# Patient Record
Sex: Female | Born: 1950 | Race: White | Hispanic: No | Smoking: Never smoker
Health system: Southern US, Community
[De-identification: ages and names within clinical notes are randomized; demographics above are authoritative.]

## PROBLEM LIST (undated history)

## (undated) DIAGNOSIS — D241 Benign neoplasm of right breast: Secondary | ICD-10-CM

## (undated) DIAGNOSIS — M25569 Pain in unspecified knee: Secondary | ICD-10-CM

## (undated) DIAGNOSIS — I480 Paroxysmal atrial fibrillation: Secondary | ICD-10-CM

## (undated) DIAGNOSIS — F341 Dysthymic disorder: Secondary | ICD-10-CM

## (undated) DIAGNOSIS — G43909 Migraine, unspecified, not intractable, without status migrainosus: Secondary | ICD-10-CM

## (undated) DIAGNOSIS — D649 Anemia, unspecified: Secondary | ICD-10-CM

## (undated) DIAGNOSIS — N390 Urinary tract infection, site not specified: Secondary | ICD-10-CM

## (undated) DIAGNOSIS — R002 Palpitations: Secondary | ICD-10-CM

## (undated) DIAGNOSIS — Z9889 Other specified postprocedural states: Secondary | ICD-10-CM

## (undated) DIAGNOSIS — R112 Nausea with vomiting, unspecified: Secondary | ICD-10-CM

## (undated) DIAGNOSIS — E785 Hyperlipidemia, unspecified: Secondary | ICD-10-CM

## (undated) DIAGNOSIS — I10 Essential (primary) hypertension: Secondary | ICD-10-CM

## (undated) DIAGNOSIS — K219 Gastro-esophageal reflux disease without esophagitis: Secondary | ICD-10-CM

## (undated) HISTORY — DX: Migraine, unspecified, not intractable, without status migrainosus: G43.909

## (undated) HISTORY — DX: Anemia, unspecified: D64.9

## (undated) HISTORY — DX: Pain in unspecified knee: M25.569

## (undated) HISTORY — DX: Gastro-esophageal reflux disease without esophagitis: K21.9

## (undated) HISTORY — DX: Essential (primary) hypertension: I10

## (undated) HISTORY — DX: Paroxysmal atrial fibrillation: I48.0

## (undated) HISTORY — DX: Urinary tract infection, site not specified: N39.0

## (undated) HISTORY — DX: Dysthymic disorder: F34.1

## (undated) HISTORY — DX: Hyperlipidemia, unspecified: E78.5

---
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
## 1977-06-12 HISTORY — PX: OTHER SURGICAL HISTORY: SHX169

## 1995-06-13 HISTORY — PX: ABDOMINAL HYSTERECTOMY: SHX81

## 2002-10-17 ENCOUNTER — Emergency Department (HOSPITAL_COMMUNITY): Admission: EM | Admit: 2002-10-17 | Discharge: 2002-10-17 | Payer: Self-pay | Admitting: Emergency Medicine

## 2003-04-14 ENCOUNTER — Ambulatory Visit (HOSPITAL_COMMUNITY): Admission: RE | Admit: 2003-04-14 | Discharge: 2003-04-14 | Payer: Self-pay | Admitting: Podiatry

## 2003-04-14 ENCOUNTER — Ambulatory Visit (HOSPITAL_BASED_OUTPATIENT_CLINIC_OR_DEPARTMENT_OTHER): Admission: RE | Admit: 2003-04-14 | Discharge: 2003-04-14 | Payer: Self-pay | Admitting: Podiatry

## 2003-06-26 ENCOUNTER — Ambulatory Visit (HOSPITAL_BASED_OUTPATIENT_CLINIC_OR_DEPARTMENT_OTHER): Admission: RE | Admit: 2003-06-26 | Discharge: 2003-06-26 | Payer: Self-pay | Admitting: Podiatry

## 2003-08-12 ENCOUNTER — Ambulatory Visit (HOSPITAL_COMMUNITY): Admission: RE | Admit: 2003-08-12 | Discharge: 2003-08-12 | Payer: Self-pay | Admitting: Podiatry

## 2003-08-12 ENCOUNTER — Ambulatory Visit (HOSPITAL_BASED_OUTPATIENT_CLINIC_OR_DEPARTMENT_OTHER): Admission: RE | Admit: 2003-08-12 | Discharge: 2003-08-12 | Payer: Self-pay | Admitting: Podiatry

## 2003-10-10 ENCOUNTER — Encounter: Payer: Self-pay | Admitting: Emergency Medicine

## 2003-10-10 ENCOUNTER — Emergency Department (HOSPITAL_COMMUNITY): Admission: EM | Admit: 2003-10-10 | Discharge: 2003-10-10 | Payer: Self-pay | Admitting: Emergency Medicine

## 2003-12-22 ENCOUNTER — Ambulatory Visit (HOSPITAL_BASED_OUTPATIENT_CLINIC_OR_DEPARTMENT_OTHER): Admission: RE | Admit: 2003-12-22 | Discharge: 2003-12-22 | Payer: Self-pay | Admitting: Podiatry

## 2009-02-18 ENCOUNTER — Observation Stay (HOSPITAL_COMMUNITY): Admission: EM | Admit: 2009-02-18 | Discharge: 2009-02-18 | Payer: Self-pay | Admitting: Emergency Medicine

## 2009-02-21 ENCOUNTER — Emergency Department (HOSPITAL_COMMUNITY): Admission: EM | Admit: 2009-02-21 | Discharge: 2009-02-22 | Payer: Self-pay | Admitting: Emergency Medicine

## 2009-04-14 ENCOUNTER — Ambulatory Visit: Payer: Self-pay | Admitting: Internal Medicine

## 2009-04-14 DIAGNOSIS — F341 Dysthymic disorder: Secondary | ICD-10-CM | POA: Insufficient documentation

## 2009-04-14 DIAGNOSIS — I4891 Unspecified atrial fibrillation: Secondary | ICD-10-CM | POA: Insufficient documentation

## 2009-04-14 HISTORY — DX: Dysthymic disorder: F34.1

## 2009-04-19 ENCOUNTER — Ambulatory Visit: Payer: Self-pay

## 2009-04-19 ENCOUNTER — Ambulatory Visit (HOSPITAL_COMMUNITY): Admission: RE | Admit: 2009-04-19 | Discharge: 2009-04-19 | Payer: Self-pay | Admitting: Internal Medicine

## 2009-04-19 ENCOUNTER — Encounter: Payer: Self-pay | Admitting: Internal Medicine

## 2009-04-19 ENCOUNTER — Ambulatory Visit: Payer: Self-pay | Admitting: Cardiology

## 2009-04-28 ENCOUNTER — Ambulatory Visit: Payer: Self-pay | Admitting: Internal Medicine

## 2009-04-28 LAB — CONVERTED CEMR LAB
BUN: 14 mg/dL (ref 6–23)
CO2: 29 meq/L (ref 19–32)
Calcium: 9 mg/dL (ref 8.4–10.5)
Chloride: 108 meq/L (ref 96–112)
GFR calc non Af Amer: 68.8 mL/min (ref >60)
Glucose, Bld: 100 mg/dL — ABNORMAL HIGH (ref 70–99)
Potassium: 4.1 meq/L (ref 3.5–5.1)
Sodium: 144 meq/L (ref 135–145)

## 2009-05-04 ENCOUNTER — Telehealth: Payer: Self-pay | Admitting: Internal Medicine

## 2009-06-30 ENCOUNTER — Telehealth: Payer: Self-pay | Admitting: Internal Medicine

## 2009-07-02 ENCOUNTER — Ambulatory Visit: Payer: Self-pay | Admitting: Internal Medicine

## 2009-07-02 DIAGNOSIS — G43909 Migraine, unspecified, not intractable, without status migrainosus: Secondary | ICD-10-CM | POA: Insufficient documentation

## 2009-07-02 DIAGNOSIS — I10 Essential (primary) hypertension: Secondary | ICD-10-CM

## 2009-07-02 HISTORY — DX: Essential (primary) hypertension: I10

## 2009-07-02 HISTORY — DX: Migraine, unspecified, not intractable, without status migrainosus: G43.909

## 2009-07-02 LAB — CONVERTED CEMR LAB
Protein, U semiquant: NEGATIVE
Specific Gravity, Urine: <1.005
Urobilinogen, UA: 0.2

## 2009-07-05 ENCOUNTER — Telehealth: Payer: Self-pay | Admitting: Internal Medicine

## 2009-07-19 ENCOUNTER — Telehealth: Payer: Self-pay | Admitting: Internal Medicine

## 2009-09-07 ENCOUNTER — Ambulatory Visit: Payer: Self-pay | Admitting: Internal Medicine

## 2009-09-07 DIAGNOSIS — K219 Gastro-esophageal reflux disease without esophagitis: Secondary | ICD-10-CM

## 2009-09-07 DIAGNOSIS — E785 Hyperlipidemia, unspecified: Secondary | ICD-10-CM

## 2009-09-07 HISTORY — DX: Gastro-esophageal reflux disease without esophagitis: K21.9

## 2009-09-07 HISTORY — DX: Hyperlipidemia, unspecified: E78.5

## 2009-09-07 LAB — CONVERTED CEMR LAB
Basophils Absolute: 0.1 10*3/uL (ref 0.0–0.1)
Basophils Relative: 0.9 % (ref 0.0–3.0)
Bilirubin Urine: NEGATIVE
Cholesterol: 307 mg/dL — ABNORMAL HIGH (ref 0–200)
Direct LDL: 224.5 mg/dL
Eosinophils Absolute: 0.2 10*3/uL (ref 0.0–0.7)
Eosinophils Relative: 2.6 % (ref 0.0–5.0)
HCT: 39.6 % (ref 36.0–46.0)
HDL: 57.3 mg/dL (ref >39.00)
MCHC: 33 g/dL (ref 30.0–36.0)
Monocytes Relative: 7.7 % (ref 3.0–12.0)
Neutro Abs: 3.4 10*3/uL (ref 1.4–7.7)
Neutrophils Relative %: 45.3 % (ref 43.0–77.0)
TSH: 2.01 microintl units/mL (ref 0.35–5.50)
Total CHOL/HDL Ratio: 5
Triglycerides: 196 mg/dL — ABNORMAL HIGH (ref 0.0–149.0)
Urobilinogen, UA: 0.2
VLDL: 39.2 mg/dL (ref 0.0–40.0)
pH: 5

## 2009-12-07 ENCOUNTER — Ambulatory Visit: Payer: Self-pay | Admitting: Internal Medicine

## 2009-12-07 DIAGNOSIS — M25569 Pain in unspecified knee: Secondary | ICD-10-CM

## 2009-12-07 HISTORY — DX: Pain in unspecified knee: M25.569

## 2009-12-07 LAB — CONVERTED CEMR LAB
Albumin: 4.3 g/dL (ref 3.5–5.2)
Alkaline Phosphatase: 107 units/L (ref 39–117)
Bilirubin Urine: NEGATIVE
Blood in Urine, dipstick: NEGATIVE
Direct LDL: 118.9 mg/dL
HDL: 47 mg/dL (ref >39.00)
Nitrite: NEGATIVE
Total CHOL/HDL Ratio: 4
VLDL: 43 mg/dL — ABNORMAL HIGH (ref 0.0–40.0)

## 2009-12-22 ENCOUNTER — Encounter: Payer: Self-pay | Admitting: Internal Medicine

## 2010-03-06 ENCOUNTER — Emergency Department (HOSPITAL_COMMUNITY)
Admission: EM | Admit: 2010-03-06 | Discharge: 2010-03-06 | Payer: Self-pay | Source: Home / Self Care | Admitting: Emergency Medicine

## 2010-07-03 ENCOUNTER — Encounter: Payer: Self-pay | Admitting: Orthopaedic Surgery

## 2010-07-12 NOTE — Letter (Signed)
Summary: [facility]  [facility]   Imported By: Lester Foster Brook 12/27/2009 10:49:44  _____________________________________________________________________  External Attachment:    Type:   Image     Comment:   External Document

## 2010-07-12 NOTE — Progress Notes (Signed)
Summary: Benicar HCT PA  Phone Note From Pharmacy   Caller: [facility] [phone] Call For: ID:  [phone]  Summary of Call: PA request--Benicar HCT. Covered alternatives are Losartan, Micardis, and Diovan. Would you like to change med or initiate PA? Initial call taken by: Lucious Groves,  July 19, 2009 8:28 AM  Follow-up for Phone Call        ok to change to Diovan HCT- see med list - but i did not e-rx new rx -- may send to pt pharmacy as needed - thanks Follow-up by: Newt Lukes MD,  July 19, 2009 9:32 AM    New/Updated Medications: DIOVAN HCT 160-12.5 MG TABS (VALSARTAN-HYDROCHLOROTHIAZIDE) 1 by mouth once daily Prescriptions: DIOVAN HCT 160-12.5 MG TABS (VALSARTAN-HYDROCHLOROTHIAZIDE) 1 by mouth once daily  #30 x 3   Entered and Authorized by:   Newt Lukes MD   Signed by:   Newt Lukes MD on 07/19/2009   Method used:   [facility]        [facility] [address] [phone]* (retail)       [address]       [place], [place]  [address]       Ph: [phone] or [phone]       Fax: [phone]   RxID:   [id number]   Appended Document: Benicar HCT PA Left message on voicemail to call back to office.  Appended Document: Benicar HCT PA Patient notified.

## 2010-07-12 NOTE — Assessment & Plan Note (Signed)
Summary: NEW [employer] PT--PER NIVIDA/DR KLEIN--HYPERTENSION-PKG/OFF-BP:154...   Vital Signs:  Patient profile:   60 year old female Height:      67 inches (170.18 cm) Weight:      194.12 pounds (88.24 kg) O2 Sat:      97 % on Room air Temp:     99.7 degrees F (37.61 degrees C) oral Pulse rate:   62 / minute BP sitting:   152 / 98  (left arm) Cuff size:   large  Vitals Entered By: Orlan Leavens (July 02, 2009 11:05 AM)  O2 Flow:  Room air CC: New patient/ Hypertension/ also pt complainingof UTI sxs/ also want flu shot Is Patient Diabetic? No Pain Assessment Patient in pain? no        Primary Care Provider:  Newt Lukes MD  CC:  New patient/ Hypertension/ also pt complainingof UTI sxs/ also want flu shot.  History of Present Illness: new pt to me and our division - here to est care  1) HTN - new dx -  on medications-- but for these migraine tx, not specifically for high BP or PAF no HA or CP, no edema - but ++FH same  2) depression symptoms -  prev on effexor but stopped 12mos ago (indep, self-direcetd) counselling re-initiated recently for same symptoms - ms. b anthony describes as feeling sad, tearfulness, no energy -  worse with stressors related to separtation and divorce trials  3) concerned about poss UTI symptoms described as urgency, frequency and dysuria - +hx same - aprox 2-3/year -  hydrating self -  no fever, abd pain or flank pain no hx stones  Clinical Review Panels:  Complete Metabolic Panel   Glucose:  100 (04/28/2009)   Sodium:  144 (04/28/2009)   Potassium:  4.1 (04/28/2009)   Chloride:  108 (04/28/2009)   CO2:  29 (04/28/2009)   BUN:  14 (04/28/2009)   Creatinine:  0.9 (04/28/2009)   Calcium:  9.0 (04/28/2009)   Current Medications (verified): 1)  Imitrex 100 Mg Tabs (Sumatriptan Succinate) .... As Needed 2)  Inderal La 120 Mg Xr24h-Cap (Propranolol Hcl) .... Take One Capsule With An 80 Mg Cap 3)  Inderal La 80 Mg Xr24h-Cap  (Propranolol Hcl) .... Take One Capsule Daily With A 120 Mg Capsule 4)  Lotensin 20 Mg Tabs (Benazepril Hcl) .... Take One Tablet Daily 5)  Benazepril Hcl 20 Mg Tabs (Benazepril Hcl) .... Once Daily  Allergies (verified): 1)  ! Demerol 2)  ! Codeine 3)  ! Morphine  Past History:  Past Medical History: Migraine headaches Paroxsymal atrial fibrillation hypertension UTIs Allergies Anxiety/depression  Past Surgical History: Reviewed history from 04/14/2009 and no changes required. appendectomy Hysterectomy Septorhinoplasty  Family History: Reviewed history from 04/14/2009 and no changes required. Family History of Cancer:  hypertension - both parents, paternal side  Social History: Divorced since 2004 - but ongoing legal struggles related to same retired Char[employer] fundraiser, former CCU experience Tobacco Use - No.  Alcohol Use - yes Regular Exercise - no  Review of Systems       see HPI above. I have reviewed all other systems and they were negative.   Physical Exam  General:  alert, well-developed, well-nourished, and cooperative to examination.    Eyes:  vision grossly intact; pupils equal, round and reactive to light.  conjunctiva and lids normal.    Ears:  normal pinnae bilaterally, without erythema, swelling, or tenderness to palpation. TMs clear, without effusion, or cerumen impaction. Hearing grossly normal bilaterally  Mouth:  teeth and gums in good repair; mucous membranes moist, without lesions or ulcers. oropharynx clear without exudate, no erythema.  Lungs:  normal respiratory effort, no intercostal retractions or use of accessory muscles; normal breath sounds bilaterally - no crackles and no wheezes.    Heart:  normal rate, regular rhythm, no murmur, and no rub. BLE without edema. Abdomen:  soft, non-tender, normal bowel sounds, no distention; no masses and no appreciable hepatomegaly or splenomegaly.   Neurologic:  alert & oriented X3 and cranial nerves II-XII symetrically  intact.  strength normal in all extremities, sensation intact to light touch, and gait normal. speech fluent without dysarthria or aphasia; follows commands with good comprehension.  Skin:  no rashes, vesicles, ulcers, or erythema. No nodules or irregularity to palpation.  Psych:  Oriented X3, memory intact for recent and remote, normally interactive, good eye contact, mildly anxious appearing, mildly depressed appearing, but not agitated.      Impression & Recommendations:  Problem # 1:  UTI (ICD-599.0)  +UA, hx same - will tx with cipro now - prev tx with septa with good results - if >3x/yr, send for Ucx and consider uro/gyn eval Orders: UA Dipstick w/o Micro (manual) (28413) Prescription Created Electronically [phone])  Her updated medication list for this problem includes:    Ciprofloxacin Hcl 500 Mg Tabs (Ciprofloxacin hcl) .Marland Kitchen... 1 by mouth two times a day x 7days  Encouraged to push clear liquids, get enough rest, and take acetaminophen as needed.   Problem # 2:  HYPERTENSION (ICD-401.9)  will add norvasc to current tx - noted hx PAF with RVR so cont Bbloc as ongoing for migraines despite psyc symptoms - f/u 6-8 weeks to cont titration and adjustments as needed  The following medications were removed from the medication list:    Benazepril Hcl 20 Mg Tabs (Benazepril hcl) ..... Once daily Her updated medication list for this problem includes:    Inderal La 120 Mg Xr24h-cap (Propranolol hcl) .Marland Kitchen... Take one capsule with an 80 mg cap    Inderal La 80 Mg Xr24h-cap (Propranolol hcl) .Marland Kitchen... Take one capsule daily with a 120 mg capsule    Lotensin 20 Mg Tabs (Benazepril hcl) .Marland Kitchen... Take one tablet daily    Amlodipine Besylate 5 Mg Tabs (Amlodipine besylate) .Marland Kitchen... 1 by mouth once daily  BP today: 152/98 Prior BP: 180/104 (04/14/2009)  Labs Reviewed: K+: 4.1 (04/28/2009) Creat: : 0.9 (04/28/2009)     Problem # 3:  ANXIETY DEPRESSION (ICD-300.4) resume effexor at this time - prev  good control of symptoms on same - risk/benefit and poss SE reviewed - exac by inc in social stressors with legal battle ongoing with exspouse -  cont counseling as re-initiated -  no SI/HI - to call if worse before next visit  Time spent with patient 45 minutes, more than 50% of this time was spent counseling patient on depression and different medical ways to treat her problems (HTN, depression, UTI) as well as need for followup to cont med adjustments ongoing  Complete Medication List: 1)  Imitrex 100 Mg Tabs (Sumatriptan succinate) .... As needed 2)  Inderal La 120 Mg Xr24h-cap (Propranolol hcl) .... Take one capsule with an 80 mg cap 3)  Inderal La 80 Mg Xr24h-cap (Propranolol hcl) .... Take one capsule daily with a 120 mg capsule 4)  Lotensin 20 Mg Tabs (Benazepril hcl) .... Take one tablet daily 5)  Effexor Xr 37.5 Mg Xr24h-cap (Venlafaxine hcl) .Marland Kitchen.. 1 by mouth once daily x  14 days, 2 by mouth once daily  (or as direcetd) 6)  Amlodipine Besylate 5 Mg Tabs (Amlodipine besylate) .Marland Kitchen.. 1 by mouth once daily 7)  Ciprofloxacin Hcl 500 Mg Tabs (Ciprofloxacin hcl) .Marland Kitchen.. 1 by mouth two times a day x 7days  Other Orders: Admin 1st Vaccine ([address]) Flu Vaccine 34yrs + (60454) Flu Vaccine Consent Questions     Do you have a history of severe allergic reactions to this vaccine? no    Any prior history of allergic reactions to egg and/or gelatin? no    Do you have a sensitivity to the preservative Thimersol? no    Do you have a past history of Guillan-Barre Syndrome? no    Do you currently have an acute febrile illness? no    Have you ever had a severe reaction to latex? no    Vaccine information given and explained to patient? yes    Are you currently pregnant? no    Lot Number:[id number]   Exp Date:12/09/2009   Site Given  Left Deltoid IM Flu Vaccine 59yrs + (09811)  Patient Instructions: 1)  it was good to see you today.  2)  new medications today - amlodipine for blood pressure, effexor  for depression and cipro for bladder infection - your prescriptions have been electronically submitted to your pharmacy. Please take as directed. Contact our office if you believe you're having problems with the medication(s).  3)  Please schedule a follow-up appointment in 6-8 weeks, sooner if problems.  come fasting so we can do cholesterol labs Prescriptions: CIPROFLOXACIN HCL 500 MG TABS (CIPROFLOXACIN HCL) 1 by mouth two times a day x 7days  #14 x 0   Entered and Authorized by:   Newt Lukes MD   Signed by:   Newt Lukes MD on 07/02/2009   Method used:   [facility]        [facility] [address] [phone]* (retail)       [address]       [place], [place]  [address]       Ph: [phone] or [phone]       Fax: [phone]   RxID:   [id number] AMLODIPINE BESYLATE 5 MG TABS (AMLODIPINE BESYLATE) 1 by mouth once daily  #30 x 4   Entered and Authorized by:   Newt Lukes MD   Signed by:   Newt Lukes MD on 07/02/2009   Method used:   [facility] to        [facility] [address] [phone]* (retail)       [address]       [place], [place]  [address]       Ph: [phone] or [phone]       Fax: [phone]   RxID:   [id number] EFFEXOR XR 37.5 MG XR24H-CAP (VENLAFAXINE HCL) 1 by mouth once daily x 14 days, 2 by mouth once daily  (or as direcetd)  #60 x 1   Entered and Authorized by:   Newt Lukes MD   Signed by:   Newt Lukes MD on 07/02/2009   Method used:   [facility] to        [facility] [address] [phone]* (retail)       [address]       [place], [place]  [address]  Ph: [phone] or [phone]       Fax: [phone]   RxID:   [id number]  .lbflu   Colonoscopy  Procedure date:  06/12/2006  Findings:      Pt states done @ [facility] gastroenterology Results: Normal   Laboratory Results   Urine Tests    Routine  Urinalysis   Color: yellow Appearance: Clear Glucose: negative   (Normal Range: Negative) Bilirubin: negative   (Normal Range: Negative) Ketone: negative   (Normal Range: Negative) Spec. Gravity: <1.005   (Normal Range: 1.003-1.035) Blood: moderate   (Normal Range: Negative) pH: 5.0   (Normal Range: 5.0-8.0) Protein: negative   (Normal Range: Negative) Urobilinogen: 0.2   (Normal Range: 0-1) Nitrite: negative   (Normal Range: Negative) Leukocyte Esterace: moderate   (Normal Range: Negative)

## 2010-07-12 NOTE — Progress Notes (Signed)
Summary: HA?  Phone Note Call from Patient Call back at Home Phone [phone]   Caller: Patient Summary of Call: pt called stating that she has been having a constant HA since starting Norvasc. Pt says that she tried a similar medication a few years ago that caused the same side effect. pt is requesting alt med Initial call taken by: Margaret Pyle, CMA,  July 05, 2009 4:14 PM  Follow-up for Phone Call        will change to benicar hct - e-rx done  stop amlodipine at this time-  thanks Follow-up by: Newt Lukes MD,  July 05, 2009 4:26 PM  Additional Follow-up for Phone Call Additional follow up Details #1::        pt informed via VM. told to call back with any further questions or concerns. Additional Follow-up by: Margaret Pyle, CMA,  July 06, 2009 8:19 AM    New/Updated Medications: BENICAR HCT 40-12.5 MG TABS (OLMESARTAN MEDOXOMIL-HCTZ) 1 by mouth once daily Prescriptions: BENICAR HCT 40-12.5 MG TABS (OLMESARTAN MEDOXOMIL-HCTZ) 1 by mouth once daily  #30 x 3   Entered and Authorized by:   Newt Lukes MD   Signed by:   Newt Lukes MD on 07/05/2009   Method used:   [facility]ronically to        [facility] [address] [phone]* (retail)       [address]       [place], [place]  [address]       Ph: [phone] or [phone]       Fax: [phone]   RxID:   [id number]

## 2010-07-12 NOTE — Assessment & Plan Note (Signed)
Summary: 3-6 MTH FU--STC   Vital Signs:  Patient profile:   60 year old female Height:      67 inches (170.18 cm) Weight:      204.12 pounds (92.78 kg) O2 Sat:      97 % on Room air Temp:     97.9 degrees F (36.61 degrees C) oral Pulse rate:   65 / minute BP sitting:   110 / 76  (left arm) Cuff size:   large  Vitals Entered By: Orlan Leavens (December 07, 2009 9:06 AM)  O2 Flow:  Room air CC: 3 month follow-up Is Patient Diabetic? No Comments Pt states she think she have UTI. want urine check. also discuss decrease energy levels. Been having some tremors ? anxiety/stress. also want to discuss effexor and prilosec   Primary Care Provider:  Newt Lukes MD  CC:  3 month follow-up.  History of Present Illness: here for 6 week f/u  1) HTN - new dx - new meds begun 07/2009 for same on medications-- but for these migraine tx, not specifically for high BP or PAF no HA or CP, no edema - but ++FH same  2) depression symptoms -  back on effexor - no adv se of same counselling re-initiated for same symptoms - ms. b anthony - but pt stopped going  describes as feeling sad, tearfulness, no energy -  worse with stressors related to separtation and divorce trials feels tremor related to untx anxiety - ?inc med  3) concerned about poss UTI symptoms described as urgency, frequency and dysuria - +hx same - aprox 2-3/year -  hydrating self -  no fever, abd pain or flank pain no hx stones  4)  dyslipidemia - med tx initialted 08/2009 after review of FLP for same- reports compliance with ongoing medical treatment and no changes in medication dose or frequency. denies adverse side effects related to current therapy. no muscle or GI problems  5) r knee pain - onset 2.5 mos ago - precipitated by working on treadmill - initally swollen and "popped" - now no swelling but continued pain with twisting motion -   Clinical Review Panels:  Lipid Management   Cholesterol:  307 (09/07/2009)  HDL (good cholesterol):  57.30 (09/07/2009)  CBC   WBC:  7.6 (09/07/2009)   RBC:  4.58 (09/07/2009)   Hgb:  13.1 (09/07/2009)   Hct:  39.6 (09/07/2009)   Platelets:  244.0 (09/07/2009)   MCV  86.3 (09/07/2009)   MCHC  33.0 (09/07/2009)   RDW  13.9 (09/07/2009)   PMN:  45.3 (09/07/2009)   Lymphs:  43.5 (09/07/2009)   Monos:  7.7 (09/07/2009)   Eosinophils:  2.6 (09/07/2009)   Basophil:  0.9 (09/07/2009)  Complete Metabolic Panel   Glucose:  100 (04/28/2009)   Sodium:  144 (04/28/2009)   Potassium:  4.1 (04/28/2009)   Chloride:  108 (04/28/2009)   CO2:  29 (04/28/2009)   BUN:  14 (04/28/2009)   Creatinine:  0.9 (04/28/2009)   Calcium:  9.0 (04/28/2009)   Current Medications (verified): 1)  Imitrex 100 Mg Tabs (Sumatriptan Succinate) .... As Needed 2)  Inderal La 120 Mg Xr24h-Cap (Propranolol Hcl) .... Take One Capsule With An 80 Mg Cap 3)  Inderal La 80 Mg Xr24h-Cap (Propranolol Hcl) .... Take One Capsule Daily With A 120 Mg Capsule 4)  Lotensin 20 Mg Tabs (Benazepril Hcl) .... Take One Tablet Daily 5)  Diovan Hct 160-12.5 Mg Tabs (Valsartan-Hydrochlorothiazide) .Marland Kitchen.. 1 By Mouth Once Daily 6)  Effexor Xr 150 Mg Xr24h-Cap (Venlafaxine Hcl) .Marland Kitchen.. 1 By Mouth Once Daily 7)  Prilosec Otc 20 Mg Tbec (Omeprazole Magnesium) .Marland Kitchen.. 1 By Mouth Once Daily X 14days, Then As Needed 8)  Lipitor 20 Mg Tabs (Atorvastatin Calcium) .... Take 1 At Bedtime  Allergies (verified): 1)  ! Demerol 2)  ! Codeine 3)  ! Morphine  Past History:  Past medical, surgical, family and social histories (including risk factors) reviewed, and no changes noted (except as noted below).  Past Medical History: Migraine headaches Paroxsymal atrial fibrillation hypertension UTIs Allergies Anxiety/depression  MD roster: cards - Graciela Husbands  Past Surgical History: Reviewed history from 04/14/2009 and no changes required. appendectomy Hysterectomy Septorhinoplasty  Family History: Reviewed history from  09/07/2009 and no changes required. Family History of Cancer:  hypertension - both parents, paternal side mom expired with supranuclear palsy complications  Social History: Reviewed history from 07/02/2009 and no changes required. 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  Alcohol Use - yes Regular Exercise - no  Review of Systems       see HPI above. I have reviewed all other systems and they were negative.   Physical Exam  General:  alert, well-developed, well-nourished, and cooperative to examination.    Lungs:  normal respiratory effort, no intercostal retractions or use of accessory muscles; normal breath sounds bilaterally - no crackles and no wheezes.    Heart:  normal rate, regular rhythm, no murmur, and no rub. BLE without edema. Msk:  right knee: full range of motion, no joint effusion or swelling. no erythema or abnormal warmth. Stable to ligamentous testing. Nontender to palpation. Neurovascularly intact.  Psych:  Oriented X3, memory intact for recent and remote, normally interactive, good eye contact, min anxious appearing, less depressed appearing, and not agitated.      Impression & Recommendations:  Problem # 1:  ANXIETY DEPRESSION (ICD-300.4)  tol effexor well - cont titration up - new e-rx done  exac by inc in social stressors with legal battle ongoing with exspouse -  counseling on hold at this time - no SI/HI - to call if worse before next visit  Time spent with patient 45 minutes, more than 50% of this time was spent counseling patient on depression and different medical ways to treat her problems (dyslipidemia, HTN, depression, UTI) as well as need for followup to cont med adjustments ongoing  Problem # 2:  HYPERLIPIDEMIA (ICD-272.4)  on meds since 08/2009 - recehck FLP and LFTs now to monitor effect of tx - Her updated medication list for this problem includes:    Lipitor 20 Mg  Tabs (Atorvastatin calcium) .Marland Kitchen... Take 1 at bedtime  Orders: TLB-Lipid Panel (80061-LIPID)    HDL:57.30 (09/07/2009)  Chol:307 (09/07/2009)  Trig:196.0 (09/07/2009)  Problem # 3:  KNEE PAIN, RIGHT (ICD-719.46)  exam benign - refer to ortho for further eval and tx same Orders: Orthopedic Surgeon Referral (Ortho Surgeon)  Problem # 4:  HYPERTENSION (ICD-401.9)  Her updated medication list for this problem includes:    Inderal La 120 Mg Xr24h-cap (Propranolol hcl) .Marland Kitchen... Take one capsule with an 80 mg cap    Inderal La 80 Mg Xr24h-cap (Propranolol hcl) .Marland Kitchen... Take one capsule daily with a 120 mg capsule    Lotensin 20 Mg Tabs (Benazepril hcl) .Marland Kitchen... Take one tablet daily    Diovan Hct 160-12.5 Mg Tabs (Valsartan-hydrochlorothiazide) .Marland Kitchen... 1 by mouth once daily  BP today: 110/76  Prior BP: 100/64 (09/07/2009)  Labs Reviewed: K+: 4.1 (04/28/2009) Creat: : 0.9 (04/28/2009)   Chol: 307 (09/07/2009)   HDL: 57.30 (09/07/2009)   TG: 196.0 (09/07/2009)  Complete Medication List: 1)  Imitrex 100 Mg Tabs (Sumatriptan succinate) .... As needed 2)  Inderal La 120 Mg Xr24h-cap (Propranolol hcl) .... Take one capsule with an 80 mg cap 3)  Inderal La 80 Mg Xr24h-cap (Propranolol hcl) .... Take one capsule daily with a 120 mg capsule 4)  Lotensin 20 Mg Tabs (Benazepril hcl) .... Take one tablet daily 5)  Diovan Hct 160-12.5 Mg Tabs (Valsartan-hydrochlorothiazide) .Marland Kitchen.. 1 by mouth once daily 6)  Venlafaxine Hcl 225 Mg Xr24h-tab (Venlafaxine hcl) .Marland Kitchen.. 1 by mouth once daily 7)  Prilosec Otc 20 Mg Tbec (Omeprazole magnesium) .Marland Kitchen.. 1 by mouth once daily x 14days, then as needed 8)  Lipitor 20 Mg Tabs (Atorvastatin calcium) .... Take 1 at bedtime  Other Orders: UA Dipstick w/o Micro (manual) ([address]) TLB-Hepatic/Liver Function Pnl (80076-HEPATIC)  Patient Instructions: 1)  it was good to see you today. 2)  test(s) ordered today - your results will be posted on the phone tree for review in 48-72 hours  from the time of test completion; call [phone] and enter your 9 digit MRN (listed above on this page, just below your name); if any changes need to be made or there are abnormal results, you will be contacted directly.  3)  increase in effexor dose as discussed - your prescription has been electronically submitted to your pharmacy. Please take as directed. Contact our office if you believe you're having problems with the medication(s).  4)  we'll make referral to orthopedics for your knee pain. Our office will contact you regarding this appointment once made.  5)  Please schedule a follow-up appointment in 3-47months, sooner if problems.  6)  stay hydrated! noncarbonated, non caffienated drinks - esp in this het and humidity! Prescriptions: VENLAFAXINE HCL 225 MG XR24H-TAB (VENLAFAXINE HCL) 1 by mouth once daily  #30 x 5   Entered and Authorized by:   Newt Lukes MD   Signed by:   Newt Lukes MD on 12/07/2009   Method used:   [facility] to        [facility] [address] [phone]* (retail)       [address]       [place], [place]  [address]       Ph: [phone] or [phone]       Fax: [phone]   RxID:   [id number]   Laboratory Results   Urine Tests    Routine Urinalysis   Color: yellow Appearance: Hazy Glucose: negative   (Normal Range: Negative) Bilirubin: negative   (Normal Range: Negative) Ketone: negative   (Normal Range: Negative) Spec. Gravity: 1.020   (Normal Range: 1.003-1.035) Blood: negative   (Normal Range: Negative) pH: 5.0   (Normal Range: 5.0-8.0) Protein: negative   (Normal Range: Negative) Urobilinogen: 0.2   (Normal Range: 0-1) Nitrite: negative   (Normal Range: Negative) Leukocyte Esterace: trace   (Normal Range: Negative)

## 2010-07-12 NOTE — Assessment & Plan Note (Signed)
Summary: 6 - 8 WK FU  STC   Vital Signs:  Patient profile:   60 year old Katelyn [name] Height:      67 inches (170.18 cm) Weight:      200.0 pounds (90.91 kg) O2 Sat:      95 % on Room air Temp:     98.9 degrees F (37.17 degrees C) oral Pulse rate:   60 / minute BP sitting:   100 / 64  (left arm) Cuff size:   large  Vitals Entered By: Katelyn Katelyn [name] (September 07, 2009 9:05 AM)  O2 Flow:  Room air CC: 6 week follow-up/ also req to have urine check starting backl having sxs of UTI Is Patient Diabetic? No Pain Assessment Patient in pain? no        Primary Care Provider:  Newt Lukes MD  CC:  6 week follow-up/ also req to have urine check starting backl having sxs of UTI.  History of Present Illness: here for 6 week f/u  1) HTN - new dx - new meds begun 6 weeks ago on medications-- but for these migraine tx, not specifically for high BP or PAF no HA or CP, no edema - but ++FH same  2) depression symptoms -  back on effexor counselling re-initiated recently for same symptoms - Katelyn Katelyn [name] describes as feeling sad, tearfulness, no energy -  worse with stressors related to separtation and divorce trials  3) concerned about poss UTI symptoms described as urgency, frequency and dysuria - +hx same - aprox 2-3/year -  hydrating self -  no fever, abd pain or flank pain no hx stones  4) also c/o congestion after any meal - no sneezing or allg symptoms -  +occ reflux but no cough or trouble swallowing - no CP or burn, occ "lump" in troat sticking even after meals but no regurg of foods  Clinical Review Panels:  Prevention   Last Colonoscopy:  Pt states done @ [facility] Results: Normal (06/12/2006)  Immunizations   Last Flu Vaccine:  Fluvax 3+ (07/02/2009)  Complete Metabolic Panel   Glucose:  100 (04/28/2009)   Sodium:  144 (04/28/2009)   Potassium:  4.1 (04/28/2009)   Chloride:  108 (04/28/2009)   CO2:  29 (04/28/2009)   BUN:  14 (04/28/2009)  Creatinine:  0.9 (04/28/2009)   Calcium:  9.0 (04/28/2009)   Current Medications (verified): 1)  Imitrex 100 Mg Tabs (Sumatriptan Succinate) .... As Needed 2)  Inderal La 120 Mg Xr24h-Cap (Propranolol Hcl) .... Take One Capsule With An 80 Mg Cap 3)  Inderal La 80 Mg Xr24h-Cap (Propranolol Hcl) .... Take One Capsule Daily With A 120 Mg Capsule 4)  Lotensin 20 Mg Tabs (Benazepril Hcl) .... Take One Tablet Daily 5)  Diovan Hct 160-12.5 Mg Tabs (Valsartan-Hydrochlorothiazide) .Marland Kitchen.. 1 By Mouth Once Daily 6)  Effexor Xr 75 Mg Xr24h-Cap (Venlafaxine Hcl) .... Take 1 By Mouth Qd  Allergies (verified): 1)  ! Demerol 2)  ! Codeine 3)  ! Morphine  Past History:  Past Medical History: Migraine headaches Paroxsymal atrial fibrillation hypertension UTIs Allergies Anxiety/depression  MD rooster: cards - klein  Family History: Family History of Cancer:  hypertension - both parents, paternal side mom expired with supranuclear palsy complications  Social History: Reviewed history from 07/02/2009 and no changes required. 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  Alcohol Use - yes Regular Exercise - no  Review of Systems  The patient denies chest pain, syncope, dyspnea on exertion, and peripheral edema.         tremor in UE in AM upon waking only, occ episodes 2-3/week - ?stress related - never during day, does not interfere with ADLs -   Physical Exam  General:  alert, well-developed, well-nourished, and cooperative to examination.    Lungs:  normal respiratory effort, no intercostal retractions or use of accessory muscles; normal breath sounds bilaterally - no crackles and no wheezes.    Heart:  normal rate, regular rhythm, no murmur, and no rub. BLE without edema. Abdomen:  soft, non-tender, normal bowel sounds, no distention; no masses and no appreciable hepatomegaly or splenomegaly.   Psych:  Oriented  X3, memory intact for recent and remote, normally interactive, good eye contact, min anxious appearing, less depressed appearing, and not agitated.      Impression & Recommendations:  Problem # 1:  HYPERTENSION (ICD-401.9) Assessment Improved  Her updated medication list for this problem includes:    Inderal La 120 Mg Xr24h-cap (Propranolol hcl) .Marland Kitchen... Take one capsule with an 80 mg cap    Inderal La 80 Mg Xr24h-cap (Propranolol hcl) .Marland Kitchen... Take one capsule daily with a 120 mg capsule    Lotensin 20 Mg Tabs (Benazepril hcl) .Marland Kitchen... Take one tablet daily    Diovan Hct 160-12.5 Mg Tabs (Valsartan-hydrochlorothiazide) .Marland Kitchen... 1 by mouth once daily  Orders: TLB-Lipid Panel (80061-LIPID)  BP today: 100/64 Prior BP: 152/98 (07/02/2009)  Labs Reviewed: K+: 4.1 (04/28/2009) Creat: : 0.9 (04/28/2009)     Problem # 2:  ANXIETY DEPRESSION (ICD-300.4) Assessment: Improved tol effexor well - cont titration up - new e-rx check labs r/o med dz contrib to symptoms  Orders: TLB-TSH (Thyroid Stimulating Hormone) (84443-TSH) TLB-CBC Platelet - w/Differential (85025-CBCD)  exac by inc in social stressors with legal battle ongoing with exspouse -  cont counseling as re-initiated -  no SI/HI - to call if worse before next visit  Time spent with patient 45 minutes, more than 50% of this time was spent counseling patient on depression and different medical ways to treat her problems (HTN, depression, UTI) as well as need for followup to cont med adjustments ongoing  Problem # 3:  UTI (ICD-599.0)  Her updated medication list for this problem includes:    Ciprofloxacin Hcl 500 Mg Tabs (Ciprofloxacin hcl) .Marland Kitchen... 1 by mouth two times a day x 7 days  Orders: UA Dipstick w/o Micro (manual) (40981) T-Culture, Urine ([id number])  +UA, hx same - send Ucx now to confirm infx will tx with cipro now - prev tx with septa with good results - if >3x/yr, consider uro/gyn eval - pt agrees (hx uro eval in 20s with  urethral dialation)  Encouraged to push clear liquids, get enough rest, and take acetaminophen as needed.   Problem # 4:  ATRIAL FIBRILLATION (ICD-427.31)  Her updated medication list for this problem includes:    Inderal La 120 Mg Xr24h-cap (Propranolol hcl) .Marland Kitchen... Take one capsule with an 80 mg cap    Inderal La 80 Mg Xr24h-cap (Propranolol hcl) .Marland Kitchen... Take one capsule daily with a 120 mg capsule  Problem # 5:  GERD (ICD-530.81)  Her updated medication list for this problem includes:    Prilosec Otc 20 Mg Tbec (Omeprazole magnesium) .Marland Kitchen... 1 by mouth once daily x 14days, then as needed  Complete Medication List: 1)  Imitrex 100 Mg Tabs (Sumatriptan succinate) .... As needed 2)  Inderal La 120 Mg  Xr24h-cap (Propranolol hcl) .... Take one capsule with an 80 mg cap 3)  Inderal La 80 Mg Xr24h-cap (Propranolol hcl) .... Take one capsule daily with a 120 mg capsule 4)  Lotensin 20 Mg Tabs (Benazepril hcl) .... Take one tablet daily 5)  Diovan Hct 160-12.5 Mg Tabs (Valsartan-hydrochlorothiazide) .Marland Kitchen.. 1 by mouth once daily 6)  Effexor Xr 150 Mg Xr24h-cap (Venlafaxine hcl) .Marland Kitchen.. 1 by mouth once daily 7)  Ciprofloxacin Hcl 500 Mg Tabs (Ciprofloxacin hcl) .Marland Kitchen.. 1 by mouth two times a day x 7 days 8)  Prilosec Otc 20 Mg Tbec (Omeprazole magnesium) .Marland Kitchen.. 1 by mouth once daily x 14days, then as needed  Patient Instructions: 1)  it was good to see you today. 2)  test(s) ordered today - your results will be posted on the phone tree for review in 48-72 hours from the time of test completion; call [phone] and enter your 9 digit MRN (listed above on this page, just below your name); if any changes need to be made or there are abnormal results, you will be contacted directly.  3)  will send your urine for cx to confirm or deny recurrent infection - treat emperically with 7 day cipro - if recurrent UTI, will refer to uro for re-evaluation - 4)  increase in effexor dose as discussed - 5)  trial Prilosec OTC  once daily x 14d, then as needed  6)  Please schedule a follow-up appointment in 3-52months, sooner if problems.  Prescriptions: CIPROFLOXACIN HCL 500 MG TABS (CIPROFLOXACIN HCL) 1 by mouth two times a day x 7 days  #14 x 0   Entered and Authorized by:   Katelyn Lukes MD   Signed by:   Katelyn Lukes MD on 09/07/2009   Method used:   Electronically to        [facility] [address] [phone]* (retail)       [address]       [place], [place]  [address]       Ph: [phone] or [phone]       Fax: [phone]   RxID:   [id number] EFFEXOR XR 150 MG XR24H-CAP (VENLAFAXINE HCL) 1 by mouth once daily  #30 x 5   Entered and Authorized by:   Katelyn Lukes MD   Signed by:   Katelyn Lukes MD on 09/07/2009   Method used:   [facility]ron[facility] to        [facility] [address] [phone]* (retail)       [address]       [place], [place]  [address]       Ph: [phone] or [phone]       Fax: [phone]   RxID:   [id number]   Laboratory Results   Urine Tests    Routine Urinalysis   Color: yellow Appearance: Hazy Glucose: negative   (Normal Range: Negative) Bilirubin: negative   (Normal Range: Negative) Ketone: negative   (Normal Range: Negative) Spec. Gravity: 1.025   (Normal Range: 1.003-1.035) Blood: moderate   (Normal Range: Negative) pH: 5.0   (Normal Range: 5.0-8.0) Protein: negative   (Normal Range: Negative) Urobilinogen: 0.2   (Normal Range: 0-1) Nitrite: negative   (Normal Range: Negative) Leukocyte Esterace: moderate   (Normal Range: Negative)

## 2010-07-12 NOTE — Progress Notes (Signed)
Summary:  elevated BP   Phone Note Call from Patient Call back at Home Phone [phone]   Caller: Patient Reason for Call: Talk to Nurse Summary of Call: elevated BP... the last few days, around  176/104 Initial call taken by: Migdalia Dk,  June 30, 2009 2:00 PM  Follow-up for Phone Call        Pt's BP has been over 90 diastolic since Nov. She has a lot of stress in her life and is going to see a counselor soon for antidepressent. Wants to know if she needs to change her meds or be seen or wait and see if the antidepressant works.  Duncan Dull, RN, BSN  June 30, 2009 3:23 PM   Additional Follow-up for Phone Call Additional follow up Details #1::        Per Dr. Graciela Husbands, pt should call PCP for BP management from now on. I will call her Caleen Essex when I return. Duncan Dull, RN, BSN  June 30, 2009 6:53 PM  Pt. called back. I let her know about MD's recomendations.  Pt. states she does not have a PCP. She States she is concern that she does not have a F/U appointment with Dr. Marikay Alar. She also added Dr. Graciela Husbands prescrived a HNT medication.  According to pt B/P has been 176/106 to 156/96 . An appointment was made for pt. to see Dr. Sanda Linger PCP on 07/05/09 at 2:45 PM. Pt. aware. She is okay with date and time. Additional Follow-up by: Ollen Gross, RN, BSN,  July 01, 2009 4:32 PM    Additional Follow-up for Phone Call Additional follow up Details #2::    pt calling back, request call back, Migdalia Dk  July 01, 2009 3:34 PM   Additional Follow-up for Phone Call Additional follow up Details #3:: Details for Additional Follow-up Action Taken:  Spoke with pt. See above note.  Additional Follow-up by: Ollen Gross, RN, BSN,  July 01, 2009 4:34 PM  New/Updated Medications: BENAZEPRIL HCL 20 MG TABS (BENAZEPRIL HCL) once daily

## 2010-08-25 LAB — URINALYSIS, ROUTINE W REFLEX MICROSCOPIC
Bilirubin Urine: NEGATIVE
Hgb urine dipstick: NEGATIVE

## 2010-08-25 LAB — CBC
HCT: 35.6 % — ABNORMAL LOW (ref 36.0–46.0)
MCH: 28.6 pg (ref 26.0–34.0)
MCHC: 33.5 g/dL (ref 30.0–36.0)
MCV: 85.2 fL (ref 78.0–100.0)
Platelets: 233 10*3/uL (ref 150–400)
RBC: 4.18 MIL/uL (ref 3.87–5.11)
WBC: 11.8 10*3/uL — ABNORMAL HIGH (ref 4.0–10.5)

## 2010-08-25 LAB — BASIC METABOLIC PANEL
BUN: 15 mg/dL (ref 6–23)
Chloride: 108 mEq/L (ref 96–112)
Glucose, Bld: 126 mg/dL — ABNORMAL HIGH (ref 70–99)
Potassium: 3.5 mEq/L (ref 3.5–5.1)
Sodium: 141 mEq/L (ref 135–145)

## 2010-08-25 LAB — URINE MICROSCOPIC-ADD ON

## 2010-08-25 LAB — URINE CULTURE: Colony Count: >=100000

## 2010-08-25 LAB — DIFFERENTIAL: Basophils Relative: 0 % (ref 0–1)

## 2010-09-16 LAB — POCT I-STAT, CHEM 8
BUN: 17 mg/dL (ref 6–23)
Calcium, Ion: 1.21 mmol/L (ref 1.12–1.32)
Chloride: 107 mEq/L (ref 96–112)
Creatinine, Ser: 0.7 mg/dL (ref 0.4–1.2)
Glucose, Bld: 98 mg/dL (ref 70–99)
HCT: 37 % (ref 36.0–46.0)
Hemoglobin: 12.6 g/dL (ref 12.0–15.0)
Potassium: 3.5 mEq/L (ref 3.5–5.1)
Sodium: 141 mEq/L (ref 135–145)
TCO2: 24 mmol/L (ref 0–100)

## 2010-09-16 LAB — DIFFERENTIAL
Basophils Relative: 1 % (ref 0–1)
Eosinophils Relative: 1 % (ref 0–5)
Lymphocytes Relative: 22 % (ref 12–46)
Lymphs Abs: 1.5 10*3/uL (ref 0.7–4.0)
Monocytes Absolute: 0.4 10*3/uL (ref 0.1–1.0)
Monocytes Relative: 7 % (ref 3–12)
Neutro Abs: 4.8 10*3/uL (ref 1.7–7.7)
Neutro Abs: 5.3 10*3/uL (ref 1.7–7.7)
Neutrophils Relative %: 65 % (ref 43–77)

## 2010-09-16 LAB — URINALYSIS, ROUTINE W REFLEX MICROSCOPIC
Glucose, UA: NEGATIVE mg/dL
Ketones, ur: NEGATIVE mg/dL
Specific Gravity, Urine: 1.01 (ref 1.005–1.030)
Urobilinogen, UA: 0.2 mg/dL (ref 0.0–1.0)
pH: 6 (ref 5.0–8.0)

## 2010-09-16 LAB — RAPID URINE DRUG SCREEN, HOSP PERFORMED
Amphetamines: POSITIVE — AB
Benzodiazepines: NOT DETECTED
Tetrahydrocannabinol: NOT DETECTED

## 2010-09-16 LAB — POCT CARDIAC MARKERS
CKMB, poc: 1.8 ng/mL (ref 1.0–8.0)
CKMB, poc: <1 ng/mL — ABNORMAL LOW (ref 1.0–8.0)
Myoglobin, poc: 62.4 ng/mL (ref 12–200)
Myoglobin, poc: 80.3 ng/mL (ref 12–200)
Troponin i, poc: <0.05 ng/mL (ref 0.00–0.09)

## 2010-09-16 LAB — CBC
MCHC: 34 g/dL (ref 30.0–36.0)
MCHC: 34.3 g/dL (ref 30.0–36.0)
MCV: 86.1 fL (ref 78.0–100.0)
Platelets: 177 10*3/uL (ref 150–400)
Platelets: 202 10*3/uL (ref 150–400)
RBC: 4.35 MIL/uL (ref 3.87–5.11)
RDW: 13.6 % (ref 11.5–15.5)
WBC: 8.1 10*3/uL (ref 4.0–10.5)

## 2010-09-16 LAB — COMPREHENSIVE METABOLIC PANEL
AST: 24 U/L (ref 0–37)
Albumin: 3.6 g/dL (ref 3.5–5.2)
Calcium: 8.5 mg/dL (ref 8.4–10.5)
Creatinine, Ser: 0.73 mg/dL (ref 0.4–1.2)
GFR calc Af Amer: >60 mL/min (ref >60)

## 2010-09-16 LAB — LIPASE, BLOOD: Lipase: 15 U/L (ref 11–59)

## 2010-09-16 LAB — PREGNANCY, URINE: Preg Test, Ur: NEGATIVE

## 2010-09-16 LAB — URINE MICROSCOPIC-ADD ON

## 2010-09-16 LAB — URINE CULTURE: Colony Count: >=100000

## 2011-01-12 ENCOUNTER — Encounter: Payer: Self-pay | Admitting: Internal Medicine

## 2011-06-15 ENCOUNTER — Ambulatory Visit (INDEPENDENT_AMBULATORY_CARE_PROVIDER_SITE_OTHER): Payer: BC Managed Care – PPO

## 2011-06-15 DIAGNOSIS — N10 Acute tubulo-interstitial nephritis: Secondary | ICD-10-CM

## 2012-01-25 ENCOUNTER — Ambulatory Visit (INDEPENDENT_AMBULATORY_CARE_PROVIDER_SITE_OTHER): Payer: BC Managed Care – PPO | Admitting: Physician Assistant

## 2012-01-25 VITALS — BP 130/76 | HR 52 | Temp 98.0°F | Resp 16 | Ht 67.0 in | Wt 187.0 lb

## 2012-01-25 DIAGNOSIS — K0889 Other specified disorders of teeth and supporting structures: Secondary | ICD-10-CM

## 2012-01-25 DIAGNOSIS — K089 Disorder of teeth and supporting structures, unspecified: Secondary | ICD-10-CM

## 2012-01-25 DIAGNOSIS — K044 Acute apical periodontitis of pulpal origin: Secondary | ICD-10-CM

## 2012-01-25 DIAGNOSIS — K047 Periapical abscess without sinus: Secondary | ICD-10-CM

## 2012-01-25 MED ORDER — MELOXICAM 7.5 MG PO TABS: 7.5000 mg | ORAL_TABLET | Freq: Two times a day (BID) | ORAL | Status: DC | PRN

## 2012-01-25 MED ORDER — AMOXICILLIN 875 MG PO TABS: 875.0000 mg | ORAL_TABLET | Freq: Two times a day (BID) | ORAL | Status: AC

## 2012-01-25 NOTE — Progress Notes (Signed)
  Subjective:    Patient ID: Donald Prose, female    DOB: 09/12/1950, 61 y.o.   MRN: [id number]  HPI  Pt presents to clinic with tooth pain since last night.  She has a bridge on her left lower jaw and has been told that the anterior tooth may cause problems with the bridge.  She is having pain and swelling.  She can not see her dentist until next week.     Review of Systems  Constitutional: Negative for fever and chills.  HENT: Positive for dental problem.        Objective:   Physical Exam  Vitals reviewed. Constitutional: She is oriented to person, place, and time. She appears well-developed and well-nourished.  HENT:  Head: Normocephalic and atraumatic.  Right Ear: External ear normal.  Left Ear: External ear normal.  Mouth/Throat:    Eyes: Conjunctivae are normal.  Pulmonary/Chest: Effort normal.  Neurological: She is alert and oriented to person, place, and time.  Skin: Skin is warm and dry.  Psychiatric: She has a normal mood and affect. Her behavior is normal. Judgment and thought content normal.          Assessment & Plan:   1. Tooth pain  meloxicam (MOBIC) 7.5 MG tablet  2. Tooth infection  amoxicillin (AMOXIL) 875 MG tablet, meloxicam (MOBIC) 7.5 MG tablet   Pt declined pain medication but told her if she changes her mind to let me know. She should use salt water gargles and plan to f/u with her dentist next week.

## 2012-11-15 ENCOUNTER — Encounter (HOSPITAL_COMMUNITY): Payer: Self-pay | Admitting: Emergency Medicine

## 2012-11-15 ENCOUNTER — Emergency Department (INDEPENDENT_AMBULATORY_CARE_PROVIDER_SITE_OTHER)
Admission: EM | Admit: 2012-11-15 | Discharge: 2012-11-15 | Disposition: A | Discharge status: Home / Self Care | Payer: Self-pay | Source: Home / Self Care | Attending: Emergency Medicine | Admitting: Emergency Medicine

## 2012-11-15 DIAGNOSIS — F3289 Other specified depressive episodes: Secondary | ICD-10-CM

## 2012-11-15 DIAGNOSIS — I10 Essential (primary) hypertension: Secondary | ICD-10-CM

## 2012-11-15 DIAGNOSIS — G43909 Migraine, unspecified, not intractable, without status migrainosus: Secondary | ICD-10-CM

## 2012-11-15 DIAGNOSIS — F32A Depression, unspecified: Secondary | ICD-10-CM

## 2012-11-15 DIAGNOSIS — F329 Major depressive disorder, single episode, unspecified: Secondary | ICD-10-CM

## 2012-11-15 MED ORDER — PROPRANOLOL HCL 80 MG PO TABS: 80.0000 mg | ORAL_TABLET | Freq: Three times a day (TID) | ORAL | Status: DC

## 2012-11-15 MED ORDER — FLUOXETINE HCL 20 MG PO TABS: 20.0000 mg | ORAL_TABLET | Freq: Every day | ORAL | Status: DC

## 2012-11-15 MED ORDER — SUMATRIPTAN SUCCINATE 100 MG PO TABS: 100.0000 mg | ORAL_TABLET | Freq: Once | ORAL | Status: DC

## 2012-11-15 MED ORDER — LISINOPRIL-HYDROCHLOROTHIAZIDE 20-12.5 MG PO TABS: 1.0000 | ORAL_TABLET | Freq: Every day | ORAL | Status: DC

## 2012-11-15 NOTE — ED Provider Notes (Signed)
History     CSN: [id number]  Arrival date & time 11/15/12  1458   First MD Initiated Contact with Patient 11/15/12 1651      Chief Complaint  Patient presents with  . Headache    (Consider location/radiation/quality/duration/timing/severity/associated sxs/prior treatment) HPI Comments: Pt reports increase in life stress.  Her ex-husband declared bankruptcy and stopped paying financial obligations to wife 2.5 months ago.  Pt weaned self off of bp, depression and migraine meds since she wasn't going to be able to pay for them anymore.  In last 2 weeks, has had a series of headaches, some migraines, some not like typical migraines.  Thinks headaches are related to increase in bp without meds.  Requests refills on medications/switch to more affordable medicines.   Patient is a 62 y.o. female presenting with headaches. The history is provided by the patient.  Headache Pain location:  Frontal Radiates to:  Does not radiate Onset quality:  Gradual Duration:  1 day Timing:  Constant Progression:  Unchanged Chronicity:  Recurrent Similar to prior headaches: yes   Context: emotional stress   Relieved by:  Prescription medications Worsened by:  Nothing tried Associated symptoms: neck pain   Associated symptoms: no dizziness, no numbness and no sinus pressure     Past Medical History  Diagnosis Date  . ANXIETY DEPRESSION 04/14/2009  . Atrial fibrillation 04/14/2009  . GERD 09/07/2009  . HYPERLIPIDEMIA 09/07/2009  . HYPERTENSION 07/02/2009  . KNEE PAIN, RIGHT 12/07/2009  . MIGRAINE UNSP W/O INTRACT W/O STATUS MIGRAINOSUS 07/02/2009    Past Surgical History  Procedure Laterality Date  . Appendectomy    . Abdominal hysterectomy    . Septorhinoplasty      Family History  Problem Relation Age of Onset  . Hypertension Mother   . Hypertension Father   . Hypertension Paternal Grandmother   . Hypertension Paternal Grandfather   . Cancer Other     History  Substance Use Topics  .  Smoking status: Never Smoker   . Smokeless tobacco: Not on file  . Alcohol Use: No    OB History   Grav Para Term Preterm Abortions TAB SAB Ect Mult Living                  Review of Systems  HENT: Positive for neck pain. Negative for sinus pressure.   Respiratory: Negative for shortness of breath.   Cardiovascular: Negative for chest pain.  Neurological: Positive for headaches. Negative for dizziness and numbness.  Psychiatric/Behavioral: Positive for dysphoric mood.    Allergies  Codeine; Meperidine hcl; and Morphine  Home Medications   Current Outpatient Rx  Name  Route  Sig  Dispense  Refill  . SUMAtriptan (IMITREX) 100 MG tablet   Oral   Take 100 mg by mouth every 2 (two) hours as needed.           Marland Kitchen FLUoxetine (PROZAC) 20 MG tablet   Oral   Take 1 tablet (20 mg total) by mouth daily.   30 tablet   2   . lisinopril-hydrochlorothiazide (PRINZIDE,ZESTORETIC) 20-12.5 MG per tablet   Oral   Take 1 tablet by mouth daily.   30 tablet   2   . propranolol (INDERAL) 80 MG tablet   Oral   Take 1 tablet (80 mg total) by mouth 3 (three) times daily.   90 tablet   2     Start 80mg  qd, then increase dose every 3-7 days u ...   . SUMAtriptan (IMITREX)  100 MG tablet   Oral   Take 1 tablet (100 mg total) by mouth once. May repeat in 2 hours x1 prn. Maximum 200mg  in 24 hours.   10 tablet   0     BP 166/87  Pulse 64  Temp(Src) 98.7 F (37.1 C) (Oral)  Resp 18  SpO2 94%  Physical Exam  Constitutional: She appears well-developed and well-nourished. She does not appear ill.  Crying, emotionally upset  Neck: Normal range of motion. Neck supple. No spinous process tenderness and no muscular tenderness present.  Cardiovascular: Normal rate and regular rhythm.   Pulmonary/Chest: Effort normal and breath sounds normal.  Psychiatric: Her speech is normal and behavior is normal. Judgment and thought content normal. Her mood appears anxious. Cognition and memory are  normal. She exhibits a depressed mood.    ED Course  Procedures (including critical care time)  Labs Reviewed - No data to display No results found.   1. Hypertension   2. Migraine   3. Depression       MDM  [facility].  D/C pt's valsartan/hctz 160/12.5mg , rx lisinopril/hctz 20/12.5mg  # 30 with 2 rf.  Rx prozac 20mg  daily #30, 2 rf; pt was taking 40mg  prior to stopping 2 months ago, and will likely need increased dose in the future. D/C inderal LA 200mg  daily and rx propranolol 80mg  TID #90, 2 rf for migraine prophylaxis.  Rx sumatriptan 100mg  po x1, may repeat in 2 hours x1, max 200mg  q24 hours #10, no rf.        Cathlyn Parsons, NP 11/15/12 2135

## 2012-11-15 NOTE — ED Notes (Signed)
Headaches and blood pressure concerns for a week.  Patient describes a stressful life changes involving divorce, loss of financial support, loss of insurance.  Patient reports weaning self off medications preparing for the time when she would not have any resources.  Has been off medicine for 8-9 weeks. Patient reports bp yesterday of 149/107.   Patient took a lingering inderal dose 120mg  yesterday, and vlasartan/hctz  160mg /12.5mg  last night.  Took imitrex early this am.

## 2012-11-15 NOTE — ED Provider Notes (Signed)
Medical screening examination/treatment/procedure(s) were performed by non-physician practitioner and as supervising physician I was immediately available for consultation/collaboration.  Leslee Home, M.D.  Reuben Likes, MD 11/15/12 [phone]

## 2013-02-27 ENCOUNTER — Ambulatory Visit: Payer: Self-pay | Attending: Family Medicine | Admitting: Internal Medicine

## 2013-02-27 ENCOUNTER — Encounter: Payer: Self-pay | Admitting: Internal Medicine

## 2013-02-27 VITALS — BP 103/72 | HR 61 | Temp 99.2°F | Ht 67.0 in | Wt 199.4 lb

## 2013-02-27 DIAGNOSIS — Z79899 Other long term (current) drug therapy: Secondary | ICD-10-CM | POA: Insufficient documentation

## 2013-02-27 DIAGNOSIS — I10 Essential (primary) hypertension: Secondary | ICD-10-CM | POA: Insufficient documentation

## 2013-02-27 DIAGNOSIS — F329 Major depressive disorder, single episode, unspecified: Secondary | ICD-10-CM

## 2013-02-27 DIAGNOSIS — F3289 Other specified depressive episodes: Secondary | ICD-10-CM | POA: Insufficient documentation

## 2013-02-27 DIAGNOSIS — K219 Gastro-esophageal reflux disease without esophagitis: Secondary | ICD-10-CM | POA: Insufficient documentation

## 2013-02-27 DIAGNOSIS — E785 Hyperlipidemia, unspecified: Secondary | ICD-10-CM | POA: Insufficient documentation

## 2013-02-27 DIAGNOSIS — Z87440 Personal history of urinary (tract) infections: Secondary | ICD-10-CM | POA: Insufficient documentation

## 2013-02-27 DIAGNOSIS — G43909 Migraine, unspecified, not intractable, without status migrainosus: Secondary | ICD-10-CM | POA: Insufficient documentation

## 2013-02-27 MED ORDER — LISINOPRIL-HYDROCHLOROTHIAZIDE 20-12.5 MG PO TABS: 1.0000 | ORAL_TABLET | Freq: Every day | ORAL | Status: DC

## 2013-02-27 MED ORDER — PROPRANOLOL HCL 80 MG PO TABS: 80.0000 mg | ORAL_TABLET | Freq: Three times a day (TID) | ORAL | Status: DC

## 2013-02-27 MED ORDER — FLUOXETINE HCL 20 MG PO TABS: 40.0000 mg | ORAL_TABLET | Freq: Every day | ORAL | Status: DC

## 2013-02-27 MED ORDER — SUMATRIPTAN SUCCINATE 100 MG PO TABS: 100.0000 mg | ORAL_TABLET | Freq: Once | ORAL | Status: DC

## 2013-02-27 NOTE — Progress Notes (Signed)
Patient ID: Donald Prose, female   DOB: [birth year], 62 y.o.   MRN: [id number] PCP:  Standley Dakins, MD    Chief Complaint:  Establish care  HPI: 62 year old female with history of hypertension, depression, migraine headaches and chronic UTIs here to establish care. Patient has not seen a doctor over a year since she lost her insurance. She denies any headache, blurred vision, dizziness, nausea, vomiting, chest pain, palpitations, fever, chills, shortness of breath, abdominal pain, bowel or urinary symptoms. She does report having a UTI and being on low-dose Bactrim given by her urologist. She has history of hypertension and takes lisinopril-HCTZ regularly. She also has history of migraine and is on Imitrex and propranolol which has controlled her symptoms well. She has history of depression and is on Prozac which has helped with her symptoms but she does feel more depressed and stressed out DVT. Denies any change in her weight or appetite. No change in her skin or hair. She denies any suicidal ideations.  Allergies: Allergies  Allergen Reactions  . Codeine   . Meperidine Hcl   . Morphine     Prior to Admission medications   Medication Sig Start Date End Date Taking? Authorizing Provider  FLUoxetine (PROZAC) 20 MG tablet Take 2 tablets (40 mg total) by mouth daily. 02/27/13  Yes Jannice Beitzel, MD  lisinopril-hydrochlorothiazide (PRINZIDE,ZESTORETIC) 20-12.5 MG per tablet Take 1 tablet by mouth daily. 02/27/13  Yes Geordan Xu, MD  propranolol (INDERAL) 80 MG tablet Take 1 tablet (80 mg total) by mouth 3 (three) times daily. 02/27/13  Yes Kataryna Mcquilkin, MD  SUMAtriptan (IMITREX) 100 MG tablet Take 100 mg by mouth every 2 (two) hours as needed.     Yes Historical Provider, MD  SUMAtriptan (IMITREX) 100 MG tablet Take 1 tablet (100 mg total) by mouth once. May repeat in 2 hours x1 prn. Maximum 200mg  in 24 hours. 02/27/13  Yes Eddie North, MD    Past Medical History  Diagnosis Date   . ANXIETY DEPRESSION 04/14/2009  . Atrial fibrillation 04/14/2009  . GERD 09/07/2009  . HYPERLIPIDEMIA 09/07/2009  . HYPERTENSION 07/02/2009  . KNEE PAIN, RIGHT 12/07/2009  . MIGRAINE UNSP W/O INTRACT W/O STATUS MIGRAINOSUS 07/02/2009    Past Surgical History  Procedure Laterality Date  . Appendectomy    . Abdominal hysterectomy    . Septorhinoplasty      Social History:  reports that she has never smoked. She does not have any smokeless tobacco history on file. She reports that she does not drink alcohol or use illicit drugs.  Family History  Problem Relation Age of Onset  . Hypertension Mother   . Cancer Mother   . Hypertension Father   . Cancer Father   . Hypertension Paternal Grandmother   . Hypertension Paternal Grandfather   . Cancer Other     Review of Systems:  Constitutional: Denies fever, chills, diaphoresis, appetite change and fatigue.  HEENT: Denies photophobia, eye pain, redness, hearing loss, ear pain, congestion, sore throat, rhinorrhea, sneezing, mouth sores, trouble swallowing, neck pain, neck stiffness and tinnitus.   Respiratory: Denies SOB, DOE, cough, chest tightness,  and wheezing.   Cardiovascular: Denies chest pain, palpitations and leg swelling.  Gastrointestinal: Denies nausea, vomiting, abdominal pain, diarrhea, constipation, blood in stool and abdominal distention.  Genitourinary: Denies dysuria, urgency, frequency, hematuria, flank pain and difficulty urinating.  Endocrine: Denies: hot or cold intolerance, sweats, changes in hair or nails, polyuria, polydipsia. Musculoskeletal: Denies myalgias, back pain, joint swelling, arthralgias and gait  problem.  Skin: Denies pallor, rash and wound.  Neurological: Denies dizziness, seizures, syncope, weakness, light-headedness, numbness and headaches.  Hematological: Denies adenopathy. Easy bruising, personal or family bleeding history  Psychiatric/Behavioral: Denies suicidal ideation, mood changes, confusion,  nervousness, sleep disturbance and agitation   Physical Exam:  Filed Vitals:   02/27/13 1604  BP: 103/72  Pulse: 61  Temp: 99.2 F (37.3 C)  TempSrc: Oral  Height: 5\' 7"  (1.702 m)  Weight: 199 lb 6.4 oz (90.447 kg)  SpO2: 95%    Constitutional: Vital signs reviewed.  Patient is a well-developed and well-nourished in no acute distress and cooperative with exam.  HEENT: No pallor, no icterus, moist oral mucosa, neck supple, no thyromegaly Chest: Clear to auscultation bilaterally, no added sounds CVS: Normal S1 and S2, no murmurs rub or gallop Abdomen: Soft, nontender, nondistended, bowel sounds present Extremities: Warm, no edema CNS: AAO x3   Labs on Admission:  No results found for this or any previous visit (from the past 48 hour(s)).  Radiological Exams on Admission: No results found.  Assessment/Plan Hypertension Blood pressure is stable. I will refill her medications.  Depression Patient does feel more stressed out lately. I will increase her dose of Prozac to 40 mg daily and have her followup in 2 weeks Check TSH  History of migraine Symptoms well controlled on Inderal which I would continue. Continue sumatriptan as needed  History of chronic UTI Reports to be on oral Bactrim and follows with a urologist  Order for labs for CBC, BMET, TSH, hemoglobin A1c, lipid panel  Referred to GYN for routine Pap and mammogram   follow Up in 2 weeks for lab review and evaluate improvement in symptoms on increased dose of antidepressant    Tajon Moring 02/27/2013, 4:40 PM

## 2013-03-03 ENCOUNTER — Other Ambulatory Visit: Payer: Self-pay

## 2013-03-14 ENCOUNTER — Telehealth: Payer: Self-pay | Admitting: Internal Medicine

## 2013-03-14 ENCOUNTER — Ambulatory Visit: Payer: Self-pay

## 2013-03-14 NOTE — Telephone Encounter (Signed)
Pt came in and asked if we could fix her script  FLUoxetine (PROZAC) 20 MG tablet from tablet to capsule which is less expensive, also pt would like a refill of her medication Trimethoprim 100 mg tab, please contact pt @ [phone]

## 2013-03-17 NOTE — Telephone Encounter (Signed)
Medication refill prozac & trimethoprim

## 2013-04-03 ENCOUNTER — Ambulatory Visit: Payer: Self-pay | Attending: Internal Medicine | Admitting: Internal Medicine

## 2013-04-03 VITALS — BP 142/93 | HR 57 | Temp 98.3°F | Resp 16 | Ht 66.54 in | Wt 208.0 lb

## 2013-04-03 DIAGNOSIS — F341 Dysthymic disorder: Secondary | ICD-10-CM

## 2013-04-03 DIAGNOSIS — I4891 Unspecified atrial fibrillation: Secondary | ICD-10-CM

## 2013-04-03 DIAGNOSIS — G43909 Migraine, unspecified, not intractable, without status migrainosus: Secondary | ICD-10-CM

## 2013-04-03 DIAGNOSIS — I10 Essential (primary) hypertension: Secondary | ICD-10-CM | POA: Insufficient documentation

## 2013-04-03 LAB — BASIC METABOLIC PANEL
Calcium: 9.4 mg/dL (ref 8.4–10.5)
Chloride: 107 mEq/L (ref 96–112)
Glucose, Bld: 95 mg/dL (ref 70–99)
Potassium: 4.1 mEq/L (ref 3.5–5.3)
Sodium: 138 mEq/L (ref 135–145)

## 2013-04-03 LAB — LIPID PANEL
LDL Cholesterol: 151 mg/dL — ABNORMAL HIGH (ref 0–99)
Total CHOL/HDL Ratio: 5.1 Ratio
VLDL: 44 mg/dL — ABNORMAL HIGH (ref 0–40)

## 2013-04-03 LAB — HEMOGLOBIN A1C: Hgb A1c MFr Bld: 5.7 % — ABNORMAL HIGH (ref <5.7)

## 2013-04-03 MED ORDER — LISINOPRIL-HYDROCHLOROTHIAZIDE 20-12.5 MG PO TABS: 1.0000 | ORAL_TABLET | Freq: Every day | ORAL | Status: DC

## 2013-04-03 MED ORDER — SUMATRIPTAN SUCCINATE 100 MG PO TABS: 100.0000 mg | ORAL_TABLET | Freq: Two times a day (BID) | ORAL | Status: DC | PRN

## 2013-04-03 MED ORDER — TRIMETHOPRIM 100 MG PO TABS: 100.0000 mg | ORAL_TABLET | Freq: Two times a day (BID) | ORAL | Status: DC

## 2013-04-03 MED ORDER — FLUOXETINE HCL 20 MG PO TABS: 40.0000 mg | ORAL_TABLET | Freq: Every day | ORAL | Status: DC

## 2013-04-03 MED ORDER — PROPRANOLOL HCL 80 MG PO TABS: 80.0000 mg | ORAL_TABLET | Freq: Three times a day (TID) | ORAL | Status: DC

## 2013-04-03 MED ORDER — FLUOXETINE HCL 40 MG PO CAPS: 40.0000 mg | ORAL_CAPSULE | Freq: Every day | ORAL | Status: DC

## 2013-04-03 MED ORDER — SULFAMETHOXAZOLE-TMP DS 800-160 MG PO TABS: 1.0000 | ORAL_TABLET | Freq: Two times a day (BID) | ORAL | Status: DC

## 2013-04-03 NOTE — Progress Notes (Signed)
Pt is here for a f/u visit. Pt needs a medication review. Pt reports that she has a cyst on her pubic region. Pt still thinks that she has a UTI.

## 2013-04-03 NOTE — Patient Instructions (Signed)

## 2013-04-03 NOTE — Progress Notes (Signed)
Patient ID: Katelyn [name], female   DOB: 1951-05-08, 62 y.o.   MRN: [id number]  CC: follow up  HPI: 62 year old female with past medical history of migraine headaches, hypertension, hypothyroidism who presented to the clinic for follow up. Pt ran out of her medications but reports feeling fine this morning. She has no chest pain, palpitations, shortness of breath.  Allergies  Allergen Reactions  . Codeine   . Meperidine Hcl   . Morphine    Past Medical History  Diagnosis Date  . ANXIETY DEPRESSION 04/14/2009  . Atrial fibrillation 04/14/2009  . GERD 09/07/2009  . HYPERLIPIDEMIA 09/07/2009  . HYPERTENSION 07/02/2009  . KNEE PAIN, RIGHT 12/07/2009  . MIGRAINE UNSP W/O INTRACT W/O STATUS MIGRAINOSUS 07/02/2009   Current Outpatient Prescriptions on File Prior to Visit  Medication Sig Dispense Refill  . [DISCONTINUED] atorvastatin (LIPITOR) 20 MG tablet Take 20 mg by mouth daily.        . [DISCONTINUED] benazepril (LOTENSIN) 20 MG tablet Take 20 mg by mouth daily.        . [DISCONTINUED] omeprazole (PRILOSEC OTC) 20 MG tablet Take 20 mg by mouth daily.        . [DISCONTINUED] valsartan-hydrochlorothiazide (DIOVAN-HCT) 160-12.5 MG per tablet Take 1 tablet by mouth daily.        . [DISCONTINUED] Venlafaxine HCl 225 MG TB24 Take by mouth daily.         No current facility-administered medications on file prior to visit.   Family History  Problem Relation Age of Onset  . Hypertension Mother   . Cancer Mother   . Hypertension Father   . Cancer Father   . Hypertension Paternal Grandmother   . Hypertension Paternal Grandfather   . Cancer Other    History   Social History  . Marital Status: Divorced    Spouse Name: N/A    Number of Children: N/A  . Years of Education: N/A   Occupational History  . Not on file.   Social History Main Topics  . Smoking status: Never Smoker   . Smokeless tobacco: Not on file  . Alcohol Use: No  . Drug Use: No  . Sexual Activity: Not on file    Other Topics Concern  . Not on file   Social History Narrative  . No narrative on file    Review of Systems  Constitutional: Negative for fever, chills, diaphoresis, activity change, appetite change and fatigue.  HENT: Negative for ear pain, nosebleeds, congestion, facial swelling, rhinorrhea, neck pain, neck stiffness and ear discharge.   Eyes: Negative for pain, discharge, redness, itching and visual disturbance.  Respiratory: Negative for cough, choking, chest tightness, shortness of breath, wheezing and stridor.   Cardiovascular: Negative for chest pain, palpitations and leg swelling.  Gastrointestinal: Negative for abdominal distention.  Genitourinary: Negative for dysuria, urgency, frequency, hematuria, flank pain, decreased urine volume, difficulty urinating and dyspareunia.  Musculoskeletal: Negative for back pain, joint swelling, arthralgias and gait problem.  Neurological: Negative for dizziness, tremors, seizures, syncope, facial asymmetry, speech difficulty, weakness, light-headedness, numbness and headaches.  Hematological: Negative for adenopathy. Does not bruise/bleed easily.  Psychiatric/Behavioral: Negative for hallucinations, behavioral problems, confusion, dysphoric mood, decreased concentration and agitation.    Objective:  There were no vitals filed for this visit.  Physical Exam  Constitutional: Appears well-developed and well-nourished. No distress.  HENT: Normocephalic. External right and left ear normal. Oropharynx is clear and moist.  Eyes: Conjunctivae and EOM are normal. PERRLA, no scleral icterus.  Neck:  Normal ROM. Neck supple. No JVD. No tracheal deviation. No thyromegaly.  CVS: RRR, S1/S2 +, no murmurs, no gallops, no carotid bruit.  Pulmonary: Effort and breath sounds normal, no stridor, rhonchi, wheezes, rales.  Abdominal: Soft. BS +,  no distension, tenderness, rebound or guarding.  Musculoskeletal: Normal range of motion. No edema and no  tenderness.  Lymphadenopathy: No lymphadenopathy noted, cervical, inguinal. Neuro: Alert. Normal reflexes, muscle tone coordination. No cranial nerve deficit. Skin: Skin is warm and dry. No rash noted. Not diaphoretic. No erythema. No pallor.  Psychiatric: Normal mood and affect. Behavior, judgment, thought content normal.   Lab Results  Component Value Date   WBC 11.8* 03/06/2010   HGB 11.9* 03/06/2010   HCT 35.6* 03/06/2010   MCV 85.2 03/06/2010   PLT 233 03/06/2010   Lab Results  Component Value Date   CREATININE 0.66 03/06/2010   BUN 15 03/06/2010   NA 141 03/06/2010   K 3.5 03/06/2010   CL 108 03/06/2010   CO2 28 03/06/2010    No results found for this basename: HGBA1C   Lipid Panel     Component Value Date/Time   CHOL 197 12/07/2009 0953   TRIG 215.0* 12/07/2009 0953   HDL 47.00 12/07/2009 0953   CHOLHDL 4 12/07/2009 0953   VLDL 43.0* 12/07/2009 0953       Assessment and plan:   Patient Active Problem List   Diagnosis Date Noted  . MIGRAINE UNSP W/O INTRACT W/O STATUS MIGRAINOSUS 07/02/2009    Priority: Medium - sumatriptan prescription provided  . HYPERTENSION 07/02/2009    Priority: Medium - We have discussed target BP range - I have advised pt to check BP regularly and to call us back if the numbers are higher than 140/90 - discussed the importance of compliance with medical therapy and diet   . ANXIETY DEPRESSION 04/14/2009    Priority: Medium - continue prozac  . Atrial fibrillation 04/14/2009    Priority: Medium - on propranolol

## 2013-04-04 LAB — TSH: TSH: 1.239 u[IU]/mL (ref 0.350–4.500)

## 2013-04-28 ENCOUNTER — Encounter: Payer: Self-pay | Admitting: Obstetrics and Gynecology

## 2013-04-29 ENCOUNTER — Ambulatory Visit: Payer: Self-pay | Attending: Internal Medicine

## 2013-05-19 ENCOUNTER — Ambulatory Visit: Payer: Self-pay | Attending: Internal Medicine | Admitting: Internal Medicine

## 2013-05-19 ENCOUNTER — Telehealth: Payer: Self-pay | Admitting: Internal Medicine

## 2013-05-19 ENCOUNTER — Encounter: Payer: Self-pay | Admitting: Internal Medicine

## 2013-05-19 VITALS — BP 129/69 | HR 66 | Temp 97.7°F | Resp 16 | Wt 202.6 lb

## 2013-05-19 DIAGNOSIS — K029 Dental caries, unspecified: Secondary | ICD-10-CM

## 2013-05-19 DIAGNOSIS — K089 Disorder of teeth and supporting structures, unspecified: Secondary | ICD-10-CM

## 2013-05-19 DIAGNOSIS — K0889 Other specified disorders of teeth and supporting structures: Secondary | ICD-10-CM

## 2013-05-19 MED ORDER — AMOXICILLIN 500 MG PO CAPS: 500.0000 mg | ORAL_CAPSULE | Freq: Three times a day (TID) | ORAL | Status: DC

## 2013-05-19 NOTE — Telephone Encounter (Signed)
Has an abscess on tooth and would like to come in and be

## 2013-05-19 NOTE — Progress Notes (Signed)
MRN: [id number] Name: Katelyn [name]  Sex: female Age: 62 y.o. DOB: [birth year]  Allergies: Codeine; Meperidine hcl; and Morphine  Chief Complaint  Patient presents with  . dental referral    HPI: Patient is 62 y.o. female who comes today reported to have left lower jaw dental pain for the last few days, she has history of dental problems and was seen a dentist in the past ? Had  abscessed tooth, patient is requesting antibiotic, denies any feverchest pain or shortness of breath.  Past Medical History  Diagnosis Date  . ANXIETY DEPRESSION 04/14/2009  . Atrial fibrillation 04/14/2009  . GERD 09/07/2009  . HYPERLIPIDEMIA 09/07/2009  . HYPERTENSION 07/02/2009  . KNEE PAIN, RIGHT 12/07/2009  . MIGRAINE UNSP W/O INTRACT W/O STATUS MIGRAINOSUS 07/02/2009    Past Surgical History  Procedure Laterality Date  . Appendectomy    . Abdominal hysterectomy    . Septorhinoplasty        Medication List       This list is accurate as of: 05/19/13  4:43 PM.  Always use your most recent med list.               amoxicillin 500 MG capsule  Commonly known as:  AMOXIL  Take 1 capsule (500 mg total) by mouth 3 (three) times daily.     FLUoxetine 40 MG capsule  Commonly known as:  PROZAC  Take 1 capsule (40 mg total) by mouth daily.     lisinopril-hydrochlorothiazide 20-12.5 MG per tablet  Commonly known as:  PRINZIDE,ZESTORETIC  Take 1 tablet by mouth daily.     propranolol 80 MG tablet  Commonly known as:  INDERAL  Take 1 tablet (80 mg total) by mouth 3 (three) times daily.     SUMAtriptan 100 MG tablet  Commonly known as:  IMITREX  Take 1 tablet (100 mg total) by mouth 2 (two) times daily as needed.     trimethoprim 100 MG tablet  Commonly known as:  TRIMPEX  Take 1 tablet (100 mg total) by mouth 2 (two) times daily.        Meds ordered this encounter  Medications  . amoxicillin (AMOXIL) 500 MG capsule    Sig: Take 1 capsule (500 mg total) by mouth 3 (three) times daily.     Dispense:  30 capsule    Refill:  0    Immunization History  Administered Date(s) Administered  . Influenza Whole 07/02/2009    History  Substance Use Topics  . Smoking status: Never Smoker   . Smokeless tobacco: Not on file  . Alcohol Use: No    Review of Systems  As noted in HPI  Filed Vitals:   05/19/13 1630  BP: 129/69  Pulse: 66  Temp: 97.7 F (36.5 C)  Resp: 16    Physical Exam  Physical Exam  HENT:  Left lower jaw premolar tooth cavity minimal erythema no discharge  Cardiovascular: Normal rate and regular rhythm.   Pulmonary/Chest: Breath sounds normal. No respiratory distress. She has no wheezes. She has no rales.    CBC    Component Value Date/Time   WBC 11.8* 03/06/2010 0135   RBC 4.18 03/06/2010 0135   HGB 11.9* 03/06/2010 0135   HCT 35.6* 03/06/2010 0135   PLT 233 03/06/2010 0135   MCV 85.2 03/06/2010 0135   LYMPHSABS 2.2 03/06/2010 0135   MONOABS 0.5 03/06/2010 0135   EOSABS 0.2 03/06/2010 0135   BASOSABS 0.0 03/06/2010 0135    CMP  Component Value Date/Time   NA 138 04/03/2013 1215   K 4.1 04/03/2013 1215   CL 107 04/03/2013 1215   CO2 28 04/03/2013 1215   GLUCOSE 95 04/03/2013 1215   BUN 16 04/03/2013 1215   CREATININE 0.94 04/03/2013 1215   CREATININE 0.66 03/06/2010 0135   CALCIUM 9.4 04/03/2013 1215   PROT 7.4 12/07/2009 0953   ALBUMIN 4.3 12/07/2009 0953   AST 20 12/07/2009 0953   ALT 21 12/07/2009 0953   ALKPHOS 107 12/07/2009 0953   BILITOT 0.6 12/07/2009 0953   GFRNONAA >60 03/06/2010 0135   GFRAA  Value: >60        The eGFR has been calculated using the MDRD equation. This calculation has not been validated in all clinical situations. eGFR's persistently <60 mL/min signify possible Chronic Kidney Disease. 03/06/2010 0135    Lab Results  Component Value Date/Time   CHOL 243* 04/03/2013 12:15 PM    No components found with this basename: hga1c    Lab Results  Component Value Date/Time   AST 20 12/07/2009  9:53 AM     Assessment and Plan  Pain, dental - Plan: amoxicillin (AMOXIL) 500 MG capsule, Ambulatory referral to Dentistry  Dental cavities - Plan: Ambulatory referral to Dentistry   Doris Cheadle, MD

## 2013-05-19 NOTE — Progress Notes (Signed)
Patient here for abscess to bottom left side of mouth States needs antibiotic and referral to dentist

## 2013-05-21 MED ORDER — AMOXICILLIN 500 MG PO CAPS: 500.0000 mg | ORAL_CAPSULE | Freq: Three times a day (TID) | ORAL | Status: DC

## 2013-05-28 ENCOUNTER — Encounter: Payer: Self-pay | Admitting: Obstetrics and Gynecology

## 2013-08-06 ENCOUNTER — Ambulatory Visit (HOSPITAL_COMMUNITY)
Admission: RE | Admit: 2013-08-06 | Discharge: 2013-08-06 | Disposition: A | Discharge status: Home / Self Care | Payer: Self-pay | Source: Ambulatory Visit | Attending: Internal Medicine | Admitting: Internal Medicine

## 2013-08-06 ENCOUNTER — Encounter: Payer: Self-pay | Admitting: Internal Medicine

## 2013-08-06 ENCOUNTER — Ambulatory Visit: Payer: Self-pay | Attending: Internal Medicine | Admitting: Internal Medicine

## 2013-08-06 VITALS — BP 153/90 | HR 56 | Temp 98.8°F | Resp 16 | Ht 67.0 in | Wt 204.0 lb

## 2013-08-06 DIAGNOSIS — R059 Cough, unspecified: Secondary | ICD-10-CM | POA: Insufficient documentation

## 2013-08-06 DIAGNOSIS — R062 Wheezing: Secondary | ICD-10-CM | POA: Insufficient documentation

## 2013-08-06 DIAGNOSIS — Z09 Encounter for follow-up examination after completed treatment for conditions other than malignant neoplasm: Secondary | ICD-10-CM | POA: Insufficient documentation

## 2013-08-06 DIAGNOSIS — G43909 Migraine, unspecified, not intractable, without status migrainosus: Secondary | ICD-10-CM | POA: Insufficient documentation

## 2013-08-06 DIAGNOSIS — I4891 Unspecified atrial fibrillation: Secondary | ICD-10-CM

## 2013-08-06 DIAGNOSIS — E785 Hyperlipidemia, unspecified: Secondary | ICD-10-CM | POA: Insufficient documentation

## 2013-08-06 DIAGNOSIS — F411 Generalized anxiety disorder: Secondary | ICD-10-CM | POA: Insufficient documentation

## 2013-08-06 DIAGNOSIS — K219 Gastro-esophageal reflux disease without esophagitis: Secondary | ICD-10-CM | POA: Insufficient documentation

## 2013-08-06 DIAGNOSIS — F3289 Other specified depressive episodes: Secondary | ICD-10-CM | POA: Insufficient documentation

## 2013-08-06 DIAGNOSIS — F329 Major depressive disorder, single episode, unspecified: Secondary | ICD-10-CM | POA: Insufficient documentation

## 2013-08-06 DIAGNOSIS — K449 Diaphragmatic hernia without obstruction or gangrene: Secondary | ICD-10-CM | POA: Insufficient documentation

## 2013-08-06 DIAGNOSIS — R05 Cough: Secondary | ICD-10-CM | POA: Insufficient documentation

## 2013-08-06 DIAGNOSIS — J9801 Acute bronchospasm: Secondary | ICD-10-CM | POA: Insufficient documentation

## 2013-08-06 DIAGNOSIS — I10 Essential (primary) hypertension: Secondary | ICD-10-CM | POA: Insufficient documentation

## 2013-08-06 MED ORDER — ALBUTEROL SULFATE HFA 108 (90 BASE) MCG/ACT IN AERS: 2.0000 | INHALATION_SPRAY | Freq: Four times a day (QID) | RESPIRATORY_TRACT | Status: DC | PRN

## 2013-08-06 MED ORDER — PROPRANOLOL HCL 80 MG PO TABS: 80.0000 mg | ORAL_TABLET | Freq: Three times a day (TID) | ORAL | Status: DC

## 2013-08-06 MED ORDER — IPRATROPIUM-ALBUTEROL 0.5-2.5 (3) MG/3ML IN SOLN
3.0000 mL | Freq: Once | RESPIRATORY_TRACT | Status: AC
  Administered 2013-08-06: 3 mL via RESPIRATORY_TRACT

## 2013-08-06 MED ORDER — PREDNISONE 10 MG PO TABS: 10.0000 mg | ORAL_TABLET | Freq: Every day | ORAL | Status: DC

## 2013-08-06 MED ORDER — SUMATRIPTAN SUCCINATE 100 MG PO TABS: 100.0000 mg | ORAL_TABLET | Freq: Two times a day (BID) | ORAL | Status: DC | PRN

## 2013-08-06 MED ORDER — LISINOPRIL-HYDROCHLOROTHIAZIDE 20-12.5 MG PO TABS: 1.0000 | ORAL_TABLET | Freq: Every day | ORAL | Status: DC

## 2013-08-06 NOTE — Progress Notes (Signed)
Pt is here with c.o cough x 10 dys unrelieved by OTC Mucinex,Ibuprofen and Benadryl States she is starting to wheeze with worsening prod cough at night Denies sob or CP Expiratory wheezing throughout Sats 94-95% r/a

## 2013-08-06 NOTE — Patient Instructions (Signed)
Bronchospasm, Adult °A bronchospasm is when the tubes that carry air in and out of your lungs (airwarys) spasm or tighten. During a bronchospasm it is hard to breathe. This is because the airways get smaller. A bronchospasm can be triggered by: °· Allergies. These may be to animals, pollen, food, or mold. °· Infection. This is a common cause of bronchospasm. °· Exercise. °· Irritants. These include pollution, cigarette smoke, strong odors, aerosol sprays, and paint fumes. °· Weather changes. °· Stress. °· Being emotional. °HOME CARE  °· Always have a plan for getting help. Know when to call your doctor and local emergency services (911 in the [place] Know where you can get emergency care. °· Only take medicines as told by your doctor. °· If you were prescribed an inhaler or nebulizer machine, ask your doctor how to use it correctly. Always use a spacer with your inhaler if you were given one. °· Stay calm during an attack. Try to relax and breathe more slowly. °· Control your home environment: °· Change your heating and air conditioning filter at least once a month. °· Limit your use of fireplaces and wood stoves. °· Do not  smoke. Do not  allow smoking in your home. °· Avoid perfumes and fragrances. °· Get rid of pests (such as roaches and mice) and their droppings. °· Throw away plants if you see mold on them. °· Keep your house clean and dust free. °· Replace carpet with wood, tile, or vinyl flooring. Carpet can trap dander and dust. °· Use allergy-proof pillows, mattress covers, and box spring covers. °· Wash bed sheets and blankets every week in hot water. Dry them in a dryer. °· Use blankets that are made of polyester or cotton. °· Wash hands frequently. °GET HELP IF: °· You have muscle aches. °· You have chest pain. °· The thick spit you spit or cough up (sputum) changes from clear or white to yellow, green, gray, or bloody. °· The thick spit you spit or cough up gets thicker. °· There are problems that may be  related to the medicine you are given such as: °· A rash. °· Itching. °· Swelling. °· Trouble breathing. °GET HELP RIGHT AWAY IF: °· You feel you cannot breathe or catch your breath. °· You cannot stop coughing. °· Your treatment is not helping you breathe better. °MAKE SURE YOU:  °· Understand these instructions. °· Will watch your condition. °· Will get help right away if you are not doing well or get worse. °Document Released: 03/26/2009 Document Revised: 01/29/2013 Document Reviewed: 11/19/2012 °[employer]® Patient Information ©2014 [employer]. ° °

## 2013-08-06 NOTE — Progress Notes (Signed)
Patient ID: Katelyn [name], female   DOB: July 03, 1950, 63 y.o.   MRN: [id number]   Lorra Freeman, is a 63 y.o. female  GUR:[id number]  EGB:[id number]  DOB - 09-Dec-1950  Chief Complaint  Patient presents with  . Follow-up  . Cough  . Wheezing        Subjective:   Katelyn [name] is a 63 y.o. female here today for a follow up visit. Pt is here as a walk in with c.o cough x 10 dys unrelieved by OTC Mucinex,Ibuprofen and Benadryl. States she is starting to wheeze with worsening prod cough at night. Denies sob or CP. When she arrived, oxygen Sats was 94-95% r/a. She does not have history of asthma or COPD, never smoker. Her medical history include hypertension, migraine headache, dyslipidemia, atrial fibrillation (from record), and generalized anxiety and depression. She does not have any inhaler. Cough is not productive, no contact with somebody with chronic cough. She has no allergies. But she has 2 cats at home but they have lived together for a while Patient has No headache, No chest pain, No abdominal pain - No Nausea, No new weakness tingling or numbness, No Cough - SOB.  Problem  Wheezing  Essential Hypertension, Benign  Migraine    ALLERGIES: Allergies  Allergen Reactions  . Codeine   . Meperidine Hcl   . Morphine     PAST MEDICAL HISTORY: Past Medical History  Diagnosis Date  . ANXIETY DEPRESSION 04/14/2009  . Atrial fibrillation 04/14/2009  . GERD 09/07/2009  . HYPERLIPIDEMIA 09/07/2009  . HYPERTENSION 07/02/2009  . KNEE PAIN, RIGHT 12/07/2009  . MIGRAINE UNSP W/O INTRACT W/O STATUS MIGRAINOSUS 07/02/2009    MEDICATIONS AT HOME: Prior to Admission medications   Medication Sig Start Date End Date Taking? Authorizing Provider  lisinopril-hydrochlorothiazide (PRINZIDE,ZESTORETIC) 20-12.5 MG per tablet Take 1 tablet by mouth daily. 08/06/13  Yes Angelica Chessman, MD  propranolol (INDERAL) 80 MG tablet Take 1 tablet (80 mg total) by mouth 3 (three) times daily. 08/06/13  Yes  Angelica Chessman, MD  SUMAtriptan (IMITREX) 100 MG tablet Take 1 tablet (100 mg total) by mouth 2 (two) times daily as needed. 08/06/13  Yes Angelica Chessman, MD  trimethoprim (TRIMPEX) 100 MG tablet Take 1 tablet (100 mg total) by mouth 2 (two) times daily. 04/03/13  Yes Robbie Lis, MD  albuterol (PROVENTIL HFA;VENTOLIN HFA) 108 (90 BASE) MCG/ACT inhaler Inhale 2 puffs into the lungs every 6 (six) hours as needed for wheezing or shortness of breath. 08/06/13   Angelica Chessman, MD  amoxicillin (AMOXIL) 500 MG capsule Take 1 capsule (500 mg total) by mouth 3 (three) times daily. 05/21/13   Angelica Chessman, MD  predniSONE (DELTASONE) 10 MG tablet Take 1 tablet (10 mg total) by mouth daily with breakfast. 08/06/13   Angelica Chessman, MD     Objective:   Filed Vitals:   08/06/13 1448  BP: 153/90  Pulse: 56  Temp: 98.8 F (37.1 C)  TempSrc: Oral  Resp: 16  Height: 5\' 7"  (1.702 m)  Weight: 204 lb (92.534 kg)  SpO2: 94%    Exam General appearance : Awake, alert, not in any distress. Speech Clear. Not toxic looking HEENT: Atraumatic and Normocephalic, pupils equally reactive to light and accomodation Neck: supple, no JVD. No cervical lymphadenopathy.  Chest: Few scattered inspiratory and expiratory wheezes, significantly improved after nebulization with albuterol followed by Good air entry bilaterally, no added sounds  CVS: S1 S2 regular, no murmurs.  Abdomen: Bowel sounds present, Non tender  and not distended with no gaurding, rigidity or rebound. Extremities: B/L Lower Ext shows no edema, both legs are warm to touch Neurology: Awake alert, and oriented X 3, CN II-XII intact, Non focal Skin:No Rash Wounds:N/A  Data Review  Assessment & Plan   1. Wheezing Prescribe - ipratropium-albuterol (DUONEB) 0.5-2.5 (3) MG/3ML nebulizer solution 3 mL; Take 3 mLs by nebulization once. - DG Chest 2 View; Future - predniSONE (DELTASONE) 10 MG tablet; Take 1 tablet (10 mg total) by mouth  daily with breakfast.  Dispense: 5 tablet; Refill: 0 - albuterol (PROVENTIL HFA;VENTOLIN HFA) 108 (90 BASE) MCG/ACT inhaler; Inhale 2 puffs into the lungs every 6 (six) hours as needed for wheezing or shortness of breath.  Dispense: 1 Inhaler; Refill: 2  2. Essential hypertension, benign Refill - lisinopril-hydrochlorothiazide (PRINZIDE,ZESTORETIC) 20-12.5 MG per tablet; Take 1 tablet by mouth daily.  Dispense: 90 tablet; Refill: 3  3. Atrial fibrillation Refill - propranolol (INDERAL) 80 MG tablet; Take 1 tablet (80 mg total) by mouth 3 (three) times daily.  Dispense: 270 tablet; Refill: 3  4. Migraine Refill - SUMAtriptan (IMITREX) 100 MG tablet; Take 1 tablet (100 mg total) by mouth 2 (two) times daily as needed.  Dispense: 45 tablet; Refill: 3  Return in about 3 months (around 11/03/2013) for Routine Follow Up.  The patient was given clear instructions to go to ER or return to medical center if symptoms don't improve, worsen or new problems develop. The patient verbalized understanding. The patient was told to call to get lab results if they haven't heard anything in the next week.    Angelica Chessman, MD, [place], [place], [place] and [place], [place]   08/06/2013, 3:15 PM

## 2013-08-08 ENCOUNTER — Telehealth: Payer: Self-pay | Admitting: *Deleted

## 2013-08-08 NOTE — Telephone Encounter (Signed)
Message copied by Joan Mayans on Fri Aug 08, 2013 12:55 PM ------      Message from: Angelica Chessman E      Created: Thu Aug 07, 2013  5:06 PM       Please inform patient that her chest x-ray is normal. There is no evidence of pneumonia. Return to clinic if wheezing persists ------

## 2013-08-08 NOTE — Telephone Encounter (Signed)
I informed the patient that her chest x-ray is normal. There is no evidence of pneumonia. Return to clinic if wheezing persists

## 2013-08-11 ENCOUNTER — Other Ambulatory Visit: Payer: Self-pay | Admitting: *Deleted

## 2013-08-11 DIAGNOSIS — R062 Wheezing: Secondary | ICD-10-CM

## 2013-08-11 MED ORDER — ALBUTEROL SULFATE HFA 108 (90 BASE) MCG/ACT IN AERS: 2.0000 | INHALATION_SPRAY | Freq: Four times a day (QID) | RESPIRATORY_TRACT | Status: DC | PRN

## 2013-08-12 ENCOUNTER — Other Ambulatory Visit: Payer: Self-pay | Admitting: *Deleted

## 2013-08-12 DIAGNOSIS — G43909 Migraine, unspecified, not intractable, without status migrainosus: Secondary | ICD-10-CM

## 2013-08-12 DIAGNOSIS — R062 Wheezing: Secondary | ICD-10-CM

## 2013-08-12 DIAGNOSIS — I10 Essential (primary) hypertension: Secondary | ICD-10-CM

## 2013-08-12 DIAGNOSIS — I4891 Unspecified atrial fibrillation: Secondary | ICD-10-CM

## 2013-08-12 MED ORDER — PROPRANOLOL HCL 80 MG PO TABS: 80.0000 mg | ORAL_TABLET | Freq: Three times a day (TID) | ORAL | Status: DC

## 2013-08-12 MED ORDER — LISINOPRIL-HYDROCHLOROTHIAZIDE 20-12.5 MG PO TABS: 1.0000 | ORAL_TABLET | Freq: Every day | ORAL | Status: DC

## 2013-08-12 MED ORDER — PREDNISONE 10 MG PO TABS: 10.0000 mg | ORAL_TABLET | Freq: Every day | ORAL | Status: DC

## 2013-08-12 MED ORDER — SUMATRIPTAN SUCCINATE 100 MG PO TABS: 100.0000 mg | ORAL_TABLET | Freq: Two times a day (BID) | ORAL | Status: DC | PRN

## 2013-08-12 MED ORDER — ALBUTEROL SULFATE HFA 108 (90 BASE) MCG/ACT IN AERS: 2.0000 | INHALATION_SPRAY | Freq: Four times a day (QID) | RESPIRATORY_TRACT | Status: DC | PRN

## 2013-10-14 ENCOUNTER — Telehealth: Payer: Self-pay | Admitting: Internal Medicine

## 2013-10-14 ENCOUNTER — Telehealth: Payer: Self-pay | Admitting: Emergency Medicine

## 2013-10-14 NOTE — Telephone Encounter (Signed)
Left message for pt to return call when message received  

## 2013-10-14 NOTE — Telephone Encounter (Signed)
Pt called stating that she is having some wheezing and would like to speak to a nurse, Please contact pt

## 2013-10-17 ENCOUNTER — Encounter: Payer: Self-pay | Admitting: Internal Medicine

## 2013-10-17 ENCOUNTER — Ambulatory Visit: Payer: Self-pay | Attending: Internal Medicine | Admitting: Internal Medicine

## 2013-10-17 VITALS — BP 121/83 | HR 76 | Temp 98.3°F | Resp 14

## 2013-10-17 DIAGNOSIS — K219 Gastro-esophageal reflux disease without esophagitis: Secondary | ICD-10-CM | POA: Insufficient documentation

## 2013-10-17 DIAGNOSIS — I4891 Unspecified atrial fibrillation: Secondary | ICD-10-CM | POA: Insufficient documentation

## 2013-10-17 DIAGNOSIS — Z79899 Other long term (current) drug therapy: Secondary | ICD-10-CM | POA: Insufficient documentation

## 2013-10-17 DIAGNOSIS — G43909 Migraine, unspecified, not intractable, without status migrainosus: Secondary | ICD-10-CM | POA: Insufficient documentation

## 2013-10-17 DIAGNOSIS — J45901 Unspecified asthma with (acute) exacerbation: Secondary | ICD-10-CM | POA: Insufficient documentation

## 2013-10-17 DIAGNOSIS — E785 Hyperlipidemia, unspecified: Secondary | ICD-10-CM | POA: Insufficient documentation

## 2013-10-17 DIAGNOSIS — I10 Essential (primary) hypertension: Secondary | ICD-10-CM | POA: Insufficient documentation

## 2013-10-17 DIAGNOSIS — J301 Allergic rhinitis due to pollen: Secondary | ICD-10-CM | POA: Insufficient documentation

## 2013-10-17 DIAGNOSIS — J309 Allergic rhinitis, unspecified: Secondary | ICD-10-CM | POA: Insufficient documentation

## 2013-10-17 DIAGNOSIS — F341 Dysthymic disorder: Secondary | ICD-10-CM | POA: Insufficient documentation

## 2013-10-17 MED ORDER — CETIRIZINE HCL 10 MG PO TABS: 10.0000 mg | ORAL_TABLET | Freq: Every day | ORAL | Status: DC

## 2013-10-17 MED ORDER — FLUTICASONE PROPIONATE HFA 110 MCG/ACT IN AERO: 2.0000 | INHALATION_SPRAY | Freq: Two times a day (BID) | RESPIRATORY_TRACT | Status: DC

## 2013-10-17 MED ORDER — AMLODIPINE BESYLATE 10 MG PO TABS: 10.0000 mg | ORAL_TABLET | Freq: Every day | ORAL | Status: DC

## 2013-10-17 NOTE — Patient Instructions (Signed)
Asthma, Adult Asthma is a recurring condition in which the airways tighten and narrow. Asthma can make it difficult to breathe. It can cause coughing, wheezing, and shortness of breath. Asthma episodes (also called asthma attacks) range from minor to life-threatening. Asthma cannot be cured, but medicines and lifestyle changes can help control it. CAUSES Asthma is believed to be caused by inherited (genetic) and environmental factors, but its exact cause is unknown. Asthma may be triggered by allergens, lung infections, or irritants in the air. Asthma triggers are different for each person. Common triggers include:   Animal dander.  Dust mites.  Cockroaches.  Pollen from trees or grass.  Mold.  Smoke.  Air pollutants such as dust, household cleaners, hair sprays, aerosol sprays, paint fumes, strong chemicals, or strong odors.  Cold air, weather changes, and winds (which increase molds and pollens in the air).  Strong emotional expressions such as crying or laughing hard.  Stress.  Certain medicines (such as aspirin) or types of drugs (such as beta-blockers).  Sulfites in foods and drinks. Foods and drinks that may contain sulfites include dried fruit, potato chips, and sparkling grape juice.  Infections or inflammatory conditions such as the flu, a cold, or an inflammation of the nasal membranes (rhinitis).  Gastroesophageal reflux disease (GERD).  Exercise or strenuous activity. SYMPTOMS Symptoms may occur immediately after asthma is triggered or many hours later. Symptoms include:  Wheezing.  Excessive nighttime or early morning coughing.  Frequent or severe coughing with a common cold.  Chest tightness.  Shortness of breath. DIAGNOSIS  The diagnosis of asthma is made by a review of your medical history and a physical exam. Tests may also be performed. These may include:  Lung function studies. These tests show how much air you breath in and out.  Allergy  tests.  Imaging tests such as X-rays. TREATMENT  Asthma cannot be cured, but it can usually be controlled. Treatment involves identifying and avoiding your asthma triggers. It also involves medicines. There are 2 classes of medicine used for asthma treatment:   Controller medicines. These prevent asthma symptoms from occurring. They are usually taken every day.  Reliever or rescue medicines. These quickly relieve asthma symptoms. They are used as needed and provide short-term relief. Your health care provider will help you create an asthma action plan. An asthma action plan is a written plan for managing and treating your asthma attacks. It includes a list of your asthma triggers and how they may be avoided. It also includes information on when medicines should be taken and when their dosage should be changed. An action plan may also involve the use of a device called a peak flow meter. A peak flow meter measures how well the lungs are working. It helps you monitor your condition. HOME CARE INSTRUCTIONS   Take medicine as directed by your health care provider. Speak with your health care provider if you have questions about how or when to take the medicines.  Use a peak flow meter as directed by your health care provider. Record and keep track of readings.  Understand and use the action plan to help minimize or stop an asthma attack without needing to seek medical care.  Control your home environment in the following ways to help prevent asthma attacks:  Do not smoke. Avoid being exposed to secondhand smoke.  Change your heating and air conditioning filter regularly.  Limit your use of fireplaces and wood stoves.  Get rid of pests (such as roaches and   mice) and their droppings.  Throw away plants if you see mold on them.  Clean your floors and dust regularly. Use unscented cleaning products.  Try to have someone else vacuum for you regularly. Stay out of rooms while they are being  vacuumed and for a short while afterward. If you vacuum, use a dust mask from a hardware store, a double-layered or microfilter vacuum cleaner bag, or a vacuum cleaner with a HEPA filter.  Replace carpet with wood, tile, or vinyl flooring. Carpet can trap dander and dust.  Use allergy-proof pillows, mattress covers, and box spring covers.  Wash bed sheets and blankets every week in hot water and dry them in a dryer.  Use blankets that are made of polyester or cotton.  Clean bathrooms and kitchens with bleach. If possible, have someone repaint the walls in these rooms with mold-resistant paint. Keep out of the rooms that are being cleaned and painted.  Wash hands frequently. SEEK MEDICAL CARE IF:   You have wheezing, shortness of breath, or a cough even if taking medicine to prevent attacks.  The colored mucus you cough up (sputum) is thicker than usual.  Your sputum changes from clear or white to yellow, green, gray, or bloody.  You have any problems that may be related to the medicines you are taking (such as a rash, itching, swelling, or trouble breathing).  You are using a reliever medicine more than 2 3 times per week.  Your peak flow is still at 50 79% of you personal best after following your action plan for 1 hour. SEEK IMMEDIATE MEDICAL CARE IF:   You seem to be getting worse and are unresponsive to treatment during an asthma attack.  You are short of breath even at rest.  You get short of breath when doing very little physical activity.  You have difficulty eating, drinking, or talking due to asthma symptoms.  You develop chest pain.  You develop a fast heartbeat.  You have a bluish color to your lips or fingernails.  You are lightheaded, dizzy, or faint.  Your peak flow is less than 50% of your personal best.  You have a fever or persistent symptoms for more than 2 3 days.  You have a fever and symptoms suddenly get worse. MAKE SURE YOU:   Understand these  instructions.  Will watch your condition.  Will get help right away if you are not doing well or get worse. Document Released: 05/29/2005 Document Revised: 01/29/2013 Document Reviewed: 12/26/2012 Exit[employer] Patient Information 2014 [employer].  

## 2013-10-17 NOTE — Progress Notes (Signed)
Pt walked in today having sinus and respiratory issues w/ coughing up clear mucus, fatigue and  wheezing

## 2013-10-19 NOTE — Progress Notes (Signed)
Patient ID: Katelyn [name], female   DOB: February 01, 1951, 63 y.o.   MRN: [id number]  CC: sinus and asthma  HPI: Patient presents today for evaluation of upper respiratory symptoms.  Patient reports that she has been having DOE, cough, rhinitis, PND, and runny eyes for the past two weeks.  She denies fever, chills, nausea, and vomiting.  Patient reports a clear mucous production without blood.  She states that her asthma has been worst since the onset of the URI symptoms.  She admits to using her albuterol inhaler twice daily and having nighttime symptoms twice weekly.  She also reports that she has had a bothersome cough since starting lisinopril and she often gets dizzy upon standing from sitting.    Allergies  Allergen Reactions  . Codeine   . Meperidine Hcl   . Morphine    Past Medical History  Diagnosis Date  . ANXIETY DEPRESSION 04/14/2009  . Atrial fibrillation 04/14/2009  . GERD 09/07/2009  . HYPERLIPIDEMIA 09/07/2009  . HYPERTENSION 07/02/2009  . KNEE PAIN, RIGHT 12/07/2009  . MIGRAINE UNSP W/O INTRACT W/O STATUS MIGRAINOSUS 07/02/2009   Current Outpatient Prescriptions on File Prior to Visit  Medication Sig Dispense Refill  . albuterol (PROVENTIL HFA;VENTOLIN HFA) 108 (90 BASE) MCG/ACT inhaler Inhale 2 puffs into the lungs every 6 (six) hours as needed for wheezing or shortness of breath.  1 Inhaler  2  . amoxicillin (AMOXIL) 500 MG capsule Take 1 capsule (500 mg total) by mouth 3 (three) times daily.  30 capsule  0  . predniSONE (DELTASONE) 10 MG tablet Take 1 tablet (10 mg total) by mouth daily with breakfast.  5 tablet  0  . propranolol (INDERAL) 80 MG tablet Take 1 tablet (80 mg total) by mouth 3 (three) times daily.  270 tablet  3  . SUMAtriptan (IMITREX) 100 MG tablet Take 1 tablet (100 mg total) by mouth 2 (two) times daily as needed.  45 tablet  3  . trimethoprim (TRIMPEX) 100 MG tablet Take 1 tablet (100 mg total) by mouth 2 (two) times daily.  30 tablet  3  . [DISCONTINUED]  atorvastatin (LIPITOR) 20 MG tablet Take 20 mg by mouth daily.        . [DISCONTINUED] benazepril (LOTENSIN) 20 MG tablet Take 20 mg by mouth daily.        . [DISCONTINUED] omeprazole (PRILOSEC OTC) 20 MG tablet Take 20 mg by mouth daily.        . [DISCONTINUED] valsartan-hydrochlorothiazide (DIOVAN-HCT) 160-12.5 MG per tablet Take 1 tablet by mouth daily.        . [DISCONTINUED] Venlafaxine HCl 225 MG TB24 Take by mouth daily.         No current facility-administered medications on file prior to visit.   Family History  Problem Relation Age of Onset  . Hypertension Mother   . Cancer Mother   . Hypertension Father   . Cancer Father   . Hypertension Paternal Grandmother   . Hypertension Paternal Grandfather   . Cancer Other    History   Social History  . Marital Status: Divorced    Spouse Name: N/A    Number of Children: N/A  . Years of Education: N/A   Occupational History  . Not on file.   Social History Main Topics  . Smoking status: Never Smoker   . Smokeless tobacco: Not on file  . Alcohol Use: No  . Drug Use: No  . Sexual Activity: Not on file   Other Topics  Concern  . Not on file   Social History Narrative  . No narrative on file    Review of Systems: Constitutional: Negative for fever, chills, diaphoresis, activity change, appetite change and fatigue. HENT: Negative for ear pain, nosebleeds, congestion, facial swelling, neck pain, neck stiffness and ear discharge. Positive for rhinorrhea Eyes: Negative for pain, redness, itching and visual disturbance. Positive for clear drainage. Respiratory: Negative for choking, chest tightness, and stridor. Positive for cough, SOB, and wheezing. Cardiovascular: Negative for chest pain, palpitations and leg swelling. Gastrointestinal: Negative for abdominal distention. Musculoskeletal: Negative for back pain, joint swelling, arthralgias and gait problem. Neurological: Negative for tremors, seizures, syncope, facial  asymmetry, speech difficulty, weakness, light-headedness, numbness and headaches. Positive for dizziness    Objective:   Filed Vitals:   10/17/13 1122  BP: 121/83  Pulse: 76  Temp: 98.3 F (36.8 C)  Resp: 14   Physical Exam  Vitals reviewed. Constitutional: No distress.  HENT:  Right Ear: External ear normal.  Left Ear: External ear normal.  Mouth/Throat: Oropharynx is clear and moist.  Eyes: Pupils are equal, round, and reactive to light. Right eye exhibits no discharge. Left eye exhibits no discharge.  Neck: Normal range of motion. Neck supple.  Cardiovascular: Normal rate, regular rhythm and normal heart sounds.   Pulmonary/Chest: She has wheezes.  Abdominal: Soft. Bowel sounds are normal.  Lymphadenopathy:    She has no cervical adenopathy.  Skin: Skin is warm and dry. No rash noted.  Psychiatric: She has a normal mood and affect.     Lab Results  Component Value Date   WBC 11.8* 03/06/2010   HGB 11.9* 03/06/2010   HCT 35.6* 03/06/2010   MCV 85.2 03/06/2010   PLT 233 03/06/2010   Lab Results  Component Value Date   CREATININE 0.94 04/03/2013   BUN 16 04/03/2013   NA 138 04/03/2013   K 4.1 04/03/2013   CL 107 04/03/2013   CO2 28 04/03/2013    Lab Results  Component Value Date   HGBA1C 5.7* 04/03/2013   Lipid Panel     Component Value Date/Time   CHOL 243* 04/03/2013 1215   TRIG 218* 04/03/2013 1215   HDL 48 04/03/2013 1215   CHOLHDL 5.1 04/03/2013 1215   VLDL 44* 04/03/2013 1215   LDLCALC 151* 04/03/2013 1215       Assessment and plan:   Caress was seen today for follow-up.  Diagnoses and associated orders for this visit:  Asthma with acute exacerbation - fluticasone (FLOVENT HFA) 110 MCG/ACT inhaler; Inhale 2 puffs into the lungs 2 (two) times daily at 10 AM and 5 PM. Will follow up in 2 weeks to evaluate symptoms.   HTN (hypertension) - amLODipine (NORVASC) 10 MG tablet; Take 1 tablet (10 mg total) by mouth daily. Discontinued  lisinopril-HCTZ d/t cough.  Will try amlodipine, take in the evening to reduce the side effects of orthostatic hypotension.   Allergic rhinitis - cetirizine (ZYRTEC) 10 MG tablet; Take 1 tablet (10 mg total) by mouth daily.  Return in about 2 weeks (around 10/31/2013), or if symptoms worsen or fail to improve, for asthma f/u and BP recheck.    Chari Manning, NP-C [facility] [phone] 10/19/2013, 6:35 PM

## 2013-11-21 ENCOUNTER — Other Ambulatory Visit: Payer: Self-pay

## 2013-11-21 MED ORDER — AMLODIPINE BESYLATE 5 MG PO TABS: 5.0000 mg | ORAL_TABLET | Freq: Every day | ORAL | Status: DC

## 2013-11-21 MED ORDER — TRIMETHOPRIM 100 MG PO TABS: 100.0000 mg | ORAL_TABLET | Freq: Two times a day (BID) | ORAL | Status: DC

## 2013-11-21 NOTE — Progress Notes (Unsigned)
11/21/13 Patient came into office for medication refills and to check  Her blood pressure. Blood pressure reading were low Left arm 95/58 pulse 56 Right arm 97/60 pulse 53 Spoke with Manfred Arch NP and she wanted me to drop her dose of Amlodipine to 5mg  daily and re check blood pressure in one week

## 2013-12-05 ENCOUNTER — Ambulatory Visit: Payer: Self-pay | Attending: Internal Medicine

## 2013-12-05 VITALS — BP 114/74 | HR 68 | Temp 98.0°F | Resp 16

## 2013-12-05 DIAGNOSIS — Z0000 Encounter for general adult medical examination without abnormal findings: Secondary | ICD-10-CM

## 2013-12-05 LAB — CBC WITH DIFFERENTIAL/PLATELET
BASOS ABS: 0.1 10*3/uL (ref 0.0–0.1)
Basophils Relative: 1 % (ref 0–1)
EOS PCT: 3 % (ref 0–5)
Eosinophils Absolute: 0.2 10*3/uL (ref 0.0–0.7)
HEMATOCRIT: 21.6 % — AB (ref 36.0–46.0)
Hemoglobin: 6.8 g/dL — CL (ref 12.0–15.0)
LYMPHS ABS: 2 10*3/uL (ref 0.7–4.0)
LYMPHS PCT: 34 % (ref 12–46)
MCH: 20.1 pg — ABNORMAL LOW (ref 26.0–34.0)
MCHC: 31 g/dL (ref 30.0–36.0)
MCV: 64.3 fL — ABNORMAL LOW (ref 78.0–100.0)
MONO ABS: 0.5 10*3/uL (ref 0.1–1.0)
Monocytes Relative: 8 % (ref 3–12)
NEUTROS ABS: 3.2 10*3/uL (ref 1.7–7.7)
Neutrophils Relative %: 54 % (ref 43–77)
Platelets: 248 10*3/uL (ref 150–400)
RBC: 3.39 MIL/uL — ABNORMAL LOW (ref 3.87–5.11)
RDW: 17.1 % — ABNORMAL HIGH (ref 11.5–15.5)
WBC: 6 10*3/uL (ref 4.0–10.5)

## 2013-12-05 NOTE — Progress Notes (Unsigned)
Pt is here for a BP check because in her last visit the NP Keck decreased her Amplodipine for 10mg  to 5 mg.

## 2013-12-05 NOTE — Progress Notes (Deleted)
   Subjective:    Patient ID: Katelyn [name], female    DOB: 01-10-1951, 63 y.o.   MRN: [id number]  HPI    Review of Systems     Objective:   Physical Exam        Assessment & Plan:

## 2013-12-05 NOTE — Patient Instructions (Signed)
You blood pressure is great today continue taking your medications as prescribed.

## 2013-12-06 ENCOUNTER — Inpatient Hospital Stay (HOSPITAL_COMMUNITY): Payer: Self-pay

## 2013-12-06 ENCOUNTER — Encounter (HOSPITAL_COMMUNITY): Payer: Self-pay | Admitting: Emergency Medicine

## 2013-12-06 ENCOUNTER — Inpatient Hospital Stay (HOSPITAL_COMMUNITY)
Admission: EM | Admit: 2013-12-06 | Discharge: 2013-12-08 | DRG: 812 | Disposition: A | Discharge status: Home / Self Care | Payer: Self-pay | Attending: Internal Medicine | Admitting: Internal Medicine

## 2013-12-06 DIAGNOSIS — E785 Hyperlipidemia, unspecified: Secondary | ICD-10-CM | POA: Diagnosis present

## 2013-12-06 DIAGNOSIS — I4891 Unspecified atrial fibrillation: Secondary | ICD-10-CM | POA: Diagnosis present

## 2013-12-06 DIAGNOSIS — F3289 Other specified depressive episodes: Secondary | ICD-10-CM | POA: Diagnosis present

## 2013-12-06 DIAGNOSIS — F411 Generalized anxiety disorder: Secondary | ICD-10-CM | POA: Diagnosis present

## 2013-12-06 DIAGNOSIS — F329 Major depressive disorder, single episode, unspecified: Secondary | ICD-10-CM | POA: Diagnosis present

## 2013-12-06 DIAGNOSIS — D62 Acute posthemorrhagic anemia: Principal | ICD-10-CM | POA: Diagnosis present

## 2013-12-06 DIAGNOSIS — I480 Paroxysmal atrial fibrillation: Secondary | ICD-10-CM

## 2013-12-06 DIAGNOSIS — Z8249 Family history of ischemic heart disease and other diseases of the circulatory system: Secondary | ICD-10-CM

## 2013-12-06 DIAGNOSIS — G43909 Migraine, unspecified, not intractable, without status migrainosus: Secondary | ICD-10-CM | POA: Diagnosis present

## 2013-12-06 DIAGNOSIS — F341 Dysthymic disorder: Secondary | ICD-10-CM

## 2013-12-06 DIAGNOSIS — K219 Gastro-esophageal reflux disease without esophagitis: Secondary | ICD-10-CM | POA: Diagnosis present

## 2013-12-06 DIAGNOSIS — R062 Wheezing: Secondary | ICD-10-CM

## 2013-12-06 DIAGNOSIS — D509 Iron deficiency anemia, unspecified: Secondary | ICD-10-CM

## 2013-12-06 DIAGNOSIS — I10 Essential (primary) hypertension: Secondary | ICD-10-CM | POA: Diagnosis present

## 2013-12-06 DIAGNOSIS — D649 Anemia, unspecified: Secondary | ICD-10-CM

## 2013-12-06 LAB — CBC
HCT: 23.1 % — ABNORMAL LOW (ref 36.0–46.0)
Hemoglobin: 6.9 g/dL — CL (ref 12.0–15.0)
MCH: 20.1 pg — AB (ref 26.0–34.0)
MCHC: 29.9 g/dL — ABNORMAL LOW (ref 30.0–36.0)
MCV: 67.3 fL — ABNORMAL LOW (ref 78.0–100.0)
Platelets: 227 10*3/uL (ref 150–400)
RBC: 3.43 MIL/uL — AB (ref 3.87–5.11)
RDW: 16.3 % — ABNORMAL HIGH (ref 11.5–15.5)
WBC: 6.4 10*3/uL (ref 4.0–10.5)

## 2013-12-06 LAB — BASIC METABOLIC PANEL
BUN: 11 mg/dL (ref 6–23)
CHLORIDE: 105 meq/L (ref 96–112)
CO2: 24 meq/L (ref 19–32)
Calcium: 9.3 mg/dL (ref 8.4–10.5)
Creatinine, Ser: 0.74 mg/dL (ref 0.50–1.10)
GFR calc Af Amer: >90 mL/min (ref >90)
GFR calc non Af Amer: >90 mL/min (ref >90)
GLUCOSE: 88 mg/dL (ref 70–99)
Potassium: 3.7 mEq/L (ref 3.7–5.3)
SODIUM: 141 meq/L (ref 137–147)

## 2013-12-06 LAB — POC OCCULT BLOOD, ED: FECAL OCCULT BLD: NEGATIVE

## 2013-12-06 LAB — ABO/RH: ABO/RH(D): O NEG

## 2013-12-06 LAB — PREPARE RBC (CROSSMATCH)

## 2013-12-06 NOTE — ED Notes (Signed)
Pt A+Ox4, presents from home with c/o generalized weakness for weeks, worsening, and told to come to ER for abnormal lab.  Pt reports seen yesterday with bloodwork drawn and told to day hgb = 6.8 and told to come to ER.  Pt reports recent changes with BP meds, though unsure of their names.  Pt denies HA, dizziness, syncope.  Reports generalized weakness.  Neuros grossly intact.  MAEI, ambulatory with steady gait.  Skin PWD.  Speaking full/clear sentences.  NAD.

## 2013-12-06 NOTE — H&P (Addendum)
[facility] History and Physical  Katelyn [name]:[id number] DOB: 07/13/1950 DOA: 12/06/2013  Referring physician:  E. Eulis Foster PCP:  Chari Manning, NP   Chief Complaint:  Fatigue and SOB  HPI:  The patient is a 63 y.o. year-old female with history of paroxysmal atrial fibrillation not on anticoagulation, hypertension, hyperlipidemia, anxiety and depression, migraine headaches who presents with fatigue and shortness of breath.  The patient was last at their baseline health several months ago.  She states she started noticing some mild orthostasis with lightheadedness when she was standing up after tying [name] shoes versus beginning from sitting on edge of the bed. Over the last 6 weeks, she has noticed progressive dyspnea on exertion and severe fatigue with exertion. When she is resting, she feels asymptomatic. She denies cold intolerance. She denies any blood in [name] stools or dark tarry stools, changes in stool consistency or frequency.  Denies weight loss and abdominal pain.  She denies vaginal bleeding or any other signs of bleeding.  She was seen by [name] primary care doctor he yesterday because of [name] generalized fatigue and dyspnea and routine CBC demonstrated anemia with hemoglobin of 6.8. She was referred to the emergency department for further evaluation.  In the emergency department, [name] vital signs were stable. [name] hemoglobin was 6.9.  [name] hemoglobin and MCV from 4 years ago were within normal limits, but currently she has a microcytic anemia. [name] BMP was within normal limits.  Occult stool is negative x1.  She is being admitted for syndromatic anemia.   Review of Systems:  General:  Denies fevers, chills, weight loss or gain HEENT:  Denies changes to hearing and vision, rhinorrhea, sinus congestion, sore throat CV:  Denies chest pain and palpitations, lower extremity edema.  PULM:  Denies wheezing, cough.   GI:  Denies nausea, vomiting, constipation, diarrhea.   GU:  Denies  dysuria, frequency, urgency ENDO:  Denies polyuria, polydipsia.   HEME:  Denies hematemesis, blood in stools, melena, abnormal bruising or bleeding.  LYMPH:  Denies lymphadenopathy.   MSK:  Denies arthralgias, myalgias.   DERM:  Denies skin rash or ulcer.   NEURO:  Denies focal numbness, weakness, slurred speech, confusion, facial droop.  PSYCH:  Denies anxiety and depression.    Past Medical History  Diagnosis Date  . ANXIETY DEPRESSION 04/14/2009  . Atrial fibrillation 04/14/2009  . GERD 09/07/2009  . HYPERLIPIDEMIA 09/07/2009  . HYPERTENSION 07/02/2009  . KNEE PAIN, RIGHT 12/07/2009  . MIGRAINE UNSP W/O INTRACT W/O STATUS MIGRAINOSUS 07/02/2009   Past Surgical History  Procedure Laterality Date  . Appendectomy    . Abdominal hysterectomy    . Septorhinoplasty     Social History:  reports that she has never smoked. She does not have any smokeless tobacco history on file. She reports that she does not drink alcohol or use illicit drugs. Previously worked at [employer], has been stressed by 12 years of separation with husband who recently stopped paying alimony.  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    Allergies  Allergen Reactions  . Codeine Nausea And Vomiting  . Meperidine Hcl Other (See Comments)    unknown  . Morphine Itching and Nausea And Vomiting    Family History  Problem Relation Age of Onset  . Hypertension Mother   . Cancer Mother   . Hypertension Father   . Cancer Father   . Hypertension Paternal Grandmother   . Hypertension Paternal Grandfather   . Cancer  Other      Prior to Admission medications   Medication Sig Start Date End Date Taking? Authorizing Provider  albuterol (PROVENTIL HFA;VENTOLIN HFA) 108 (90 BASE) MCG/ACT inhaler Inhale 2 puffs into the lungs every 6 (six) hours as needed for wheezing or shortness of breath. 08/12/13  Yes Angelica Chessman, MD  amLODipine (NORVASC) 5 MG tablet Take 1 tablet (5 mg total) by mouth daily. 11/21/13  Yes  Lance Bosch, NP  cetirizine (ZYRTEC) 10 MG tablet Take 1 tablet (10 mg total) by mouth daily. 10/17/13  Yes Lance Bosch, NP  FLUoxetine (PROZAC) 20 MG capsule Take 40 mg by mouth daily.   Yes Historical Provider, MD  propranolol (INDERAL) 80 MG tablet Take 1 tablet (80 mg total) by mouth 3 (three) times daily. 08/12/13  Yes Angelica Chessman, MD  sulfamethoxazole-trimethoprim (BACTRIM DS,SEPTRA DS) 800-160 MG per tablet Take 1 tablet by mouth 2 (two) times daily. 11/21/13  Yes Historical Provider, MD   Physical Exam: Filed Vitals:   12/06/13 1835 12/06/13 2205 12/06/13 2210  BP: 124/76    Pulse: 71    Temp: 98.6 F (37 C)    TempSrc: Oral    Resp: 18 16   SpO2: 97% 89% 99%     General:  Obese Caucasian female, no acute distress   Eyes:  PERRL, anicteric, non-injected.  ENT:  Nares clear.  OP clear, non-erythematous without plaques or exudates.  MMM.  Neck:  Supple without TM or JVD.    Lymph:  No cervical, supraclavicular, or submandibular LAD.  Cardiovascular:  RRR, normal S1, S2, without m/r/g.  2+ pulses, warm extremities  Respiratory:  CTA bilaterally without increased WOB.  Abdomen:  NABS.  Soft, ND/NT.    Skin:   Scarring and some purpleish 2 mm to 3 mm marks in antecubital fossa bilaterally, left greater than right   Musculoskeletal:  Normal bulk and tone.  No LE edema.  Psychiatric:  A & O x 4.  Appropriate affect.  Neurologic:  CN 3-12 intact.  5/5 strength.  Sensation intact.  Labs on Admission:  Basic Metabolic Panel:  Recent Labs Lab 12/06/13 1924  NA 141  K 3.7  CL 105  CO2 24  GLUCOSE 88  BUN 11  CREATININE 0.74  CALCIUM 9.3   Liver Function Tests: No results found for this basename: AST, ALT, ALKPHOS, BILITOT, PROT, ALBUMIN,  in the last 168 hours No results found for this basename: LIPASE, AMYLASE,  in the last 168 hours No results found for this basename: AMMONIA,  in the last 168 hours CBC:  Recent Labs Lab 12/05/13 1731  12/06/13 1924  WBC 6.0 6.4  NEUTROABS 3.2  --   HGB 6.8* 6.9*  HCT 21.6* 23.1*  MCV 64.3* 67.3*  PLT 248 227   Cardiac Enzymes: No results found for this basename: CKTOTAL, CKMB, CKMBINDEX, TROPONINI,  in the last 168 hours  BNP (last 3 results) No results found for this basename: PROBNP,  in the last 8760 hours CBG: No results found for this basename: GLUCAP,  in the last 168 hours  Radiological Exams on Admission: No results found.  EKG: None  Assessment/Plan Active Problems:   ANXIETY DEPRESSION   MIGRAINE UNSP W/O INTRACT W/O STATUS MIGRAINOSUS   HYPERTENSION   Atrial fibrillation   Essential hypertension, benign   Microcytic anemia   Symptomatic anemia  ---  Symptomatic anemia, microcytic suggesting iron deficiency even though [name] occult stool was negative.  Perhaps she has GI losses  secondary to gastritis or peptic ulcer disease from recurrent treatment with prednisone over the last 6 months. Alternatively, she could have a GI malignancy.  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  No orthopnea or lower extremity edema to suggest development of heart failure.  -  TSH, B12, folate, iron studies -  Transfuse 2 units PRBCs -  Additional occult stool if patient has spontaneous BM -  Recommend close GI follow up or inpatient evaluation if felt indicated based on response to transfusion  Acute hypoxic respiratory failure may be secondary to anemia.  No tachycardia to suggest PE. -  CXR:  neg -  Transfuse PRBC -  Wean oxygen as tolerated  Hypertension/hyperlipidemia, blood pressure stable, continue Norvasc and propranolol  Migraine headaches, none recent, continue propranolol proph.  Paroxysmal atrial fibrillation, not on anticoagulation and would not recommend starting even though [name] chads2vasc score is 2 due to concern for GIB.    GERD, not on medication, start PPI  Depression/anxiety, stable, denies SI/HI,  continue prozac  Diet:  Healthy heart Access:  PIV IVF:  Off Proph:  SCDs  Code Status: full Family Communication: patient alone Disposition Plan: Admit to MedSurg  Time spent: 60 min Ziggy Reveles, [facility] Pager [phone]  If 7PM-7AM, please contact night-coverage [email or web address] Password [email or web address] 12/06/2013, 10:40 PM

## 2013-12-06 NOTE — ED Notes (Signed)
Pt was 89% on Room Air, laying on the stretcher. The Pt's Sp02 level dropped to 86% and 84% when sitting and standing. Pt was placed on 2LPM 02 via [place] and her Sp02 level rose to 99%.

## 2013-12-06 NOTE — ED Provider Notes (Signed)
  Face-to-face evaluation   History: She reports gradual weakness over 2 weeks, worse with exertion, improves with rest. No sign of source of bleeding.  Physical exam: Alert, calm, cooperative. Sclerae nonicteric. Extremities are pale. Abdomen soft, nontender.  Medical screening examination/treatment/procedure(s) were conducted as a shared visit with non-physician practitioner(s) and myself.  I personally evaluated the patient during the encounter  Richarda Blade, MD 12/07/13 [phone]

## 2013-12-06 NOTE — ED Provider Notes (Signed)
CSN: [id number]     Arrival date & time 12/06/13  1805 History   First MD Initiated Contact with Patient 12/06/13 2050     Chief Complaint  Patient presents with  . Abnormal Lab  . Weakness     (Consider location/radiation/quality/duration/timing/severity/associated sxs/prior Treatment) HPI Pt is 63yo female advised to come to ED by PCP due to Hgb of 6.8 that was checked yesterday due to 72mo hx of gradually worsening generalized weekness and fatigue, worsening over last 2 weeks. Pt denies previous hx of anemia. Reports switching from lisinopril to amlodipine due to ACE related cough and thought her weakness was due to BP medication. Pt states she feels fine at rest but has cut back her daily activity due to fatigue. Reports becoming lightheaded and feels like she is going to pass out after walking about 60 feet. Denies chest pain, SOB, weight gain, palpitations or hx of same. Denies fever, n/v/d. Denies symptoms in ED while at rest.  Denies hematuria, hematochezia or vaginal bleeding. Pt does report hx of afib in 2010 but denies palpitation and states she has felt her pulse and it feels regular to her.   Past Medical History  Diagnosis Date  . ANXIETY DEPRESSION 04/14/2009  . Atrial fibrillation 04/14/2009  . GERD 09/07/2009  . HYPERLIPIDEMIA 09/07/2009  . HYPERTENSION 07/02/2009  . KNEE PAIN, RIGHT 12/07/2009  . MIGRAINE UNSP W/O INTRACT W/O STATUS MIGRAINOSUS 07/02/2009   Past Surgical History  Procedure Laterality Date  . Appendectomy    . Abdominal hysterectomy    . Septorhinoplasty     Family History  Problem Relation Age of Onset  . Hypertension Mother   . Cancer Mother   . Hypertension Father   . Cancer Father   . Hypertension Paternal Grandmother   . Hypertension Paternal Grandfather   . Cancer Other    History  Substance Use Topics  . Smoking status: Never Smoker   . Smokeless tobacco: Not on file  . Alcohol Use: No   OB History   Grav Para Term Preterm Abortions TAB  SAB Ect Mult Living                 Review of Systems  Constitutional: Positive for fatigue. Negative for fever, chills, diaphoresis and appetite change.  Respiratory: Negative for cough and shortness of breath.   Cardiovascular: Negative for chest pain, palpitations and leg swelling.  Gastrointestinal: Negative for nausea, vomiting, abdominal pain, diarrhea, constipation and blood in stool.  Genitourinary: Negative for dysuria, hematuria, flank pain, vaginal bleeding and menstrual problem.  Musculoskeletal: Negative for arthralgias, back pain and myalgias.  Skin: Positive for pallor. Negative for color change, rash and wound.  Neurological: Positive for weakness and light-headedness ( after exersion ). Negative for dizziness, tremors, syncope and headaches.  All other systems reviewed and are negative.     Allergies  Codeine; Meperidine hcl; and Morphine  Home Medications   Prior to Admission medications   Medication Sig Start Date End Date Taking? Authorizing Provider  albuterol (PROVENTIL HFA;VENTOLIN HFA) 108 (90 BASE) MCG/ACT inhaler Inhale 2 puffs into the lungs every 6 (six) hours as needed for wheezing or shortness of breath. 08/12/13  Yes Angelica Chessman, MD  amLODipine (NORVASC) 5 MG tablet Take 1 tablet (5 mg total) by mouth daily. 11/21/13  Yes Lance Bosch, NP  cetirizine (ZYRTEC) 10 MG tablet Take 1 tablet (10 mg total) by mouth daily. 10/17/13  Yes Lance Bosch, NP  FLUoxetine (PROZAC) 20 MG capsule  Take 40 mg by mouth daily.   Yes Historical Provider, MD  propranolol (INDERAL) 80 MG tablet Take 1 tablet (80 mg total) by mouth 3 (three) times daily. 08/12/13  Yes Angelica Chessman, MD  sulfamethoxazole-trimethoprim (BACTRIM DS,SEPTRA DS) 800-160 MG per tablet Take 1 tablet by mouth 2 (two) times daily. 11/21/13  Yes Historical Provider, MD   BP 124/76  Pulse 71  Temp(Src) 98.6 F (37 C) (Oral)  Resp 18  SpO2 97% Physical Exam  Nursing note and vitals  reviewed. Constitutional: She is oriented to person, place, and time. She appears well-developed and well-nourished. No distress.  Pt lying comfortably on exam bed, alert   HENT:  Head: Normocephalic and atraumatic.  Eyes: Conjunctivae are normal. No scleral icterus.  Neck: Normal range of motion.  Cardiovascular: Normal rate, regular rhythm and normal heart sounds.   Regular rate and rhythm, cap refill <3 seconds  Pulmonary/Chest: Effort normal and breath sounds normal. No respiratory distress. She has no wheezes. She has no rales. She exhibits no tenderness.  Abdominal: Soft. Bowel sounds are normal. She exhibits no distension and no mass. There is no tenderness. There is no rebound and no guarding.  Soft, non-distended, non-tender.  Genitourinary: Guaiac negative stool.  Musculoskeletal: Normal range of motion.  Neurological: She is alert and oriented to person, place, and time.  Skin: Skin is warm and dry. She is not diaphoretic. There is pallor.    ED Course  Procedures (including critical care time) Labs Review Labs Reviewed  CBC - Abnormal; Notable for the following:    RBC 3.43 (*)    Hemoglobin 6.9 (*)    HCT 23.1 (*)    MCV 67.3 (*)    MCH 20.1 (*)    MCHC 29.9 (*)    RDW 16.3 (*)    All other components within normal limits  BASIC METABOLIC PANEL  OCCULT BLOOD X 1 CARD TO LAB, STOOL  POC OCCULT BLOOD, ED  TYPE AND SCREEN  ABO/RH  PREPARE RBC (CROSSMATCH)    Imaging Review No results found.   EKG Interpretation None      MDM   Final diagnoses:  Symptomatic anemia    Pt is a 63yo female sent to ED by PCP due to Hgb of 6.9.  Pt reports gradually worsening weakness that has affected her daily routine.  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 Pt does report hx of afib however denies palpitation. Heart is regular rate and rhythm in ED.  Hgb 6.9 today.  MCV-67.3. Previous Hgb was 11.9 and 13.1 three and 4 years  ago.   Hemoccult negative. Pt states she would rather not have a blood transfusion but understands if it is needed she will have one as her fatigue has affected her ADLs. Discussed pt with Dr. Eulis Foster, will consult hospitalist to recommend 2L blood transfusion due to symptomatic anemia. No obvious source of bleeding.   10:03 PM Consulted with Dr. Janece Canterbury, will admit pt to med-surg bed for transfusion of 2L PRBC.  Pt is hemodynamically stable at this time.      Noland Fordyce, PA-C 12/06/13 2204

## 2013-12-06 NOTE — ED Notes (Signed)
On assessment patient axox4 with no complaints. Patient states she went to her primary MD yesterday and her hemoglobin was 6.8. Patient states today she is still feeling weak. Patient denies pain or bloody stools.

## 2013-12-07 LAB — CBC
HCT: 27.2 % — ABNORMAL LOW (ref 36.0–46.0)
Hemoglobin: 8.5 g/dL — ABNORMAL LOW (ref 12.0–15.0)
MCH: 21.9 pg — ABNORMAL LOW (ref 26.0–34.0)
MCHC: 31.3 g/dL (ref 30.0–36.0)
MCV: 69.9 fL — ABNORMAL LOW (ref 78.0–100.0)
PLATELETS: 187 10*3/uL (ref 150–400)
RBC: 3.89 MIL/uL (ref 3.87–5.11)
RDW: 17.4 % — AB (ref 11.5–15.5)
WBC: 6.4 10*3/uL (ref 4.0–10.5)

## 2013-12-07 LAB — VITAMIN B12: VITAMIN B 12: 238 pg/mL (ref 211–911)

## 2013-12-07 LAB — IRON AND TIBC
Iron: 12 ug/dL — ABNORMAL LOW (ref 42–135)
Saturation Ratios: 3 % — ABNORMAL LOW (ref 20–55)
TIBC: 439 ug/dL (ref 250–470)
UIBC: 427 ug/dL — ABNORMAL HIGH (ref 125–400)

## 2013-12-07 LAB — FOLATE RBC: RBC FOLATE: 773 ng/mL — AB (ref >280)

## 2013-12-07 LAB — TRANSFERRIN: Transferrin: 362 mg/dL — ABNORMAL HIGH (ref 200–360)

## 2013-12-07 LAB — FERRITIN

## 2013-12-07 LAB — TSH: TSH: 2.7 u[IU]/mL (ref 0.350–4.500)

## 2013-12-07 MED ORDER — BISACODYL 5 MG PO TBEC: 5.0000 mg | DELAYED_RELEASE_TABLET | Freq: Every day | ORAL | Status: DC | PRN

## 2013-12-07 MED ORDER — ONDANSETRON HCL 4 MG PO TABS: 4.0000 mg | ORAL_TABLET | Freq: Four times a day (QID) | ORAL | Status: DC | PRN

## 2013-12-07 MED ORDER — POLYETHYLENE GLYCOL 3350 17 G PO PACK
17.0000 g | PACK | Freq: Every day | ORAL | Status: DC | PRN
  Filled 2013-12-07: qty 1

## 2013-12-07 MED ORDER — ONDANSETRON HCL 4 MG/2ML IJ SOLN: 4.0000 mg | Freq: Four times a day (QID) | INTRAMUSCULAR | Status: DC | PRN

## 2013-12-07 MED ORDER — FLUOXETINE HCL 20 MG PO CAPS
40.0000 mg | ORAL_CAPSULE | Freq: Every day | ORAL | Status: DC
  Administered 2013-12-07 – 2013-12-08 (×2): 40 mg via ORAL
  Filled 2013-12-07 (×2): qty 2

## 2013-12-07 MED ORDER — PROPRANOLOL HCL 80 MG PO TABS
80.0000 mg | ORAL_TABLET | Freq: Three times a day (TID) | ORAL | Status: DC
  Administered 2013-12-07 – 2013-12-08 (×4): 80 mg via ORAL
  Filled 2013-12-07 (×6): qty 1

## 2013-12-07 MED ORDER — AMLODIPINE BESYLATE 5 MG PO TABS
5.0000 mg | ORAL_TABLET | Freq: Every day | ORAL | Status: DC
  Administered 2013-12-07 – 2013-12-08 (×2): 5 mg via ORAL
  Filled 2013-12-07 (×2): qty 1

## 2013-12-07 MED ORDER — ACETAMINOPHEN 650 MG RE SUPP: 650.0000 mg | Freq: Four times a day (QID) | RECTAL | Status: DC | PRN

## 2013-12-07 MED ORDER — LORATADINE 10 MG PO TABS
10.0000 mg | ORAL_TABLET | Freq: Every day | ORAL | Status: DC
  Administered 2013-12-07 – 2013-12-08 (×2): 10 mg via ORAL
  Filled 2013-12-07 (×2): qty 1

## 2013-12-07 MED ORDER — PANTOPRAZOLE SODIUM 40 MG PO TBEC
40.0000 mg | DELAYED_RELEASE_TABLET | Freq: Every day | ORAL | Status: DC
  Administered 2013-12-07 – 2013-12-08 (×2): 40 mg via ORAL
  Filled 2013-12-07 (×2): qty 1

## 2013-12-07 MED ORDER — ACETAMINOPHEN 325 MG PO TABS
650.0000 mg | ORAL_TABLET | Freq: Four times a day (QID) | ORAL | Status: DC | PRN
  Administered 2013-12-07: 650 mg via ORAL
  Filled 2013-12-07: qty 2

## 2013-12-07 NOTE — Progress Notes (Signed)
Given 2 units of blood. BP dropped with first 15 minutes of first unit. Patient without any other symptoms. Blood transfusion stopped and Donnal Debar, NP notified. Advised to continue transfusion and notify for any new changes. Patient tolerated transfusions well and BP remained stable.

## 2013-12-07 NOTE — Progress Notes (Signed)
Spoke with pt this AM about her poor diet which she acknowledges. States she is on food stamps and her ex husband filed bankrupcey so she is not getting alimony now. Limited income makes having a healthy diet more difficult. She was suppose to start a new job tomorrow, not sure how long she'll be in the hospital. Son in to visit. Pt weepy. Spoke with Education officer, museum about pt's situation- she gave the little green book of free meals in [place] to the pt.

## 2013-12-07 NOTE — Plan of Care (Signed)
Problem: Phase I Progression Outcomes Goal: OOB as tolerated unless otherwise ordered Outcome: Progressing Ambulates to the bathroom by self

## 2013-12-07 NOTE — Progress Notes (Signed)
Patient ID: Katelyn [name], female   DOB: Oct 30, 1950, 63 y.o.   MRN: [id number]  [facility] PROGRESS NOTE  Katelyn [name] QPR:[id number] DOB: [birth year] DOA: 12/06/2013 PCP: Chari Manning, NP  Brief narrative: 63 y.o. year-old female with history of paroxysmal atrial fibrillation not on anticoagulation, hypertension, hyperlipidemia, anxiety and depression, migraine headaches who presented to [facility] ED with main concern of progressive dyspnea worse with exertion but intermittently present at rest as well, associated malaise and poor oral intake. No similar events in the past. Denies chest pain, no abdominal pain, no urinary concerns, no blood in stool or urine noted. No vaginal bleeding. FOBT in ED negative but Hg noted to be ~6 requiring transfusion of 2 U PRBC. [facility] asked to admit for further evaluation.   Active Problems:   Acute blood loss anemia, microcytic  - unclear etiology, anemia panel pending , FOBT negative - appropriate increase in Hg post transfusion - CBC in AM   ANXIETY DEPRESSION - stable this AM   MIGRAINE UNSP W/O INTRACT W/O STATUS MIGRAINOSUS - denies headache this AM   HYPERTENSION - stable and reasonable inpatient control    Atrial fibrillation - in sinus rhythm this AM - no AC due to high risk bleed   Consultants:  None  Procedures/Studies:  Dg Chest 2 View  12/07/2013 No acute cardiopulmonary abnormality seen.     Antibiotics:  None  Code Status: Full Family Communication: Pt at bedside Disposition Plan: Home when medically stable  HPI/Subjective: No events overnight.   Objective: Filed Vitals:   12/07/13 0537 12/07/13 0645 12/07/13 1057 12/07/13 1058  BP: 111/59 110/59 110/59 110/59  Pulse: 63 62 62   Temp: 98.2 F (36.8 C) 98 F (36.7 C)    TempSrc: Oral Oral    Resp: 14 16    Height:      Weight:      SpO2:  93%      Intake/Output Summary (Last 24 hours) at 12/07/13 1247 Last data filed at 12/07/13 1138  Gross per 24 hour   Intake 1542.5 ml  Output   1150 ml  Net  392.5 ml    Exam:   General:  Pt is alert, follows commands appropriately, not in acute distress  Cardiovascular: Regular rate and rhythm, S1/S2, no murmurs, no rubs, no gallops  Respiratory: Clear to auscultation bilaterally, no wheezing, no crackles, no rhonchi  Abdomen: Soft, non tender, non distended, bowel sounds present, no guarding  Extremities: No edema, pulses DP and PT palpable bilaterally  Neuro: Grossly nonfocal  Data Reviewed: Basic Metabolic Panel:  Recent Labs Lab 12/06/13 1924  NA 141  K 3.7  CL 105  CO2 24  GLUCOSE 88  BUN 11  CREATININE 0.74  CALCIUM 9.3   CBC:  Recent Labs Lab 12/05/13 1731 12/06/13 1924 12/07/13 1030  WBC 6.0 6.4 6.4  NEUTROABS 3.2  --   --   HGB 6.8* 6.9* 8.5*  HCT 21.6* 23.1* 27.2*  MCV 64.3* 67.3* 69.9*  PLT 248 227 187    Scheduled Meds: . amLODipine  5 mg Oral Daily  . FLUoxetine  40 mg Oral Daily  . loratadine  10 mg Oral Daily  . pantoprazole  40 mg Oral Daily  . propranolol  80 mg Oral TID   Continuous Infusions:   Faye Ramsay, MD  [facility] Pager [phone]  If 7PM-7AM, please contact night-coverage [email or web address] Password [facility] 12/07/2013, 12:47 PM   LOS: 1 day

## 2013-12-08 DIAGNOSIS — F341 Dysthymic disorder: Secondary | ICD-10-CM

## 2013-12-08 LAB — TYPE AND SCREEN
ABO/RH(D): O NEG
ANTIBODY SCREEN: NEGATIVE
UNIT DIVISION: 0
UNIT DIVISION: 0

## 2013-12-08 LAB — CBC
HCT: 27.5 % — ABNORMAL LOW (ref 36.0–46.0)
Hemoglobin: 8.5 g/dL — ABNORMAL LOW (ref 12.0–15.0)
MCH: 21.6 pg — ABNORMAL LOW (ref 26.0–34.0)
MCHC: 30.9 g/dL (ref 30.0–36.0)
MCV: 70 fL — ABNORMAL LOW (ref 78.0–100.0)
Platelets: 179 10*3/uL (ref 150–400)
RBC: 3.93 MIL/uL (ref 3.87–5.11)
RDW: 17.3 % — ABNORMAL HIGH (ref 11.5–15.5)
WBC: 8.1 10*3/uL (ref 4.0–10.5)

## 2013-12-08 LAB — BASIC METABOLIC PANEL
BUN: 8 mg/dL (ref 6–23)
CALCIUM: 8.3 mg/dL — AB (ref 8.4–10.5)
CO2: 22 meq/L (ref 19–32)
Chloride: 105 mEq/L (ref 96–112)
Creatinine, Ser: 0.71 mg/dL (ref 0.50–1.10)
GFR calc Af Amer: >90 mL/min (ref >90)
GFR calc non Af Amer: >90 mL/min (ref >90)
Glucose, Bld: 102 mg/dL — ABNORMAL HIGH (ref 70–99)
POTASSIUM: 3.2 meq/L — AB (ref 3.7–5.3)
SODIUM: 140 meq/L (ref 137–147)

## 2013-12-08 MED ORDER — POTASSIUM CHLORIDE CRYS ER 20 MEQ PO TBCR
40.0000 meq | EXTENDED_RELEASE_TABLET | Freq: Once | ORAL | Status: AC
  Administered 2013-12-08: 40 meq via ORAL
  Filled 2013-12-08 (×2): qty 2

## 2013-12-08 MED ORDER — IRON 325 (65 FE) MG PO TABS: 325.0000 mg | ORAL_TABLET | Freq: Two times a day (BID) | ORAL | Status: DC

## 2013-12-08 NOTE — Discharge Instructions (Signed)
Iron Deficiency Anemia Anemia is a condition in which there are less red blood cells or hemoglobin in the blood than normal. Hemoglobin is the part of red blood cells that carries oxygen. Iron deficiency anemia is anemia caused by too little iron. It is the most common type of anemia. It may leave you tired and short of breath. CAUSES   Lack of iron in the diet.  Poor absorption of iron, as seen with intestinal disorders.  Intestinal bleeding.  Heavy periods. SIGNS AND SYMPTOMS  Mild anemia may not be noticeable. Symptoms may include:  Fatigue.  Headache.  Pale skin.  Weakness.  Tiredness.  Shortness of breath.  Dizziness.  Cold hands and feet.  Fast or irregular heartbeat. DIAGNOSIS  Diagnosis requires a thorough evaluation and physical exam by your health care provider. Blood tests are generally used to confirm iron deficiency anemia. Additional tests may be done to find the underlying cause of your anemia. These may include:  Testing for blood in the stool (fecal occult blood test).  A procedure to see inside the colon and rectum (colonoscopy).  A procedure to see inside the esophagus and stomach (endoscopy). TREATMENT  Iron deficiency anemia is treated by correcting the cause of the deficiency. Treatment may involve:  Adding iron-rich foods to your diet.  Taking iron supplements. Pregnant or breastfeeding women need to take extra iron because their normal diet usually does not provide the required amount.  Taking vitamins. Vitamin C improves the absorption of iron. Your health care provider may recommend that you take your iron tablets with a glass of orange juice or vitamin C supplement.  Medicines to make heavy menstrual flow lighter.  Surgery. HOME CARE INSTRUCTIONS   Take iron as directed by your health care provider.  If you cannot tolerate taking iron supplements by mouth, talk to your health care provider about taking them through a vein  (intravenously) or an injection into a muscle.  For the best iron absorption, iron supplements should be taken on an empty stomach. If you cannot tolerate them on an empty stomach, you may need to take them with food.  Do not drink milk or take antacids at the same time as your iron supplements. Milk and antacids may interfere with the absorption of iron.  Iron supplements can cause constipation. Make sure to include fiber in your diet to prevent constipation. A stool softener may also be recommended.  Take vitamins as directed by your health care provider.  Eat a diet rich in iron. Foods high in iron include liver, lean beef, whole-grain bread, eggs, dried fruit, and dark green leafy vegetables. SEEK IMMEDIATE MEDICAL CARE IF:   You faint. If this happens, do not drive. Call your local emergency services (911 in [place] if no other help is available.  You have chest pain.  You feel nauseous or vomit.  You have severe or increased shortness of breath with activity.  You feel weak.  You have a rapid heartbeat.  You have unexplained sweating.  You become light-headed when getting up from a chair or bed. MAKE SURE YOU:   Understand these instructions.  Will watch your condition.  Will get help right away if you are not doing well or get worse. Document Released: 05/26/2000 Document Revised: 06/03/2013 Document Reviewed: 02/03/2013 ExitCare Patient Information 2015 [employer]. This information is not intended to replace advice given to you by your health care provider. Make sure you discuss any questions you have with your health care provider.  

## 2013-12-08 NOTE — Discharge Summary (Signed)
Physician Discharge Summary  Katelyn [name] KGY:[id number] DOB: 1950-12-28 DOA: 12/06/2013  PCP: Chari Manning, NP  Admit date: 12/06/2013 Discharge date: 12/08/2013  Recommendations for Outpatient Follow-up:  1. Pt will need to follow up with PCP in 2-3 weeks post discharge 2. Please obtain BMP to evaluate electrolytes and kidney function 3. Please also check CBC to evaluate Hg and Hct levels  Discharge Diagnoses: acute blood loss anemia, iron deficiency  Active Problems:   ANXIETY DEPRESSION   MIGRAINE UNSP W/O INTRACT W/O STATUS MIGRAINOSUS   HYPERTENSION   Atrial fibrillation   Essential hypertension, benign   Microcytic anemia   Symptomatic anemia  Discharge Condition: Stable  Diet recommendation: Heart healthy diet discussed in details   Brief narrative:  63 y.o. year-old female with history of paroxysmal atrial fibrillation not on anticoagulation, hypertension, hyperlipidemia, anxiety and depression, migraine headaches who presented to [facility] ED with main concern of progressive dyspnea worse with exertion but intermittently present at rest as well, associated malaise and poor oral intake. No similar events in the past. Denies chest pain, no abdominal pain, no urinary concerns, no blood in stool or urine noted. No vaginal bleeding. FOBT in ED negative but Hg noted to be ~6 requiring transfusion of 2 U PRBC. [facility] asked to admit for further evaluation.   Active Problems:  Acute blood loss anemia, microcytic  - iron deficiency  - appropriate increase in Hg post transfusion  - pt wants to go home  - continue iron supplementation upon discharge  - dietary recommendations provided  ANXIETY DEPRESSION  - stable this AM  MIGRAINE UNSP W/O INTRACT W/O STATUS MIGRAINOSUS  - denies headache this AM  HYPERTENSION  - stable and reasonable inpatient control  Atrial fibrillation  - in sinus rhythm this AM  - no AC due to high risk bleed   Consultants:  None Procedures/Studies:  Dg  Chest 2 View 12/07/2013 No acute cardiopulmonary abnormality seen.  Antibiotics:  None  Code Status: Full  Family Communication: Pt at bedside   Discharge Exam: Filed Vitals:   12/08/13 0958  BP: 118/72  Pulse: 74  Temp:   Resp:    Filed Vitals:   12/07/13 1606 12/07/13 2018 12/08/13 0611 12/08/13 0958  BP: 120/70 115/54 114/61 118/72  Pulse: 66 67 70 74  Temp:  98.1 F (36.7 C) 98.4 F (36.9 C)   TempSrc:  Oral Oral   Resp:  16 16   Height:      Weight:      SpO2:   92%     General: Pt is alert, follows commands appropriately, not in acute distress Cardiovascular: Regular rate and rhythm, S1/S2 +, no murmurs, no rubs, no gallops Respiratory: Clear to auscultation bilaterally, no wheezing, no crackles, no rhonchi Abdominal: Soft, non tender, non distended, bowel sounds +, no guarding Extremities: no edema, no cyanosis, pulses palpable bilaterally DP and PT Neuro: Grossly nonfocal  Discharge Instructions  Discharge Instructions   Diet - low sodium heart healthy    Complete by:  As directed      Increase activity slowly    Complete by:  As directed             Medication List         albuterol 108 (90 BASE) MCG/ACT inhaler  Commonly known as:  PROVENTIL HFA;VENTOLIN HFA  Inhale 2 puffs into the lungs every 6 (six) hours as needed for wheezing or shortness of breath.     amLODipine 5 MG tablet  Commonly known as:  NORVASC  Take 1 tablet (5 mg total) by mouth daily.     cetirizine 10 MG tablet  Commonly known as:  ZYRTEC  Take 1 tablet (10 mg total) by mouth daily.     FLUoxetine 20 MG capsule  Commonly known as:  PROZAC  Take 40 mg by mouth daily.     Iron 325 (65 FE) MG Tabs  Take 325 mg by mouth 2 (two) times daily.     propranolol 80 MG tablet  Commonly known as:  INDERAL  Take 1 tablet (80 mg total) by mouth 3 (three) times daily.     sulfamethoxazole-trimethoprim 800-160 MG per tablet  Commonly known as:  BACTRIM DS,SEPTRA DS  Take 1 tablet  by mouth 2 (two) times daily.           Follow-up Information   Schedule an appointment as soon as possible for a visit with Chari Manning, NP.   Specialty:  Internal Medicine   Contact information:   [place] [id number] [phone]       Follow up with Faye Ramsay, MD. (If symptoms worsen, As needed, call my cell phone [phone])    Specialty:  Internal Medicine   Contact information:   [address] [place] [address] [phone]       Follow up with [facility].   Specialty:  Gastroenterology   Contact information:   [place]  [id number] [phone]       The results of significant diagnostics from this hospitalization (including imaging, microbiology, ancillary and laboratory) are listed below for reference.     Microbiology: No results found for this or any previous visit (from the past 240 hour(s)).   Labs: Basic Metabolic Panel:  Recent Labs Lab 12/06/13 1924 12/08/13 0502  NA 141 140  K 3.7 3.2*  CL 105 105  CO2 24 22  GLUCOSE 88 102*  BUN 11 8  CREATININE 0.74 0.71  CALCIUM 9.3 8.3*   Liver Function Tests: No results found for this basename: AST, ALT, ALKPHOS, BILITOT, PROT, ALBUMIN,  in the last 168 hours No results found for this basename: LIPASE, AMYLASE,  in the last 168 hours No results found for this basename: AMMONIA,  in the last 168 hours CBC:  Recent Labs Lab 12/05/13 1731 12/06/13 1924 12/07/13 1030 12/08/13 0502  WBC 6.0 6.4 6.4 8.1  NEUTROABS 3.2  --   --   --   HGB 6.8* 6.9* 8.5* 8.5*  HCT 21.6* 23.1* 27.2* 27.5*  MCV 64.3* 67.3* 69.9* 70.0*  PLT 248 227 187 179   Cardiac Enzymes: No results found for this basename: CKTOTAL, CKMB, CKMBINDEX, TROPONINI,  in the last 168 hours BNP: BNP (last 3 results) No results found for this basename: PROBNP,  in the last 8760 hours CBG: No results found for this basename: GLUCAP,  in the last  168 hours   SIGNED: Time coordinating discharge: Over 30 minutes  Faye Ramsay, MD  [facility] 12/08/2013, 1:33 PM Pager [phone]  If 7PM-7AM, please contact night-coverage [email or web address] Password [email or web address]

## 2013-12-08 NOTE — Progress Notes (Signed)
Discharge instructions explained to pt, and script given for FE. Pt states understanding instructions. She states the MD ok'd her to start work 6/30. Son in to pick pt. Up and bring her home. She states she feels much better.

## 2014-01-14 ENCOUNTER — Encounter: Payer: Self-pay | Admitting: Gastroenterology

## 2014-03-18 ENCOUNTER — Encounter: Payer: Self-pay | Admitting: Gastroenterology

## 2014-03-18 ENCOUNTER — Ambulatory Visit (INDEPENDENT_AMBULATORY_CARE_PROVIDER_SITE_OTHER): Payer: BC Managed Care – PPO | Admitting: Gastroenterology

## 2014-03-18 ENCOUNTER — Other Ambulatory Visit (INDEPENDENT_AMBULATORY_CARE_PROVIDER_SITE_OTHER): Payer: BC Managed Care – PPO

## 2014-03-18 VITALS — BP 106/70 | HR 68 | Ht 65.5 in | Wt 208.4 lb

## 2014-03-18 DIAGNOSIS — D509 Iron deficiency anemia, unspecified: Secondary | ICD-10-CM

## 2014-03-18 LAB — BASIC METABOLIC PANEL
BUN: 13 mg/dL (ref 6–23)
CO2: 23 meq/L (ref 19–32)
CREATININE: 1 mg/dL (ref 0.4–1.2)
Calcium: 9.6 mg/dL (ref 8.4–10.5)
Chloride: 104 mEq/L (ref 96–112)
GFR: 63.56 mL/min (ref >60.00)
GLUCOSE: 107 mg/dL — AB (ref 70–99)
Potassium: 4.4 mEq/L (ref 3.5–5.1)
SODIUM: 139 meq/L (ref 135–145)

## 2014-03-18 LAB — CBC WITH DIFFERENTIAL/PLATELET
BASOS ABS: 0 10*3/uL (ref 0.0–0.1)
Basophils Relative: 0.6 % (ref 0.0–3.0)
EOS ABS: 0.1 10*3/uL (ref 0.0–0.7)
Eosinophils Relative: 2.3 % (ref 0.0–5.0)
HCT: 42.5 % (ref 36.0–46.0)
Hemoglobin: 14.1 g/dL (ref 12.0–15.0)
LYMPHS ABS: 2.1 10*3/uL (ref 0.7–4.0)
LYMPHS PCT: 32.9 % (ref 12.0–46.0)
MCHC: 33.1 g/dL (ref 30.0–36.0)
MCV: 86.4 fl (ref 78.0–100.0)
Monocytes Absolute: 0.5 10*3/uL (ref 0.1–1.0)
Monocytes Relative: 8.3 % (ref 3.0–12.0)
Neutro Abs: 3.5 10*3/uL (ref 1.4–7.7)
Neutrophils Relative %: 55.9 % (ref 43.0–77.0)
Platelets: 249 10*3/uL (ref 150.0–400.0)
RBC: 4.92 Mil/uL (ref 3.87–5.11)
RDW: 13.5 % (ref 11.5–15.5)
WBC: 6.3 10*3/uL (ref 4.0–10.5)

## 2014-03-18 LAB — FERRITIN: Ferritin: 25.1 ng/mL (ref 10.0–291.0)

## 2014-03-18 LAB — IRON: Iron: 55 ug/dL (ref 42–145)

## 2014-03-18 MED ORDER — MOVIPREP 100 G PO SOLR: 1.0000 | Freq: Once | ORAL | Status: DC

## 2014-03-18 NOTE — Patient Instructions (Addendum)
You will have labs checked today in the basement lab.  Please head down after you check out with the front desk  (cbc, bmet, ferritin, iron, Celiac sprue panel). You will be set up for a colonoscopy for severe iron def anemia ([facility] or hospital in next 1-2 weeks at latest, moderate sedation OK if needed). You will be set up for an upper endoscopy for severe iron anemia.

## 2014-03-18 NOTE — Progress Notes (Signed)
HPI: This is a    very pleasant 63 year old woman whom I am meeting for the first time today.  Labs 11/2013: HB 6.8, MCV 60s, Iron Low, Ferritin very low. FOBT negative. BUN/Cr were normal.  Admitted with extreme fatigue, SOB. Found to have very low Hb.  Has been on 1-2 times daily iron OTC since then  Had colonoscopy in [place], 2004-5 for routine screening and it was normal.  No colon cancer in family.  No abd pains.    She has noticed intermittent diarrhea, urgency.  No overt bleeding. Never black colored stools.  The diarrhea, urgency started after her blood counts were found to be so low.  Daily loose stools, really alternating.  appy remotely.    Overall her weight is stable to up a bit.  She had stopped eating meat for a long time, but was back on meat for 8 months prior to her anemia admission.  Had been on ASA 1-2 per day.  No vaginal bleeding.   Review of systems: Pertinent positive and negative review of systems were noted in the above HPI section. Complete review of systems was performed and was otherwise normal.    Past Medical History  Diagnosis Date  . ANXIETY DEPRESSION 04/14/2009  . Atrial fibrillation 04/14/2009  . GERD 09/07/2009  . HYPERLIPIDEMIA 09/07/2009  . HYPERTENSION 07/02/2009  . KNEE PAIN, RIGHT 12/07/2009  . MIGRAINE UNSP W/O INTRACT W/O STATUS MIGRAINOSUS 07/02/2009  . Anemia   . Recurrent UTI     Past Surgical History  Procedure Laterality Date  . Appendectomy  1964  . Abdominal hysterectomy  1997  . Septorhinoplasty  1979    Current Outpatient Prescriptions  Medication Sig Dispense Refill  . amLODipine (NORVASC) 5 MG tablet Take 1 tablet (5 mg total) by mouth daily.  30 tablet  3  . cetirizine (ZYRTEC) 10 MG tablet Take 1 tablet (10 mg total) by mouth daily.  30 tablet  5  . Ferrous Sulfate (IRON) 325 (65 FE) MG TABS Take 325 mg by mouth 2 (two) times daily.  60 each  3  . FLUoxetine (PROZAC) 20 MG capsule Take 40 mg by mouth daily.       . propranolol (INDERAL) 80 MG tablet Take 1 tablet (80 mg total) by mouth 3 (three) times daily.  270 tablet  3  . sulfamethoxazole-trimethoprim (BACTRIM DS,SEPTRA DS) 800-160 MG per tablet Take 1 tablet by mouth 2 (two) times daily.      . [DISCONTINUED] atorvastatin (LIPITOR) 20 MG tablet Take 20 mg by mouth daily.        . [DISCONTINUED] benazepril (LOTENSIN) 20 MG tablet Take 20 mg by mouth daily.        . [DISCONTINUED] omeprazole (PRILOSEC OTC) 20 MG tablet Take 20 mg by mouth daily.        . [DISCONTINUED] valsartan-hydrochlorothiazide (DIOVAN-HCT) 160-12.5 MG per tablet Take 1 tablet by mouth daily.        . [DISCONTINUED] Venlafaxine HCl 225 MG TB24 Take by mouth daily.         No current facility-administered medications for this visit.    Allergies as of 03/18/2014 - Review Complete 03/18/2014  Allergen Reaction Noted  . Codeine Nausea And Vomiting   . Meperidine hcl Other (See Comments)   . Morphine Itching and Nausea And Vomiting     Family History  Problem Relation Age of Onset  . Hypertension Mother   . Breast cancer Mother   . Hypertension Father   .  Lung cancer Father     textiles and smoker  . Hypertension Paternal Grandmother   . Hypertension Paternal Grandfather     History   Social History  . Marital Status: Divorced    Spouse Name: N/A    Number of Children: 1  . Years of Education: N/A   Occupational History  . RN    Social History Main Topics  . Smoking status: Never Smoker   . Smokeless tobacco: Never Used  . Alcohol Use: Yes     Comment: social occasional  . Drug Use: No  . Sexual Activity: Not on file   Other Topics Concern  . Not on file   Social History Narrative   Previously worked at [employer], has been stressed by 12 years of separation with husband who recently stopped paying alimony.  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         Physical Exam: Ht 5' 5.5" (1.664 m)  Wt 208 lb 6 oz (94.518 kg)  BMI 34.14  kg/m2 Constitutional: generally well-appearing Psychiatric: alert and oriented x3 Eyes: extraocular movements intact Mouth: oral pharynx moist, no lesions Neck: supple no lymphadenopathy Cardiovascular: heart regular rate and rhythm Lungs: clear to auscultation bilaterally Abdomen: soft, nontender, nondistended, no obvious ascites, no peritoneal signs, normal bowel sounds Extremities: no lower extremity edema bilaterally Skin: no lesions on visible extremities    Assessment and plan: 63 y.o. female with  severe iron deficiency anemia  After her iron deficiency anemia was diagnosed she was put on iron supplements. She has noticed some bowel changes since then but really not preceding her diagnosis. 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 I would like to proceed with colonoscopy and upper endoscopy in the next one to 2 weeks at the latest. She'll have a repeat set of labs including CBC, basic metabolic profile and celiac panel.

## 2014-03-19 LAB — CELIAC PANEL 10
ENDOMYSIAL SCREEN: NEGATIVE
GLIADIN IGA: 12.8 U/mL (ref <20)
Gliadin IgG: 16 U/mL (ref <20)
IgA: 248 mg/dL (ref 69–380)
TISSUE TRANSGLUT AB: 6.4 U/mL (ref <20)
Tissue Transglutaminase Ab, IgA: 9 U/mL (ref <20)

## 2014-03-26 ENCOUNTER — Ambulatory Visit (HOSPITAL_COMMUNITY)
Admission: RE | Admit: 2014-03-26 | Discharge: 2014-03-26 | Disposition: A | Discharge status: Home / Self Care | Payer: BC Managed Care – PPO | Source: Ambulatory Visit | Attending: Gastroenterology | Admitting: Gastroenterology

## 2014-03-26 ENCOUNTER — Encounter (HOSPITAL_COMMUNITY)
Admission: RE | Disposition: A | Discharge status: Home / Self Care | Payer: Self-pay | Source: Ambulatory Visit | Attending: Gastroenterology

## 2014-03-26 ENCOUNTER — Encounter (HOSPITAL_COMMUNITY): Payer: Self-pay

## 2014-03-26 DIAGNOSIS — D649 Anemia, unspecified: Secondary | ICD-10-CM | POA: Insufficient documentation

## 2014-03-26 DIAGNOSIS — K259 Gastric ulcer, unspecified as acute or chronic, without hemorrhage or perforation: Secondary | ICD-10-CM | POA: Insufficient documentation

## 2014-03-26 DIAGNOSIS — K5790 Diverticulosis of intestine, part unspecified, without perforation or abscess without bleeding: Secondary | ICD-10-CM | POA: Insufficient documentation

## 2014-03-26 DIAGNOSIS — Z885 Allergy status to narcotic agent status: Secondary | ICD-10-CM | POA: Insufficient documentation

## 2014-03-26 DIAGNOSIS — K449 Diaphragmatic hernia without obstruction or gangrene: Secondary | ICD-10-CM | POA: Diagnosis not present

## 2014-03-26 DIAGNOSIS — I4891 Unspecified atrial fibrillation: Secondary | ICD-10-CM | POA: Diagnosis not present

## 2014-03-26 DIAGNOSIS — Z1211 Encounter for screening for malignant neoplasm of colon: Secondary | ICD-10-CM | POA: Insufficient documentation

## 2014-03-26 DIAGNOSIS — E785 Hyperlipidemia, unspecified: Secondary | ICD-10-CM | POA: Diagnosis not present

## 2014-03-26 DIAGNOSIS — F418 Other specified anxiety disorders: Secondary | ICD-10-CM | POA: Insufficient documentation

## 2014-03-26 DIAGNOSIS — I10 Essential (primary) hypertension: Secondary | ICD-10-CM | POA: Diagnosis not present

## 2014-03-26 DIAGNOSIS — Z79899 Other long term (current) drug therapy: Secondary | ICD-10-CM | POA: Diagnosis not present

## 2014-03-26 DIAGNOSIS — K219 Gastro-esophageal reflux disease without esophagitis: Secondary | ICD-10-CM | POA: Insufficient documentation

## 2014-03-26 DIAGNOSIS — D509 Iron deficiency anemia, unspecified: Secondary | ICD-10-CM | POA: Diagnosis not present

## 2014-03-26 DIAGNOSIS — F329 Major depressive disorder, single episode, unspecified: Secondary | ICD-10-CM | POA: Diagnosis not present

## 2014-03-26 DIAGNOSIS — K5730 Diverticulosis of large intestine without perforation or abscess without bleeding: Secondary | ICD-10-CM

## 2014-03-26 HISTORY — PX: ESOPHAGOGASTRODUODENOSCOPY: SHX5428

## 2014-03-26 HISTORY — PX: COLONOSCOPY: SHX5424

## 2014-03-26 SURGERY — COLONOSCOPY
Anesthesia: Moderate Sedation

## 2014-03-26 MED ORDER — FENTANYL CITRATE 0.05 MG/ML IJ SOLN
INTRAMUSCULAR | Status: DC | PRN
  Administered 2014-03-26 (×4): 25 ug via INTRAVENOUS

## 2014-03-26 MED ORDER — FENTANYL CITRATE 0.05 MG/ML IJ SOLN
INTRAMUSCULAR | Status: AC
  Filled 2014-03-26: qty 4

## 2014-03-26 MED ORDER — SODIUM CHLORIDE 0.9 % IV SOLN
INTRAVENOUS | Status: DC
  Administered 2014-03-26: 500 mL via INTRAVENOUS

## 2014-03-26 MED ORDER — MIDAZOLAM HCL 10 MG/2ML IJ SOLN
INTRAMUSCULAR | Status: DC | PRN
  Administered 2014-03-26 (×4): 2.5 mg via INTRAVENOUS

## 2014-03-26 MED ORDER — BUTAMBEN-TETRACAINE-BENZOCAINE 2-2-14 % EX AERO
INHALATION_SPRAY | CUTANEOUS | Status: DC | PRN
Start: 2014-03-26 — End: 2014-03-26
  Administered 2014-03-26: 1 via TOPICAL

## 2014-03-26 MED ORDER — MIDAZOLAM HCL 10 MG/2ML IJ SOLN
INTRAMUSCULAR | Status: AC
  Filled 2014-03-26: qty 4

## 2014-03-26 MED ORDER — DIPHENHYDRAMINE HCL 50 MG/ML IJ SOLN
INTRAMUSCULAR | Status: AC
  Filled 2014-03-26: qty 1

## 2014-03-26 NOTE — Interval H&P Note (Signed)
History and Physical Interval Note:  03/26/2014 12:30 PM  Katelyn [name]  has presented today for surgery, with the diagnosis of Anemia, unspecified anemia type [D64.9]  The various methods of treatment have been discussed with the patient and family. After consideration of risks, benefits and other options for treatment, the patient has consented to  Procedure(s): COLONOSCOPY (N/A) ESOPHAGOGASTRODUODENOSCOPY (EGD) (N/A) as a surgical intervention .  The patient's history has been reviewed, patient examined, no change in status, stable for surgery.  I have reviewed the patient's chart and labs.  Questions were answered to the patient's satisfaction.     Milus Banister

## 2014-03-26 NOTE — H&P (View-Only) (Signed)
HPI: This is a    very pleasant 63 year old woman whom I am meeting for the first time today.  Labs 11/2013: HB 6.8, MCV 60s, Iron Low, Ferritin very low. FOBT negative. BUN/Cr were normal.  Admitted with extreme fatigue, SOB. Found to have very low Hb.  Has been on 1-2 times daily iron OTC since then  Had colonoscopy in [place], 2004-5 for routine screening and it was normal.  No colon cancer in family.  No abd pains.    She has noticed intermittent diarrhea, urgency.  No overt bleeding. Never black colored stools.  The diarrhea, urgency started after her blood counts were found to be so low.  Daily loose stools, really alternating.  appy remotely.    Overall her weight is stable to up a bit.  She had stopped eating meat for a long time, but was back on meat for 8 months prior to her anemia admission.  Had been on ASA 1-2 per day.  No vaginal bleeding.   Review of systems: Pertinent positive and negative review of systems were noted in the above HPI section. Complete review of systems was performed and was otherwise normal.    Past Medical History  Diagnosis Date  . ANXIETY DEPRESSION 04/14/2009  . Atrial fibrillation 04/14/2009  . GERD 09/07/2009  . HYPERLIPIDEMIA 09/07/2009  . HYPERTENSION 07/02/2009  . KNEE PAIN, RIGHT 12/07/2009  . MIGRAINE UNSP W/O INTRACT W/O STATUS MIGRAINOSUS 07/02/2009  . Anemia   . Recurrent UTI     Past Surgical History  Procedure Laterality Date  . Appendectomy  1964  . Abdominal hysterectomy  1997  . Septorhinoplasty  1979    Current Outpatient Prescriptions  Medication Sig Dispense Refill  . amLODipine (NORVASC) 5 MG tablet Take 1 tablet (5 mg total) by mouth daily.  30 tablet  3  . cetirizine (ZYRTEC) 10 MG tablet Take 1 tablet (10 mg total) by mouth daily.  30 tablet  5  . Ferrous Sulfate (IRON) 325 (65 FE) MG TABS Take 325 mg by mouth 2 (two) times daily.  60 each  3  . FLUoxetine (PROZAC) 20 MG capsule Take 40 mg by mouth daily.       . propranolol (INDERAL) 80 MG tablet Take 1 tablet (80 mg total) by mouth 3 (three) times daily.  270 tablet  3  . sulfamethoxazole-trimethoprim (BACTRIM DS,SEPTRA DS) 800-160 MG per tablet Take 1 tablet by mouth 2 (two) times daily.      . [DISCONTINUED] atorvastatin (LIPITOR) 20 MG tablet Take 20 mg by mouth daily.        . [DISCONTINUED] benazepril (LOTENSIN) 20 MG tablet Take 20 mg by mouth daily.        . [DISCONTINUED] omeprazole (PRILOSEC OTC) 20 MG tablet Take 20 mg by mouth daily.        . [DISCONTINUED] valsartan-hydrochlorothiazide (DIOVAN-HCT) 160-12.5 MG per tablet Take 1 tablet by mouth daily.        . [DISCONTINUED] Venlafaxine HCl 225 MG TB24 Take by mouth daily.         No current facility-administered medications for this visit.    Allergies as of 03/18/2014 - Review Complete 03/18/2014  Allergen Reaction Noted  . Codeine Nausea And Vomiting   . Meperidine hcl Other (See Comments)   . Morphine Itching and Nausea And Vomiting     Family History  Problem Relation Age of Onset  . Hypertension Mother   . Breast cancer Mother   . Hypertension Father   .  Lung cancer Father     textiles and smoker  . Hypertension Paternal Grandmother   . Hypertension Paternal Grandfather     History   Social History  . Marital Status: Divorced    Spouse Name: N/A    Number of Children: 1  . Years of Education: N/A   Occupational History  . RN    Social History Main Topics  . Smoking status: Never Smoker   . Smokeless tobacco: Never Used  . Alcohol Use: Yes     Comment: social occasional  . Drug Use: No  . Sexual Activity: Not on file   Other Topics Concern  . Not on file   Social History Narrative   Previously worked at [employer], has been stressed by 12 years of separation with husband who recently stopped paying alimony.  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         Physical Exam: Ht 5' 5.5" (1.664 m)  Wt 208 lb 6 oz (94.518 kg)  BMI 34.14  kg/m2 Constitutional: generally well-appearing Psychiatric: alert and oriented x3 Eyes: extraocular movements intact Mouth: oral pharynx moist, no lesions Neck: supple no lymphadenopathy Cardiovascular: heart regular rate and rhythm Lungs: clear to auscultation bilaterally Abdomen: soft, nontender, nondistended, no obvious ascites, no peritoneal signs, normal bowel sounds Extremities: no lower extremity edema bilaterally Skin: no lesions on visible extremities    Assessment and plan: 63 y.o. female with  severe iron deficiency anemia  After her iron deficiency anemia was diagnosed she was put on iron supplements. She has noticed some bowel changes since then but really not preceding her diagnosis. 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 I would like to proceed with colonoscopy and upper endoscopy in the next one to 2 weeks at the latest. She'll have a repeat set of labs including CBC, basic metabolic profile and celiac panel.

## 2014-03-26 NOTE — Discharge Instructions (Signed)
YOU HAD AN ENDOSCOPIC PROCEDURE TODAY: Refer to the procedure report that was given to you for any specific questions about what was found during the examination.  If the procedure report does not answer your questions, please call your gastroenterologist to clarify. ° °YOU SHOULD EXPECT: Some feelings of bloating in the abdomen. Passage of more gas than usual.  Walking can help get rid of the air that was put into your GI tract during the procedure and reduce the bloating. If you had a lower endoscopy (such as a colonoscopy or flexible sigmoidoscopy) you may notice spotting of blood in your stool or on the toilet paper.  ° °DIET: Your first meal following the procedure should be a light meal and then it is ok to progress to your normal diet.  A half-sandwich or bowl of soup is an example of a good first meal.  Heavy or fried foods are harder to digest and may make you feel nasueas or bloated.  Drink plenty of fluids but you should avoid alcoholic beverages for 24 hours. ° °ACTIVITY: Your care partner should take you home directly after the procedure.  You should plan to take it easy, moving slowly for the rest of the day.  You can resume normal activity the day after the procedure however you should NOT DRIVE or use heavy machinery for 24 hours (because of the sedation medicines used during the test).   ° °SYMPTOMS TO REPORT IMMEDIATELY  °A gastroenterologist can be reached at any hour.  Please call your doctor's office for any of the following symptoms: ° °· Following lower endoscopy (colonoscopy, flexible sigmoidoscopy) ° Excessive amounts of blood in the stool ° Significant tenderness, worsening of abdominal pains ° Swelling of the abdomen that is new, acute ° Fever of 100° or higher °· Following upper endoscopy (EGD, EUS, ERCP) ° Vomiting of blood or coffee ground material ° New, significant abdominal pain ° New, significant chest pain or pain under the shoulder blades ° Painful or persistently difficult  swallowing ° New shortness of breath ° Black, tarry-looking stools ° °FOLLOW UP: °If any biopsies were taken you will be contacted by phone or by letter within the next 1-3 weeks.  Call your gastroenterologist if you have not heard about the biopsies in 3 weeks.  °Please also call your gastroenterologist's office with any specific questions about appointments or follow up tests. ° °Conscious Sedation, Adult, Care After °Refer to this sheet in the next few weeks. These instructions provide you with information on caring for yourself after your procedure. Your health care provider may also give you more specific instructions. Your treatment has been planned according to current medical practices, but problems sometimes occur. Call your health care provider if you have any problems or questions after your procedure. °WHAT TO EXPECT AFTER THE PROCEDURE  °After your procedure: °· You may feel sleepy, clumsy, and have poor balance for several hours. °· Vomiting may occur if you eat too soon after the procedure. °HOME CARE INSTRUCTIONS °· Do not participate in any activities where you could become injured for at least 24 hours. Do not: °¨ Drive. °¨ Swim. °¨ Ride a bicycle. °¨ Operate heavy machinery. °¨ Cook. °¨ Use power tools. °¨ Climb ladders. °¨ Work from a high place. °· Do not make important decisions or sign legal documents until you are improved. °· If you vomit, drink water, juice, or soup when you can drink without vomiting. Make sure you have little or no nausea before eating   solid foods. °· Only take over-the-counter or prescription medicines for pain, discomfort, or fever as directed by your health care provider. °· Make sure you and your family fully understand everything about the medicines given to you, including what side effects may occur. °· You should not drink alcohol, take sleeping pills, or take medicines that cause drowsiness for at least 24 hours. °· If you smoke, do not smoke without  supervision. °· If you are feeling better, you may resume normal activities 24 hours after you were sedated. °· Keep all appointments with your health care provider. °SEEK MEDICAL CARE IF: °· Your skin is pale or bluish in color. °· You continue to feel nauseous or vomit. °· Your pain is getting worse and is not helped by medicine. °· You have bleeding or swelling. °· You are still sleepy or feeling clumsy after 24 hours. °SEEK IMMEDIATE MEDICAL CARE IF: °· You develop a rash. °· You have difficulty breathing. °· You develop any type of allergic problem. °· You have a fever. °MAKE SURE YOU: °· Understand these instructions. °· Will watch your condition. °· Will get help right away if you are not doing well or get worse. °Document Released: 03/19/2013 Document Reviewed: 03/19/2013 °[employer]® Patient Information ©2015 [employer]. This information is not intended to replace advice given to you by your health care provider. Make sure you discuss any questions you have with your health care provider. ° °

## 2014-03-26 NOTE — Op Note (Signed)
[facility] [place], [address]   ENDOSCOPY PROCEDURE REPORT  PATIENT: Katelyn [name], Katelyn [name]  MR#: [id number] BIRTHDATE: Sep 06, 1950 , 63  yrs. old GENDER: female ENDOSCOPIST: Milus Banister, MD PROCEDURE DATE:  03/26/2014 PROCEDURE:  EGD w/ biopsy ASA CLASS:     Class II INDICATIONS:  severe iron deficiency anemia, FOBT negative stool. MEDICATIONS: Residual sedation present TOPICAL ANESTHETIC: Cetacaine Spray  DESCRIPTION OF PROCEDURE: After the risks benefits and alternatives of the procedure were thoroughly explained, informed consent was obtained.  The Pentax Gastroscope [id number] endoscope was introduced through the mouth and advanced to the second portion of the duodenum , Without limitations.  The instrument was slowly withdrawn as the mucosa was fully examined.  There was a 5-6cm hiatal hernia with a single, small Cameron's type erosion.  The examination was otherwise normal.  The normal appearing duodenum was biopsied, sent to pathology.  Retroflexed views revealed no abnormalities.     The scope was then withdrawn from the patient and the procedure completed.  COMPLICATIONS: There were no immediate complications.  ENDOSCOPIC IMPRESSION: There was a 5-6cm hiatal hernia with a single, small Cameron's type erosion.  The examination was otherwise normal.  The normal appearing duodenum was biopsied, sent to pathology  RECOMMENDATIONS: Await final pathology from duodenum.  Your blood counts have completely normalized on dairly iron.  You should continue that indefinitely.  No further GI workup given hemocult negative stools.    eSigned:  Milus Banister, MD 03/26/2014 1:48 PM    CC:  Barnett Hatter, MD

## 2014-03-26 NOTE — Op Note (Signed)
[facility] [place], [address]   COLONOSCOPY PROCEDURE REPORT  PATIENT: Katelyn [name], Katelyn [name]  MR#: [id number] BIRTHDATE: 20-Feb-1951 , 63  yrs. old GENDER: female ENDOSCOPIST: Milus Banister, MD REFERRED BY: Barnett Hatter, NP PROCEDURE DATE:  03/26/2014 PROCEDURE:   Colonoscopy, diagnostic First Screening Colonoscopy - Avg.  risk and is 50 yrs.  old or older - No.  Prior Negative Screening - Now for repeat screening. 10 or more years since last screening  History of Adenoma - Now for follow-up colonoscopy & has been > or = to 3 yrs.  N/A  Polyps Removed Today? No.  Recommend repeat exam, <10 yrs? No. ASA CLASS:   Class III INDICATIONS:severe iron deficiency anemia, FOBT negative stool. MEDICATIONS: Fentanyl 100 mcg IV and Versed 10 mg IV  DESCRIPTION OF PROCEDURE:   After the risks benefits and alternatives of the procedure were thoroughly explained, informed consent was obtained.  The digital rectal exam revealed no abnormalities of the rectum.   The Pentax Ped Colon [id number] endoscope was introduced through the anus and advanced to the cecum, which was identified by both the appendix and ileocecal valve. No adverse events experienced.   The quality of the prep was excellent.  The instrument was then slowly withdrawn as the colon was fully examined.   COLON FINDINGS: There was mild diverticulosis noted in the left colon.   The examination was otherwise normal.  Retroflexed views revealed no abnormalities. The time to cecum=4 minutes 00 seconds. Withdrawal time=7 minutes 00 seconds.  The scope was withdrawn and the procedure completed. COMPLICATIONS: There were no immediate complications.  ENDOSCOPIC IMPRESSION: 1.   Mild diverticulosis was noted in the left colon 2.   The examination was otherwise normal, no polyps or cancers  RECOMMENDATIONS: You should continue to follow colorectal cancer screening guidelines for "routine risk" patients with  a repeat colonoscopy in 10 years.  Will proceed with EGD now.  eSigned:  Milus Banister, MD 03/26/2014 1:43 PM

## 2014-03-27 ENCOUNTER — Encounter (HOSPITAL_COMMUNITY): Payer: Self-pay | Admitting: Gastroenterology

## 2014-04-14 ENCOUNTER — Other Ambulatory Visit: Payer: Self-pay

## 2014-04-14 DIAGNOSIS — D649 Anemia, unspecified: Secondary | ICD-10-CM

## 2014-06-15 ENCOUNTER — Telehealth: Payer: Self-pay

## 2014-06-15 NOTE — Telephone Encounter (Signed)
No voicemail set up 

## 2014-06-15 NOTE — Telephone Encounter (Signed)
-----   Message from Barron Alvine, [place] sent at 04/14/2014  1:44 PM EST ----- Pt to get labs and have follow up, labs in EPIC pt to call and set up appt

## 2014-06-15 NOTE — Telephone Encounter (Signed)
Pt has been set up for follow up and will have labs within the next few weeks

## 2014-07-29 ENCOUNTER — Other Ambulatory Visit (INDEPENDENT_AMBULATORY_CARE_PROVIDER_SITE_OTHER): Payer: BLUE CROSS/BLUE SHIELD

## 2014-07-29 DIAGNOSIS — D649 Anemia, unspecified: Secondary | ICD-10-CM | POA: Diagnosis not present

## 2014-07-29 LAB — CBC WITH DIFFERENTIAL/PLATELET
BASOS PCT: 0.8 % (ref 0.0–3.0)
Basophils Absolute: 0 10*3/uL (ref 0.0–0.1)
EOS ABS: 0.1 10*3/uL (ref 0.0–0.7)
Eosinophils Relative: 2.2 % (ref 0.0–5.0)
HCT: 41.4 % (ref 36.0–46.0)
HEMOGLOBIN: 14.2 g/dL (ref 12.0–15.0)
Lymphocytes Relative: 39.2 % (ref 12.0–46.0)
Lymphs Abs: 2.6 10*3/uL (ref 0.7–4.0)
MCHC: 34.2 g/dL (ref 30.0–36.0)
MCV: 88.1 fl (ref 78.0–100.0)
MONOS PCT: 7.7 % (ref 3.0–12.0)
Monocytes Absolute: 0.5 10*3/uL (ref 0.1–1.0)
Neutro Abs: 3.3 10*3/uL (ref 1.4–7.7)
Neutrophils Relative %: 50.1 % (ref 43.0–77.0)
Platelets: 237 10*3/uL (ref 150.0–400.0)
RBC: 4.7 Mil/uL (ref 3.87–5.11)
RDW: 13.2 % (ref 11.5–15.5)
WBC: 6.6 10*3/uL (ref 4.0–10.5)

## 2014-07-29 LAB — IBC PANEL
Iron: 42 ug/dL (ref 42–145)
Saturation Ratios: 11.1 % — ABNORMAL LOW (ref 20.0–50.0)
Transferrin: 270 mg/dL (ref 212.0–360.0)

## 2014-07-29 LAB — FERRITIN: FERRITIN: 45.2 ng/mL (ref 10.0–291.0)

## 2014-08-03 ENCOUNTER — Encounter: Payer: Self-pay | Admitting: Gastroenterology

## 2014-08-03 ENCOUNTER — Ambulatory Visit (INDEPENDENT_AMBULATORY_CARE_PROVIDER_SITE_OTHER): Payer: BLUE CROSS/BLUE SHIELD | Admitting: Gastroenterology

## 2014-08-03 VITALS — BP 126/78 | HR 60 | Ht 65.5 in | Wt 207.5 lb

## 2014-08-03 DIAGNOSIS — D509 Iron deficiency anemia, unspecified: Secondary | ICD-10-CM

## 2014-08-03 NOTE — Progress Notes (Signed)
Review of pertinent gastrointestinal problems: 1. Severe IDA; hemocult negative: 2015 Hb 6.9, MCV 69;  Improved after iron supplement. Perhaps due to [facility], Cameron's erosion (however FOBT negative). Celiac panel negative. 2. Routine risk for colon cancer: 03/2014 Colonoscopy Dr. Ardis Hughs showed diverticulosis, otherwise normal.  Recall colonoscopy 10 years for screening. 3. HH, camerons erosions: EGD 03/2014 Dr. Ardis Hughs   HPI: This is a very pleasant 64 year old woman whom I last saw couple months ago time of colonoscopy and upper endoscopy.    Has been taking iron once daily.  Chronic loose stools; non bloody, only after eating.  Worse since GI procedures.  Tingling in fingers and toes at times.  Takes potassium periodically.    Past Medical History  Diagnosis Date  . ANXIETY DEPRESSION 04/14/2009  . Atrial fibrillation 04/14/2009  . GERD 09/07/2009  . HYPERLIPIDEMIA 09/07/2009  . HYPERTENSION 07/02/2009  . KNEE PAIN, RIGHT 12/07/2009  . MIGRAINE UNSP W/O INTRACT W/O STATUS MIGRAINOSUS 07/02/2009  . Anemia   . Recurrent UTI     Past Surgical History  Procedure Laterality Date  . Appendectomy  1964  . Abdominal hysterectomy  1997  . Septorhinoplasty  1979  . Colonoscopy N/A 03/26/2014    Procedure: COLONOSCOPY;  Surgeon: Milus Banister, MD;  Location: [facility];  Service: Endoscopy;  Laterality: N/A;  . Esophagogastroduodenoscopy N/A 03/26/2014    Procedure: ESOPHAGOGASTRODUODENOSCOPY (EGD);  Surgeon: Milus Banister, MD;  Location: [facility];  Service: Endoscopy;  Laterality: N/A;    Current Outpatient Prescriptions  Medication Sig Dispense Refill  . amLODipine (NORVASC) 5 MG tablet Take 1 tablet (5 mg total) by mouth daily. 30 tablet 3  . cetirizine (ZYRTEC) 10 MG tablet Take 1 tablet (10 mg total) by mouth daily. 30 tablet 5  . Ferrous Sulfate (IRON) 325 (65 FE) MG TABS Take 325 mg by mouth 2 (two) times daily. (Patient taking differently: Take 325 mg by mouth daily. ) 60  each 3  . FLUoxetine (PROZAC) 20 MG capsule Take 40 mg by mouth daily.    . propranolol (INDERAL) 80 MG tablet Take 1 tablet (80 mg total) by mouth 3 (three) times daily. 270 tablet 3  . sulfamethoxazole-trimethoprim (BACTRIM DS,SEPTRA DS) 800-160 MG per tablet Take 1 tablet by mouth 2 (two) times daily.    . [DISCONTINUED] atorvastatin (LIPITOR) 20 MG tablet Take 20 mg by mouth daily.      . [DISCONTINUED] benazepril (LOTENSIN) 20 MG tablet Take 20 mg by mouth daily.      . [DISCONTINUED] omeprazole (PRILOSEC OTC) 20 MG tablet Take 20 mg by mouth daily.      . [DISCONTINUED] valsartan-hydrochlorothiazide (DIOVAN-HCT) 160-12.5 MG per tablet Take 1 tablet by mouth daily.      . [DISCONTINUED] Venlafaxine HCl 225 MG TB24 Take by mouth daily.       No current facility-administered medications for this visit.    Allergies as of 08/03/2014 - Review Complete 08/03/2014  Allergen Reaction Noted  . Codeine Nausea And Vomiting   . Meperidine hcl Other (See Comments)   . Morphine Itching and Nausea And Vomiting     Family History  Problem Relation Age of Onset  . Hypertension Mother   . Breast cancer Mother   . Hypertension Father   . Lung cancer Father     textiles and smoker  . Hypertension Paternal Grandmother   . Hypertension Paternal Grandfather     History   Social History  . Marital Status: Divorced    Spouse  Name: N/A  . Number of Children: 1  . Years of Education: N/A   Occupational History  . RN    Social History Main Topics  . Smoking status: Never Smoker   . Smokeless tobacco: Never Used  . Alcohol Use: Yes     Comment: social occasional  . Drug Use: No  . Sexual Activity: Not on file   Other Topics Concern  . Not on file   Social History Narrative   Previously worked at [employer], has been stressed by 12 years of separation with husband who recently stopped paying alimony.  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        Physical Exam: BP 126/78  mmHg  Pulse 60  Ht 5' 5.5" (1.664 m)  Wt 207 lb 8 oz (94.121 kg)  BMI 33.99 kg/m2 Constitutional: generally well-appearing Psychiatric: alert and oriented x3 Abdomen: soft, nontender, nondistended, no obvious ascites, no peritoneal signs, normal bowel sounds     Assessment and plan: 64 y.o. female with iron deficiency anemia, Hemoccult negative, perhaps related to Cameron's erosions  Her iron deficiency has completely resolved since she has been taking iron on a daily basis. She has had some looser than usual stools, this really seems like it started around the time of her taking omeprazole daily. She is going to stop the omeprazole and instead take ranitidine 150 mg once daily. She will call to report on her symptoms in 4 weeks and sooner if needed. If she is still bothered by significant loose stools I will likely recommend an Imodium pill once daily.

## 2014-08-03 NOTE — Patient Instructions (Addendum)
Stay on once daily iron supplement. Follow up as needed. Stop omeprazole for now. Instead start zantac (150mg  once daily). Call in 4 weeks to report on your diarrhea.

## 2014-09-09 ENCOUNTER — Encounter: Payer: Self-pay | Admitting: Internal Medicine

## 2014-09-09 ENCOUNTER — Ambulatory Visit: Payer: BLUE CROSS/BLUE SHIELD | Attending: Internal Medicine | Admitting: Internal Medicine

## 2014-09-09 VITALS — BP 149/85 | HR 50 | Temp 97.5°F | Resp 16 | Ht 68.0 in | Wt 209.0 lb

## 2014-09-09 DIAGNOSIS — I10 Essential (primary) hypertension: Secondary | ICD-10-CM

## 2014-09-09 DIAGNOSIS — F329 Major depressive disorder, single episode, unspecified: Secondary | ICD-10-CM | POA: Insufficient documentation

## 2014-09-09 DIAGNOSIS — N390 Urinary tract infection, site not specified: Secondary | ICD-10-CM | POA: Insufficient documentation

## 2014-09-09 DIAGNOSIS — G43009 Migraine without aura, not intractable, without status migrainosus: Secondary | ICD-10-CM | POA: Diagnosis not present

## 2014-09-09 DIAGNOSIS — I4891 Unspecified atrial fibrillation: Secondary | ICD-10-CM | POA: Insufficient documentation

## 2014-09-09 DIAGNOSIS — F32A Depression, unspecified: Secondary | ICD-10-CM

## 2014-09-09 MED ORDER — PROPRANOLOL HCL 80 MG PO TABS: 80.0000 mg | ORAL_TABLET | Freq: Three times a day (TID) | ORAL | Status: DC

## 2014-09-09 MED ORDER — SULFAMETHOXAZOLE-TRIMETHOPRIM 800-160 MG PO TABS: 1.0000 | ORAL_TABLET | Freq: Two times a day (BID) | ORAL | Status: DC

## 2014-09-09 MED ORDER — AMLODIPINE BESYLATE 5 MG PO TABS: 5.0000 mg | ORAL_TABLET | Freq: Every day | ORAL | Status: DC

## 2014-09-09 MED ORDER — SUMATRIPTAN SUCCINATE 100 MG PO TABS: 100.0000 mg | ORAL_TABLET | Freq: Two times a day (BID) | ORAL | Status: DC | PRN

## 2014-09-09 MED ORDER — FLUOXETINE HCL 20 MG PO CAPS: 40.0000 mg | ORAL_CAPSULE | Freq: Every day | ORAL | Status: DC

## 2014-09-09 NOTE — Progress Notes (Signed)
Patient ID: Katelyn [name], female   DOB: 11-29-1950, 64 y.o.   MRN: [id number]  CC: HTN f/u   HPI: Katelyn [name] is a 64 y.o. female here today for a follow up visit.  Patient has past medical history of HTN, A. Fib, HLD, and anemia. Patient has not been seen here in one year. Katelyn [name] reports that Katelyn [name] needs medications refills.  Katelyn [name] admits to taking blood pressure medication daily. States that A.fib was a one time deal and Katelyn [name] converted back to NSR on her own. Katelyn [name] takes 325 mg of aspirin daily. Usually has low heart rate since Katelyn [name] began the high dose of inderal 7 years ago. Katelyn [name] reports that Katelyn [name] was placed on inderal for migraine prophylaxis by her neurologist. Katelyn [name] has rare migraines usually once every 6-8 weeks. Katelyn [name] states that the imitrex works well as abortive therapy and Katelyn [name] would like a refill.  Takes her BP medication at night. Katelyn [name] checks her BP at home and it is usually 130/70's.   Patient has No chest pain, No abdominal pain - No Nausea, No new weakness tingling or numbness, No Cough - SOB.  Allergies  Allergen Reactions  . Codeine Nausea And Vomiting  . Meperidine Hcl Other (See Comments)    unknown  . Morphine Itching and Nausea And Vomiting   Past Medical History  Diagnosis Date  . ANXIETY DEPRESSION 04/14/2009  . Atrial fibrillation 04/14/2009  . GERD 09/07/2009  . HYPERLIPIDEMIA 09/07/2009  . HYPERTENSION 07/02/2009  . KNEE PAIN, RIGHT 12/07/2009  . MIGRAINE UNSP W/O INTRACT W/O STATUS MIGRAINOSUS 07/02/2009  . Anemia   . Recurrent UTI    Current Outpatient Prescriptions on File Prior to Visit  Medication Sig Dispense Refill  . amLODipine (NORVASC) 5 MG tablet Take 1 tablet (5 mg total) by mouth daily. 30 tablet 3  . Ferrous Sulfate (IRON) 325 (65 FE) MG TABS Take 325 mg by mouth 2 (two) times daily. (Patient taking differently: Take 325 mg by mouth daily. ) 60 each 3  . FLUoxetine (PROZAC) 20 MG capsule Take 40 mg by mouth daily.    Marland Kitchen omeprazole (PRILOSEC OTC) 20 MG tablet Take  20 mg by mouth daily.    . propranolol (INDERAL) 80 MG tablet Take 1 tablet (80 mg total) by mouth 3 (three) times daily. 270 tablet 3  . sulfamethoxazole-trimethoprim (BACTRIM DS,SEPTRA DS) 800-160 MG per tablet Take 1 tablet by mouth 2 (two) times daily.    . cetirizine (ZYRTEC) 10 MG tablet Take 1 tablet (10 mg total) by mouth daily. (Patient not taking: Reported on 09/09/2014) 30 tablet 5  . [DISCONTINUED] atorvastatin (LIPITOR) 20 MG tablet Take 20 mg by mouth daily.      . [DISCONTINUED] benazepril (LOTENSIN) 20 MG tablet Take 20 mg by mouth daily.      . [DISCONTINUED] valsartan-hydrochlorothiazide (DIOVAN-HCT) 160-12.5 MG per tablet Take 1 tablet by mouth daily.      . [DISCONTINUED] Venlafaxine HCl 225 MG TB24 Take by mouth daily.       No current facility-administered medications on file prior to visit.   Family History  Problem Relation Age of Onset  . Hypertension Mother   . Breast cancer Mother   . Hypertension Father   . Lung cancer Father     textiles and smoker  . Hypertension Paternal Grandmother   . Hypertension Paternal Grandfather    History   Social History  . Marital Status: Divorced    Spouse Name: N/A  . Number of  Children: 1  . Years of Education: N/A   Occupational History  . RN    Social History Main Topics  . Smoking status: Never Smoker   . Smokeless tobacco: Never Used  . Alcohol Use: Yes     Comment: social occasional  . Drug Use: No  . Sexual Activity: Not on file   Other Topics Concern  . Not on file   Social History Narrative   Previously worked at [employer], has been stressed by 12 years of separation with husband who recently stopped paying alimony.  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      Review of Systems  Eyes: Negative for blurred vision.  Respiratory: Negative.   Cardiovascular: Negative.   Neurological: Positive for headaches. Negative for dizziness.  All other systems reviewed and are negative.  .     Objective:   Filed Vitals:   09/09/14 1433  BP: 149/85  Pulse: 50  Temp: 97.5 F (36.4 C)  Resp: 16    Physical Exam  Constitutional: Katelyn [name] is oriented to person, place, and time.  Cardiovascular: Normal rate, regular rhythm and normal heart sounds.   Pulmonary/Chest: Effort normal and breath sounds normal.  Musculoskeletal: Katelyn [name] exhibits no edema.  Neurological: Katelyn [name] is alert and oriented to person, place, and time.  Skin: Skin is warm and dry.     Lab Results  Component Value Date   WBC 6.6 07/29/2014   HGB 14.2 07/29/2014   HCT 41.4 07/29/2014   MCV 88.1 07/29/2014   PLT 237.0 07/29/2014   Lab Results  Component Value Date   CREATININE 1.0 03/18/2014   BUN 13 03/18/2014   NA 139 03/18/2014   K 4.4 03/18/2014   CL 104 03/18/2014   CO2 23 03/18/2014    Lab Results  Component Value Date   HGBA1C 5.7* 04/03/2013   Lipid Panel     Component Value Date/Time   CHOL 243* 04/03/2013 1215   TRIG 218* 04/03/2013 1215   HDL 48 04/03/2013 1215   CHOLHDL 5.1 04/03/2013 1215   VLDL 44* 04/03/2013 1215   LDLCALC 151* 04/03/2013 1215       Assessment and plan:   Katelyn [name] was seen today for follow-up.  Diagnoses and all orders for this visit:  Essential hypertension Orders: -     amLODipine (NORVASC) 5 MG tablet; Take 1 tablet (5 mg total) by mouth daily. -     Lipid panel; Future Patient takes medication at night, BP may be elevated due to dosing schedule   Atrial fibrillation, unspecified Orders: -     propranolol (INDERAL) 80 MG tablet; Take 1 tablet (80 mg total) by mouth 3 (three) times daily. Has not had palpitations or been in A. Fibs since first time. Patient will stay on 325 mg of aspirin daily  Migraine without aura and without status migrainosus, not intractable Orders: -     propranolol (INDERAL) 80 MG tablet; Take 1 tablet (80 mg total) by mouth 3 (three) times daily. -     SUMAtriptan (IMITREX) 100 MG tablet; Take 1 tablet (100 mg total) by  mouth 2 (two) times daily as needed for migraine.  Depression Orders: -     FLUoxetine (PROZAC) 20 MG capsule; Take 2 capsules (40 mg total) by mouth daily.  Recurrent UTI Orders: -     sulfamethoxazole-trimethoprim (BACTRIM DS,SEPTRA DS) 800-160 MG per tablet; Take 1 tablet by mouth 2 (two) times daily. May continue prophylactic therapy  Return in about 6 months (around 03/12/2015) for Hypertension and 1 week , Lab Visit.        Chari Manning, NP-C [facility] [phone] 09/09/2014, 3:03 PM

## 2014-09-09 NOTE — Progress Notes (Signed)
Pt is following up on her HTN. Pt is here needing her medications refilled. Pt has no c.c. Today.

## 2014-09-09 NOTE — Patient Instructions (Signed)
DASH Eating Plan °DASH stands for "Dietary Approaches to Stop Hypertension." The DASH eating plan is a healthy eating plan that has been shown to reduce high blood pressure (hypertension). Additional health benefits may include reducing the risk of type 2 diabetes mellitus, heart disease, and stroke. The DASH eating plan may also help with weight loss. °WHAT DO I NEED TO KNOW ABOUT THE DASH EATING PLAN? °For the DASH eating plan, you will follow these general guidelines: °· Choose foods with a percent daily value for sodium of less than 5% (as listed on the food label). °· Use salt-free seasonings or herbs instead of table salt or sea salt. °· Check with your health care provider or pharmacist before using salt substitutes. °· Eat lower-sodium products, often labeled as "lower sodium" or "no salt added." °· Eat fresh foods. °· Eat more vegetables, fruits, and low-fat dairy products. °· Choose whole grains. Look for the word "whole" as the first word in the ingredient list. °· Choose fish and skinless chicken or turkey more often than red meat. Limit fish, poultry, and meat to 6 oz (170 g) each day. °· Limit sweets, desserts, sugars, and sugary drinks. °· Choose heart-healthy fats. °· Limit cheese to 1 oz (28 g) per day. °· Eat more home-cooked food and less restaurant, buffet, and fast food. °· Limit fried foods. °· Cook foods using methods other than frying. °· Limit canned vegetables. If you do use them, rinse them well to decrease the sodium. °· When eating at a restaurant, ask that your food be prepared with less salt, or no salt if possible. °WHAT FOODS CAN I EAT? °Seek help from a dietitian for individual calorie needs. °Grains °Whole grain or whole wheat bread. Brown rice. Whole grain or whole wheat pasta. Quinoa, bulgur, and whole grain cereals. Low-sodium cereals. Corn or whole wheat flour tortillas. Whole grain cornbread. Whole grain crackers. Low-sodium crackers. °Vegetables °Fresh or frozen vegetables  (raw, steamed, roasted, or grilled). Low-sodium or reduced-sodium tomato and vegetable juices. Low-sodium or reduced-sodium tomato sauce and paste. Low-sodium or reduced-sodium canned vegetables.  °Fruits °All fresh, canned (in natural juice), or frozen fruits. °Meat and Other Protein Products °Ground beef (85% or leaner), grass-fed beef, or beef trimmed of fat. Skinless chicken or turkey. Ground chicken or turkey. Pork trimmed of fat. All fish and seafood. Eggs. Dried beans, peas, or lentils. Unsalted nuts and seeds. Unsalted canned beans. °Dairy °Low-fat dairy products, such as skim or 1% milk, 2% or reduced-fat cheeses, low-fat ricotta or cottage cheese, or plain low-fat yogurt. Low-sodium or reduced-sodium cheeses. °Fats and Oils °Tub margarines without trans fats. Light or reduced-fat mayonnaise and salad dressings (reduced sodium). Avocado. Safflower, olive, or canola oils. Natural peanut or almond butter. °Other °Unsalted popcorn and pretzels. °The items listed above may not be a complete list of recommended foods or beverages. Contact your dietitian for more options. °WHAT FOODS ARE NOT RECOMMENDED? °Grains °White bread. White pasta. White rice. Refined cornbread. Bagels and croissants. Crackers that contain trans fat. °Vegetables °Creamed or fried vegetables. Vegetables in a cheese sauce. Regular canned vegetables. Regular canned tomato sauce and paste. Regular tomato and vegetable juices. °Fruits °Dried fruits. Canned fruit in light or heavy syrup. Fruit juice. °Meat and Other Protein Products °Fatty cuts of meat. Ribs, chicken wings, bacon, sausage, bologna, salami, chitterlings, fatback, hot dogs, bratwurst, and packaged luncheon meats. Salted nuts and seeds. Canned beans with salt. °Dairy °Whole or 2% milk, cream, half-and-half, and cream cheese. Whole-fat or sweetened yogurt. Full-fat   cheeses or blue cheese. Nondairy creamers and whipped toppings. Processed cheese, cheese spreads, or cheese  curds. °Condiments °Onion and garlic salt, seasoned salt, table salt, and sea salt. Canned and packaged gravies. Worcestershire sauce. Tartar sauce. Barbecue sauce. Teriyaki sauce. Soy sauce, including reduced sodium. Steak sauce. Fish sauce. Oyster sauce. Cocktail sauce. Horseradish. Ketchup and mustard. Meat flavorings and tenderizers. Bouillon cubes. Hot sauce. Tabasco sauce. Marinades. Taco seasonings. Relishes. °Fats and Oils °Butter, stick margarine, lard, shortening, ghee, and bacon fat. Coconut, palm kernel, or palm oils. Regular salad dressings. °Other °Pickles and olives. Salted popcorn and pretzels. °The items listed above may not be a complete list of foods and beverages to avoid. Contact your dietitian for more information. °WHERE CAN I FIND MORE INFORMATION? °National Heart, Lung, and [employer] Institute: www.nhlbi.nih.gov/health/health-topics/topics/dash/ °Document Released: 05/18/2011 Document Revised: 10/13/2013 Document Reviewed: 04/02/2013 °Exit[employer]® Patient Information ©2015 [employer]. This information is not intended to replace advice given to you by your health care provider. Make sure you discuss any questions you have with your health care provider. ° °

## 2014-09-16 ENCOUNTER — Ambulatory Visit: Payer: BLUE CROSS/BLUE SHIELD | Attending: Internal Medicine

## 2014-09-16 DIAGNOSIS — I10 Essential (primary) hypertension: Secondary | ICD-10-CM

## 2014-09-16 LAB — LIPID PANEL
CHOL/HDL RATIO: 5.7 ratio
CHOLESTEROL: 235 mg/dL — AB (ref 0–200)
HDL: 41 mg/dL — ABNORMAL LOW (ref >=46)
LDL Cholesterol: 157 mg/dL — ABNORMAL HIGH (ref 0–99)
Triglycerides: 185 mg/dL — ABNORMAL HIGH (ref <150)
VLDL: 37 mg/dL (ref 0–40)

## 2014-09-21 ENCOUNTER — Telehealth: Payer: Self-pay | Admitting: *Deleted

## 2014-09-21 NOTE — Telephone Encounter (Signed)
-----   Message from Lance Bosch, NP sent at 09/18/2014 10:07 PM EDT ----- Please send atorvastatin 20 mg to take dialy. Her cholesterol is way too high. Please explain why cholesterol control is important. GO over diet and exercise with patient.

## 2014-09-21 NOTE — Telephone Encounter (Signed)
Unable to leave message voicemail not set up. 

## 2014-10-19 ENCOUNTER — Other Ambulatory Visit: Payer: Self-pay | Admitting: Internal Medicine

## 2014-10-19 DIAGNOSIS — Z1231 Encounter for screening mammogram for malignant neoplasm of breast: Secondary | ICD-10-CM

## 2014-10-21 ENCOUNTER — Ambulatory Visit (HOSPITAL_COMMUNITY)
Admission: RE | Admit: 2014-10-21 | Discharge: 2014-10-21 | Disposition: A | Discharge status: Home / Self Care | Payer: BLUE CROSS/BLUE SHIELD | Source: Ambulatory Visit | Attending: Internal Medicine | Admitting: Internal Medicine

## 2014-10-21 DIAGNOSIS — Z1231 Encounter for screening mammogram for malignant neoplasm of breast: Secondary | ICD-10-CM | POA: Diagnosis present

## 2014-11-11 ENCOUNTER — Telehealth: Payer: Self-pay | Admitting: *Deleted

## 2014-11-11 NOTE — Telephone Encounter (Signed)
-----   Message from Lance Bosch, NP sent at 11/09/2014  1:02 PM EDT ----- Normal mammogram repeat in one year

## 2014-11-11 NOTE — Telephone Encounter (Signed)
Unable to leave message because patient's voicemail has not been set up yet.

## 2014-11-15 ENCOUNTER — Emergency Department (HOSPITAL_COMMUNITY)
Admission: EM | Admit: 2014-11-15 | Discharge: 2014-11-15 | Disposition: A | Discharge status: Home / Self Care | Payer: BLUE CROSS/BLUE SHIELD | Attending: Emergency Medicine | Admitting: Emergency Medicine

## 2014-11-15 ENCOUNTER — Emergency Department (HOSPITAL_COMMUNITY): Payer: BLUE CROSS/BLUE SHIELD

## 2014-11-15 ENCOUNTER — Encounter (HOSPITAL_COMMUNITY): Payer: Self-pay

## 2014-11-15 DIAGNOSIS — G43909 Migraine, unspecified, not intractable, without status migrainosus: Secondary | ICD-10-CM | POA: Insufficient documentation

## 2014-11-15 DIAGNOSIS — D649 Anemia, unspecified: Secondary | ICD-10-CM | POA: Diagnosis not present

## 2014-11-15 DIAGNOSIS — K219 Gastro-esophageal reflux disease without esophagitis: Secondary | ICD-10-CM | POA: Diagnosis not present

## 2014-11-15 DIAGNOSIS — Z79899 Other long term (current) drug therapy: Secondary | ICD-10-CM | POA: Diagnosis not present

## 2014-11-15 DIAGNOSIS — I10 Essential (primary) hypertension: Secondary | ICD-10-CM | POA: Diagnosis not present

## 2014-11-15 DIAGNOSIS — Z7982 Long term (current) use of aspirin: Secondary | ICD-10-CM | POA: Insufficient documentation

## 2014-11-15 DIAGNOSIS — Z87440 Personal history of urinary (tract) infections: Secondary | ICD-10-CM | POA: Insufficient documentation

## 2014-11-15 DIAGNOSIS — F329 Major depressive disorder, single episode, unspecified: Secondary | ICD-10-CM | POA: Diagnosis not present

## 2014-11-15 DIAGNOSIS — Z8639 Personal history of other endocrine, nutritional and metabolic disease: Secondary | ICD-10-CM | POA: Insufficient documentation

## 2014-11-15 DIAGNOSIS — Z792 Long term (current) use of antibiotics: Secondary | ICD-10-CM | POA: Diagnosis not present

## 2014-11-15 DIAGNOSIS — F419 Anxiety disorder, unspecified: Secondary | ICD-10-CM | POA: Diagnosis not present

## 2014-11-15 DIAGNOSIS — I4891 Unspecified atrial fibrillation: Secondary | ICD-10-CM | POA: Diagnosis not present

## 2014-11-15 LAB — BASIC METABOLIC PANEL
ANION GAP: 9 (ref 5–15)
BUN: 10 mg/dL (ref 6–20)
CO2: 23 mmol/L (ref 22–32)
Calcium: 9.2 mg/dL (ref 8.9–10.3)
Chloride: 107 mmol/L (ref 101–111)
Creatinine, Ser: 0.72 mg/dL (ref 0.44–1.00)
GFR calc non Af Amer: >60 mL/min (ref >60)
Glucose, Bld: 93 mg/dL (ref 65–99)
POTASSIUM: 3.3 mmol/L — AB (ref 3.5–5.1)
Sodium: 139 mmol/L (ref 135–145)

## 2014-11-15 LAB — CBC WITH DIFFERENTIAL/PLATELET
Basophils Absolute: 0 10*3/uL (ref 0.0–0.1)
Basophils Relative: 1 % (ref 0–1)
EOS PCT: 2 % (ref 0–5)
Eosinophils Absolute: 0.1 10*3/uL (ref 0.0–0.7)
HEMATOCRIT: 35.7 % — AB (ref 36.0–46.0)
HEMOGLOBIN: 12.1 g/dL (ref 12.0–15.0)
LYMPHS ABS: 2 10*3/uL (ref 0.7–4.0)
Lymphocytes Relative: 33 % (ref 12–46)
MCH: 29.5 pg (ref 26.0–34.0)
MCHC: 33.9 g/dL (ref 30.0–36.0)
MCV: 87.1 fL (ref 78.0–100.0)
MONO ABS: 0.4 10*3/uL (ref 0.1–1.0)
Monocytes Relative: 7 % (ref 3–12)
NEUTROS PCT: 57 % (ref 43–77)
Neutro Abs: 3.4 10*3/uL (ref 1.7–7.7)
Platelets: 217 10*3/uL (ref 150–400)
RBC: 4.1 MIL/uL (ref 3.87–5.11)
RDW: 12.6 % (ref 11.5–15.5)
WBC: 6 10*3/uL (ref 4.0–10.5)

## 2014-11-15 LAB — I-STAT TROPONIN, ED: Troponin i, poc: 0.03 ng/mL (ref 0.00–0.08)

## 2014-11-15 MED ORDER — LOSARTAN POTASSIUM 50 MG PO TABS: 50.0000 mg | ORAL_TABLET | Freq: Every day | ORAL | Status: DC

## 2014-11-15 MED ORDER — POTASSIUM CHLORIDE CRYS ER 20 MEQ PO TBCR
20.0000 meq | EXTENDED_RELEASE_TABLET | Freq: Once | ORAL | Status: AC
  Administered 2014-11-15: 20 meq via ORAL
  Filled 2014-11-15: qty 1

## 2014-11-15 MED ORDER — DILTIAZEM HCL ER COATED BEADS 120 MG PO CP24: 120.0000 mg | ORAL_CAPSULE | Freq: Every day | ORAL | Status: DC

## 2014-11-15 NOTE — ED Notes (Addendum)
NP at bedside.

## 2014-11-15 NOTE — ED Notes (Signed)
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 Nursing staff on scene noted HR in the 180's, and elevated BP. Pt in afib for EMS. Upon arrival pt back in sinus rhythm. Pt denies any pain/discomfort. BP 152/120, 100 HR.

## 2014-11-15 NOTE — Discharge Instructions (Signed)
Discussed with the cardiologist.  Your situation is recommending that you take diltiazem 120 mg daily, losartan 50 mg daily.  Continue your aspirin and your other medications, but it is important that you stop taking the Norvasc again stopped taking Norvasc.  Make an appointment with your primary care physician for this week and make sure to please discuss a better anticoagulated and aspirin

## 2014-11-15 NOTE — ED Provider Notes (Signed)
CSN: [id number]     Arrival date & time 11/15/14  1909 History   First MD Initiated Contact with Patient 11/15/14 2012     Chief Complaint  Patient presents with  . Atrial Fibrillation     (Consider location/radiation/quality/duration/timing/severity/associated sxs/prior Treatment) HPI Comments: This is a 64 year old female with a history of intermittent atrial fibrillation that started approximately 5 years ago.  She was seen by Dr. Caryl Comes at that time who did not recommend any medications.  She is also been diagnosed with a hiatal hernia and GI bleed for which she takes Prilosec and iron supplementation, which she, states she's been taking on a regular basis.  Yesterday she states she had GI upset with some diarrhea, but felt better.  Today went to work while walking briskly.  She noticed that she was weak and very tachycardic.  She took her pulse and it was in the 180s.  Blood pressure was elevated as well.  She called EMS and was transported to the emergency department.  She is now in sinus rhythm.  Denies excessive use of caffeine.  No over-the-counter medications.  No nutrition supplements  Patient is a 64 y.o. female presenting with atrial fibrillation. The history is provided by the patient.  Atrial Fibrillation This is a recurrent problem. The current episode started today. The problem occurs rarely. The problem has been resolved. Associated symptoms include abdominal pain, a change in bowel habit and weakness. Pertinent negatives include no chest pain, coughing, fever or nausea. The symptoms are aggravated by walking. She has tried rest for the symptoms. The treatment provided significant relief.    Past Medical History  Diagnosis Date  . ANXIETY DEPRESSION 04/14/2009  . Atrial fibrillation 04/14/2009  . GERD 09/07/2009  . HYPERLIPIDEMIA 09/07/2009  . HYPERTENSION 07/02/2009  . KNEE PAIN, RIGHT 12/07/2009  . MIGRAINE UNSP W/O INTRACT W/O STATUS MIGRAINOSUS 07/02/2009  . Anemia   .  Recurrent UTI    Past Surgical History  Procedure Laterality Date  . Appendectomy  1964  . Abdominal hysterectomy  1997  . Septorhinoplasty  1979  . Colonoscopy N/A 03/26/2014    Procedure: COLONOSCOPY;  Surgeon: Milus Banister, MD;  Location: [facility];  Service: Endoscopy;  Laterality: N/A;  . Esophagogastroduodenoscopy N/A 03/26/2014    Procedure: ESOPHAGOGASTRODUODENOSCOPY (EGD);  Surgeon: Milus Banister, MD;  Location: [facility];  Service: Endoscopy;  Laterality: N/A;   Family History  Problem Relation Age of Onset  . Hypertension Mother   . Breast cancer Mother   . Hypertension Father   . Lung cancer Father     textiles and smoker  . Hypertension Paternal Grandmother   . Hypertension Paternal Grandfather    History  Substance Use Topics  . Smoking status: Never Smoker   . Smokeless tobacco: Never Used  . Alcohol Use: Yes     Comment: social occasional   OB History    No data available     Review of Systems  Constitutional: Negative for fever.  Respiratory: Negative for cough.   Cardiovascular: Negative for chest pain and leg swelling.  Gastrointestinal: Positive for abdominal pain, diarrhea and change in bowel habit. Negative for nausea and blood in stool.  Genitourinary: Negative for dysuria.  Neurological: Positive for weakness.  All other systems reviewed and are negative.     Allergies  Codeine; Lisinopril; Meperidine hcl; and Morphine  Home Medications   Prior to Admission medications   Medication Sig Start Date End Date Taking? Authorizing Provider  aspirin  325 MG EC tablet Take 325 mg by mouth daily.   Yes Historical Provider, MD  cetirizine (ZYRTEC) 10 MG tablet Take 1 tablet (10 mg total) by mouth daily. Patient taking differently: Take 10 mg by mouth as needed for allergies.  10/17/13  Yes Lance Bosch, NP  Ferrous Sulfate (IRON) 325 (65 FE) MG TABS Take 325 mg by mouth 2 (two) times daily. Patient taking differently: Take 325 mg by  mouth daily.  12/08/13  Yes Theodis Blaze, MD  FLUoxetine (PROZAC) 20 MG capsule Take 2 capsules (40 mg total) by mouth daily. 09/09/14  Yes Lance Bosch, NP  omeprazole (PRILOSEC OTC) 20 MG tablet Take 20 mg by mouth as needed (for heartburn).    Yes Historical Provider, MD  propranolol (INDERAL) 80 MG tablet Take 1 tablet (80 mg total) by mouth 3 (three) times daily. 09/09/14  Yes Lance Bosch, NP  sulfamethoxazole-trimethoprim (BACTRIM DS,SEPTRA DS) 800-160 MG per tablet Take 1 tablet by mouth 2 (two) times daily. 09/09/14  Yes Lance Bosch, NP  diltiazem (CARDIZEM CD) 120 MG 24 hr capsule Take 1 capsule (120 mg total) by mouth daily. 11/15/14   Junius Creamer, NP  losartan (COZAAR) 50 MG tablet Take 1 tablet (50 mg total) by mouth daily. 11/15/14   Junius Creamer, NP  SUMAtriptan (IMITREX) 100 MG tablet Take 1 tablet (100 mg total) by mouth 2 (two) times daily as needed for migraine. 09/09/14   Lance Bosch, NP   BP 127/77 mmHg  Pulse 62  Temp(Src) 97.8 F (36.6 C) (Oral)  Resp 13  SpO2 97% Physical Exam  Constitutional: She is oriented to person, place, and time. She appears well-developed and well-nourished.  HENT:  Head: Normocephalic.  Eyes: Pupils are equal, round, and reactive to light.  Neck: Normal range of motion.  Cardiovascular: Normal rate and regular rhythm.   Patient is in regular rhythm at this time.  Rate is 70  Pulmonary/Chest: Effort normal. No respiratory distress. She has no wheezes.  Abdominal: Soft. She exhibits no distension. There is no tenderness.  Musculoskeletal: Normal range of motion. She exhibits no edema or tenderness.  Neurological: She is alert and oriented to person, place, and time.  Skin: Skin is warm.  Nursing note and vitals reviewed.   ED Course  Procedures (including critical care time) Labs Review Labs Reviewed  CBC WITH DIFFERENTIAL/PLATELET - Abnormal; Notable for the following:    HCT 35.7 (*)    All other components within normal limits   BASIC METABOLIC PANEL - Abnormal; Notable for the following:    Potassium 3.3 (*)    All other components within normal limits  I-STAT TROPOININ, ED    Imaging Review Dg Chest 2 View  11/15/2014   CLINICAL DATA:  Initial evaluation for acute palpitations, dyspnea.  EXAM: CHEST  2 VIEW  COMPARISON:  Prior study from 12/06/2013  FINDINGS: Cardiomegaly is stable. Mediastinal silhouette within normal limits. Hiatal hernia noted.  The lungs are normally inflated. No airspace consolidation, pleural effusion, or pulmonary edema is identified. There is no pneumothorax.  No acute osseous abnormality identified.  IMPRESSION: 1. No active cardiopulmonary disease. 2. Stable cardiomegaly. 3. Hiatal hernia.   Electronically Signed   By: Jeannine Boga M.D.   On: 11/15/2014 23:15     EKG Interpretation   Date/Time:  Sunday November 15 2014 19:16:46 EDT Ventricular Rate:  95 PR Interval:  212 QRS Duration: 78 QT Interval:  380 QTC Calculation: 477 R  Axis:   36 Text Interpretation:  Sinus rhythm with 1st degree A-V block Septal  infarct , age undetermined Abnormal ECG No old tracing to compare  Confirmed by [facility]  MD, ELLIOTT [phone]) on 11/15/2014 9:42:40 PM     Discussed with cardiology to add diltizem, lorsartan and stop Norvasc MDM   Final diagnoses:  Atrial fibrillation         Junius Creamer, NP 11/16/14 2003  Daleen Bo, MD 11/17/14 [phone]

## 2014-11-23 ENCOUNTER — Ambulatory Visit: Payer: BLUE CROSS/BLUE SHIELD | Admitting: Internal Medicine

## 2014-11-24 ENCOUNTER — Emergency Department (HOSPITAL_COMMUNITY)
Admission: EM | Admit: 2014-11-24 | Discharge: 2014-11-24 | Disposition: A | Discharge status: Home / Self Care | Payer: BLUE CROSS/BLUE SHIELD | Source: Home / Self Care | Attending: Family Medicine | Admitting: Family Medicine

## 2014-11-24 ENCOUNTER — Encounter (HOSPITAL_COMMUNITY): Payer: Self-pay | Admitting: *Deleted

## 2014-11-24 DIAGNOSIS — N390 Urinary tract infection, site not specified: Secondary | ICD-10-CM

## 2014-11-24 LAB — POCT URINALYSIS DIP (DEVICE)
Glucose, UA: NEGATIVE mg/dL
Ketones, ur: 40 mg/dL — AB
Nitrite: NEGATIVE
Protein, ur: NEGATIVE mg/dL
Specific Gravity, Urine: 1.025 (ref 1.005–1.030)
Urobilinogen, UA: 0.2 mg/dL (ref 0.0–1.0)
pH: 7 (ref 5.0–8.0)

## 2014-11-24 MED ORDER — CEPHALEXIN 500 MG PO CAPS: 500.0000 mg | ORAL_CAPSULE | Freq: Four times a day (QID) | ORAL | Status: DC

## 2014-11-24 NOTE — Discharge Instructions (Signed)
Take all of medicine as directed, drink lots of fluids, see your doctor if further problems. °

## 2014-11-24 NOTE — ED Provider Notes (Signed)
CSN: [id number]     Arrival date & time 11/24/14  1700 History   First MD Initiated Contact with Patient 11/24/14 1754     Chief Complaint  Patient presents with  . Abdominal Pain   (Consider location/radiation/quality/duration/timing/severity/associated sxs/prior Treatment) Patient is a 64 y.o. female presenting with abdominal pain. The history is provided by the patient.  Abdominal Pain Pain location:  Suprapubic Pain quality: burning   Pain severity:  Mild Onset quality:  Gradual Duration:  1 day Chronicity:  Recurrent Context comment:  On septra ds prophylactic tretment Relieved by:  None tried Worsened by:  Nothing tried Ineffective treatments:  None tried Associated symptoms: dysuria   Associated symptoms: no fever   Risk factors comment:  Prior uti hx.   Past Medical History  Diagnosis Date  . ANXIETY DEPRESSION 04/14/2009  . Atrial fibrillation 04/14/2009  . GERD 09/07/2009  . HYPERLIPIDEMIA 09/07/2009  . HYPERTENSION 07/02/2009  . KNEE PAIN, RIGHT 12/07/2009  . MIGRAINE UNSP W/O INTRACT W/O STATUS MIGRAINOSUS 07/02/2009  . Anemia   . Recurrent UTI    Past Surgical History  Procedure Laterality Date  . Appendectomy  1964  . Abdominal hysterectomy  1997  . Septorhinoplasty  1979  . Colonoscopy N/A 03/26/2014    Procedure: COLONOSCOPY;  Surgeon: Milus Banister, MD;  Location: [facility];  Service: Endoscopy;  Laterality: N/A;  . Esophagogastroduodenoscopy N/A 03/26/2014    Procedure: ESOPHAGOGASTRODUODENOSCOPY (EGD);  Surgeon: Milus Banister, MD;  Location: [facility];  Service: Endoscopy;  Laterality: N/A;   Family History  Problem Relation Age of Onset  . Hypertension Mother   . Breast cancer Mother   . Hypertension Father   . Lung cancer Father     textiles and smoker  . Hypertension Paternal Grandmother   . Hypertension Paternal Grandfather    History  Substance Use Topics  . Smoking status: Never Smoker   . Smokeless tobacco: Never Used  .  Alcohol Use: Yes     Comment: social occasional   OB History    No data available     Review of Systems  Constitutional: Negative.  Negative for fever.  Gastrointestinal: Positive for abdominal pain.  Genitourinary: Positive for dysuria, urgency, frequency, flank pain and pelvic pain.    Allergies  Codeine; Lisinopril; Meperidine hcl; and Morphine  Home Medications   Prior to Admission medications   Medication Sig Start Date End Date Taking? Authorizing Provider  aspirin 325 MG EC tablet Take 325 mg by mouth daily.    Historical Provider, MD  cephALEXin (KEFLEX) 500 MG capsule Take 1 capsule (500 mg total) by mouth 4 (four) times daily. Take all of medicine and drink lots of fluids 11/24/14   Billy Fischer, MD  cetirizine (ZYRTEC) 10 MG tablet Take 1 tablet (10 mg total) by mouth daily. Patient taking differently: Take 10 mg by mouth as needed for allergies.  10/17/13   Lance Bosch, NP  diltiazem (CARDIZEM CD) 120 MG 24 hr capsule Take 1 capsule (120 mg total) by mouth daily. 11/15/14   Junius Creamer, NP  Ferrous Sulfate (IRON) 325 (65 FE) MG TABS Take 325 mg by mouth 2 (two) times daily. Patient taking differently: Take 325 mg by mouth daily.  12/08/13   Theodis Blaze, MD  FLUoxetine (PROZAC) 20 MG capsule Take 2 capsules (40 mg total) by mouth daily. 09/09/14   Lance Bosch, NP  losartan (COZAAR) 50 MG tablet Take 1 tablet (50 mg total) by mouth  daily. 11/15/14   Junius Creamer, NP  omeprazole (PRILOSEC OTC) 20 MG tablet Take 20 mg by mouth as needed (for heartburn).     Historical Provider, MD  propranolol (INDERAL) 80 MG tablet Take 1 tablet (80 mg total) by mouth 3 (three) times daily. 09/09/14   Lance Bosch, NP  sulfamethoxazole-trimethoprim (BACTRIM DS,SEPTRA DS) 800-160 MG per tablet Take 1 tablet by mouth 2 (two) times daily. 09/09/14   Lance Bosch, NP  SUMAtriptan (IMITREX) 100 MG tablet Take 1 tablet (100 mg total) by mouth 2 (two) times daily as needed for migraine. 09/09/14    Lance Bosch, NP   BP 144/92 mmHg  Pulse 78  Temp(Src) 100 F (37.8 C) (Oral)  Resp 16  SpO2 97% Physical Exam  Constitutional: She is oriented to person, place, and time. She appears well-developed and well-nourished. No distress.  Abdominal: Soft. Bowel sounds are normal. She exhibits no distension and no mass. There is no tenderness. There is no rebound and no guarding.  Neurological: She is oriented to person, place, and time.  Skin: Skin is warm and dry.  Nursing note and vitals reviewed.   ED Course  Procedures (including critical care time) Labs Review Labs Reviewed  POCT URINALYSIS DIP (DEVICE) - Abnormal; Notable for the following:    Bilirubin Urine SMALL (*)    Ketones, ur 40 (*)    Hgb urine dipstick TRACE (*)    Leukocytes, UA SMALL (*)    All other components within normal limits    Imaging Review No results found.   MDM   1. UTI (lower urinary tract infection)       Billy Fischer, MD 11/24/14 [phone]

## 2014-11-24 NOTE — ED Notes (Signed)
Pt  Reports         Nausea   And low  abd  Pain          Symptoms       Since   Yesterday   Pt  Reports  Pain  Is  Similar  To       Symptoms  She  Had   With  A  uti in  past

## 2014-12-11 ENCOUNTER — Ambulatory Visit (INDEPENDENT_AMBULATORY_CARE_PROVIDER_SITE_OTHER): Payer: BLUE CROSS/BLUE SHIELD | Admitting: Internal Medicine

## 2014-12-11 ENCOUNTER — Ambulatory Visit (INDEPENDENT_AMBULATORY_CARE_PROVIDER_SITE_OTHER)
Admission: RE | Admit: 2014-12-11 | Discharge: 2014-12-11 | Disposition: A | Discharge status: Home / Self Care | Payer: BLUE CROSS/BLUE SHIELD | Source: Ambulatory Visit | Attending: Internal Medicine | Admitting: Internal Medicine

## 2014-12-11 ENCOUNTER — Encounter: Payer: Self-pay | Admitting: Internal Medicine

## 2014-12-11 VITALS — BP 128/70 | HR 51 | Ht 68.0 in | Wt 207.1 lb

## 2014-12-11 DIAGNOSIS — R0602 Shortness of breath: Secondary | ICD-10-CM | POA: Diagnosis not present

## 2014-12-11 NOTE — Progress Notes (Signed)
Cardiology Office Note   Date:  12/11/2014   ID:  Katelyn [name], Katelyn [name] 07-Mar-1951, MRN [id number]  PCP:  Olga Millers, MD  Cardiologist:   Dorris Carnes, MD   No chief complaint on file.  Pt referred for evaluation of tachycardia     History of Present Illness: Katelyn [name] is a 64 y.o. female with a history of intermitt atrial fib  And HTN   She was seen in the ER on 6/5  Last spell prior to that was about 5 yars ago. Also a history of GI bleed in past.   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  The pt had one other spell of tachycardia 5 years ago  EMS call  They wanted to use adenosine  Pt refused  Converted prior to arrival to ER Echo done  LVEF normal  MOd diastolic dysfunciton.  Mild LAD    Current Outpatient Prescriptions  Medication Sig Dispense Refill  . aspirin 325 MG EC tablet Take 325 mg by mouth daily.    . cetirizine (ZYRTEC) 10 MG tablet Take 1 tablet (10 mg total) by mouth daily. (Patient taking differently: Take 10 mg by mouth as needed for allergies. ) 30 tablet 5  . diltiazem (CARDIZEM CD) 120 MG 24 hr capsule Take 1 capsule (120 mg total) by mouth daily. 30 capsule 0  . Ferrous Sulfate (IRON) 325 (65 FE) MG TABS Take 325 mg by mouth 2 (two) times daily. (Patient taking differently: Take 325 mg by mouth daily. ) 60 each 3  . FLUoxetine (PROZAC) 20 MG capsule Take 2 capsules (40 mg total) by mouth daily. 60 capsule 5  . losartan (COZAAR) 50 MG tablet Take 1 tablet (50 mg total) by mouth daily. 30 tablet 0  . omeprazole (PRILOSEC OTC) 20 MG tablet Take 20 mg by mouth as needed (for heartburn).     . propranolol (INDERAL) 80 MG  tablet Take 1 tablet (80 mg total) by mouth 3 (three) times daily. 270 tablet 5  . sulfamethoxazole-trimethoprim (BACTRIM DS,SEPTRA DS) 800-160 MG per tablet Take 1 tablet by mouth 2 (two) times daily. 60 tablet 5  . SUMAtriptan (IMITREX) 100 MG tablet Take 1 tablet (100 mg total) by mouth 2 (two) times daily as needed for migraine. 10 tablet 3  . [DISCONTINUED] atorvastatin (LIPITOR) 20 MG tablet Take 20 mg by mouth daily.      . [DISCONTINUED] benazepril (LOTENSIN) 20 MG tablet Take 20 mg by mouth daily.      . [DISCONTINUED] valsartan-hydrochlorothiazide (DIOVAN-HCT) 160-12.5 MG per tablet Take 1 tablet by mouth daily.      . [DISCONTINUED] Venlafaxine HCl 225 MG TB24 Take by mouth daily.       No current facility-administered medications for this visit.    Allergies:   Codeine; Lisinopril; Meperidine hcl; and Morphine   Past Medical History  Diagnosis Date  . ANXIETY DEPRESSION 04/14/2009  . Atrial fibrillation 04/14/2009  . GERD 09/07/2009  . HYPERLIPIDEMIA 09/07/2009  . HYPERTENSION 07/02/2009  . KNEE PAIN, RIGHT 12/07/2009  .  MIGRAINE UNSP W/O INTRACT W/O STATUS MIGRAINOSUS 07/02/2009  . Anemia   . Recurrent UTI     Past Surgical History  Procedure Laterality Date  . Appendectomy  1964  . Abdominal hysterectomy  1997  . Septorhinoplasty  1979  . Colonoscopy N/A 03/26/2014    Procedure: COLONOSCOPY;  Surgeon: Milus Banister, MD;  Location: [facility];  Service: Endoscopy;  Laterality: N/A;  . Esophagogastroduodenoscopy N/A 03/26/2014    Procedure: ESOPHAGOGASTRODUODENOSCOPY (EGD);  Surgeon: Milus Banister, MD;  Location: [facility];  Service: Endoscopy;  Laterality: N/A;     Social History:  The patient  reports that she has never smoked. She has never used smokeless tobacco. She reports that she drinks alcohol. She reports that she does not use illicit drugs.   Family History:  The patient's family history includes Breast cancer in her mother; Hypertension in her father,  mother, paternal grandfather, and paternal grandmother; Lung cancer in her father.    ROS:  Please see the history of present illness. All other systems are reviewed and  Negative to the above problem except as noted.    PHYSICAL EXAM: VS:  Ht 5\' 8"  (1.727 m)  Wt 207 lb 1.9 oz (93.949 kg)  BMI 31.50 kg/m2  GEN: Well nourished, well developed, in no acute distress HEENT: normal Neck: no JVD, carotid bruits, or masses Cardiac: RRR; no murmurs, rubs, or gallops,no edema  Respiratory:  clear to auscultation bilaterally, normal work of breathing GI: soft, nontender, nondistended, + BS  No hepatomegaly  MS: no deformity Moving all extremities   Skin: warm and dry, no rash Neuro:  Strength and sensation are intact Psych: euthymic mood, full affect   EKG:  EKG is not ordered today.   Lipid Panel    Component Value Date/Time   CHOL 235* 09/16/2014 0919   TRIG 185* 09/16/2014 0919   HDL 41* 09/16/2014 0919   CHOLHDL 5.7 09/16/2014 0919   VLDL 37 09/16/2014 0919   LDLCALC 157* 09/16/2014 0919   LDLDIRECT 118.9 12/07/2009 0953      Wt Readings from Last 3 Encounters:  12/11/14 207 lb 1.9 oz (93.949 kg)  09/09/14 209 lb (94.802 kg)  08/03/14 207 lb 8 oz (94.121 kg)      ASSESSMENT AND PLAN:  1.  Tachycardia  Spells do not sound like atrial fib, excpet for period of slowing  Otherwise sound more like SVT  No rhythm strips for review when she is in tachycardia   Would follow  If she feels sluggish on dilt I would switch back to amlodipne. SHe has been stressed at work with some sluggishens  Would recomm echo to reconfirm LVEF    2  Dyslipidemia  LDL is 157  Needs to cut back on fats, watch diet, lose wt.  WIll set up for Ca score CT  3.  Fatigue  Set up for echo.  4.  HTN  Follow    WIll arrange for f/u in winter.     Signed, Dorris Carnes, MD  12/11/2014 2:40 PM    [facility] [place], [place],   [address] Phone: [phone]; Fax:  [phone]

## 2014-12-11 NOTE — Patient Instructions (Signed)
Your physician recommends that you continue on your current medications as directed. Please refer to the Current Medication list given to you today.  Your physician has requested that you have an echocardiogram. Echocardiography is a painless test that uses sound waves to create images of your heart. It provides your doctor with information about the size and shape of your heart and how well your heart's chambers and valves are working. This procedure takes approximately one hour. There are no restrictions for this procedure.  Your physician wants you to follow-up in: [place].  You will receive a reminder letter in the mail two months in advance. If you don't receive a letter, please call our office to schedule the follow-up appointment.  YOU MAY SCHEDULE A CARDIAC CT SCORE TEST TODAY AT CHECKOUT.  THERE IS AN OUT OF POCKET COST OF $150

## 2014-12-18 ENCOUNTER — Ambulatory Visit (HOSPITAL_COMMUNITY): Payer: BLUE CROSS/BLUE SHIELD | Attending: Cardiovascular Disease

## 2014-12-18 ENCOUNTER — Other Ambulatory Visit: Payer: Self-pay

## 2014-12-18 DIAGNOSIS — I340 Nonrheumatic mitral (valve) insufficiency: Secondary | ICD-10-CM | POA: Insufficient documentation

## 2014-12-18 DIAGNOSIS — R0602 Shortness of breath: Secondary | ICD-10-CM | POA: Diagnosis not present

## 2014-12-25 ENCOUNTER — Encounter: Payer: Self-pay | Admitting: Internal Medicine

## 2014-12-25 ENCOUNTER — Other Ambulatory Visit: Payer: BLUE CROSS/BLUE SHIELD

## 2014-12-25 ENCOUNTER — Ambulatory Visit (INDEPENDENT_AMBULATORY_CARE_PROVIDER_SITE_OTHER): Payer: BLUE CROSS/BLUE SHIELD | Admitting: Internal Medicine

## 2014-12-25 VITALS — BP 120/76 | HR 59 | Temp 98.4°F | Resp 16 | Ht 68.0 in | Wt 209.0 lb

## 2014-12-25 DIAGNOSIS — I10 Essential (primary) hypertension: Secondary | ICD-10-CM

## 2014-12-25 DIAGNOSIS — N6452 Nipple discharge: Secondary | ICD-10-CM | POA: Diagnosis not present

## 2014-12-25 DIAGNOSIS — F341 Dysthymic disorder: Secondary | ICD-10-CM | POA: Diagnosis not present

## 2014-12-25 DIAGNOSIS — G43B0 Ophthalmoplegic migraine, not intractable: Secondary | ICD-10-CM

## 2014-12-25 MED ORDER — DILTIAZEM HCL ER COATED BEADS 120 MG PO CP24: 120.0000 mg | ORAL_CAPSULE | Freq: Every day | ORAL | Status: DC

## 2014-12-25 NOTE — Patient Instructions (Signed)
We are going to check the prolactin level for the breast but likely it is some mild trauma from the mammogram and will resolve on its own. There are no signs of infection that would need medication.   Work on exercising at least 3 times per week as this is one of the best things you can do for the heart, lungs, and brain. It helps to boost the happy hormones and burn through stress.   The other thing to consider is to work on adding back some fun and enjoyable activities to your life to get you back to you.   Exercise to Stay Healthy Exercise helps you become and stay healthy. EXERCISE IDEAS AND TIPS Choose exercises that:  You enjoy.  Fit into your day. You do not need to exercise really hard to be healthy. You can do exercises at a slow or medium level and stay healthy. You can:  Stretch before and after working out.  Try yoga, Pilates, or tai chi.  Lift weights.  Walk fast, swim, jog, run, climb stairs, bicycle, dance, or rollerskate.  Take aerobic classes. Exercises that burn about 150 calories:  Running 1  miles in 15 minutes.  Playing volleyball for 45 to 60 minutes.  Washing and waxing a car for 45 to 60 minutes.  Playing touch football for 45 minutes.  Walking 1  miles in 35 minutes.  Pushing a stroller 1  miles in 30 minutes.  Playing basketball for 30 minutes.  Raking leaves for 30 minutes.  Bicycling 5 miles in 30 minutes.  Walking 2 miles in 30 minutes.  Dancing for 30 minutes.  Shoveling snow for 15 minutes.  Swimming laps for 20 minutes.  Walking up stairs for 15 minutes.  Bicycling 4 miles in 15 minutes.  Gardening for 30 to 45 minutes.  Jumping rope for 15 minutes.  Washing windows or floors for 45 to 60 minutes. Document Released: 07/01/2010 Document Revised: 08/21/2011 Document Reviewed: 07/01/2010 [facility] Patient Information 2015 [place], [place]. This information is not intended to replace advice given to you by your health care  provider. Make sure you discuss any questions you have with your health care provider.

## 2014-12-25 NOTE — Assessment & Plan Note (Signed)
No signs of infection or skin changes. Benign in appearance and scant on exam. Reassured that mammogram was normal and most likely etiology was mild trauma from the mammogram which should resolve on its own. Ruling out prolactinoma with prolactin level.

## 2014-12-25 NOTE — Assessment & Plan Note (Signed)
Has abortive therapy when needed. Less frequent in recent months. Also on propanolol for prophylaxis and this has done well for her. No changes today.

## 2014-12-25 NOTE — Progress Notes (Signed)
Pre visit review using our clinic review tool, if applicable. No additional management support is needed unless otherwise documented below in the visit note. 

## 2014-12-25 NOTE — Progress Notes (Signed)
   Subjective:    Patient ID: Katelyn [name], female    DOB: 1951/05/08, 64 y.o.   MRN: [id number]  HPI The patient is a 64 YO female coming in for right nipple discharge. Since her mammogram (which was normal) several weeks ago she has had some slight clear discharge. She is concerned about it since it has not stopped. No pain in the breast, no rash or skin changes. Discharge is clear and never colored. No fevers or chills. Family history of breast cancer in mom.   PMH, [facility], social history reviewed and updated.   Review of Systems  Constitutional: Negative for fever, activity change, appetite change, fatigue and unexpected weight change.  HENT: Negative.   Eyes: Negative.   Respiratory: Negative for cough, chest tightness, shortness of breath and wheezing.   Cardiovascular: Negative for chest pain, palpitations and leg swelling.  Gastrointestinal: Negative for abdominal pain and abdominal distention.  Genitourinary:       Right nipple discharge.   Musculoskeletal: Negative for myalgias, back pain, arthralgias and gait problem.  Skin: Negative.   Neurological: Negative for dizziness, tremors, seizures, syncope, weakness, light-headedness and headaches.  Psychiatric/Behavioral: Negative.       Objective:   Physical Exam  Constitutional: She is oriented to person, place, and time. She appears well-developed and well-nourished.  Overweight  HENT:  Head: Normocephalic and atraumatic.  Eyes: EOM are normal.  Neck: Normal range of motion.  Cardiovascular: Normal rate and regular rhythm.   Pulmonary/Chest: Effort normal. No respiratory distress. She has wheezes. She has no rales.  Slight expiratory wheeze, partially clears with forced cough.  Abdominal: Soft. Bowel sounds are normal. She exhibits no distension. There is no tenderness. There is no rebound.  Breast exam normal, no skin changes, scant clear discharge with milking.   Musculoskeletal: She exhibits no edema.  Neurological:  She is alert and oriented to person, place, and time. Coordination normal.  Skin: Skin is warm and dry.   Filed Vitals:   12/25/14 1023  BP: 120/76  Pulse: 59  Temp: 98.4 F (36.9 C)  TempSrc: Oral  Resp: 16  Height: 5\' 8"  (1.727 m)  Weight: 209 lb (94.802 kg)  SpO2: 96%      Assessment & Plan:

## 2014-12-25 NOTE — Assessment & Plan Note (Signed)
Doing well with prozac, talked to her about the fact that regular exercise would help more with her mood problems and she will work on that.

## 2014-12-25 NOTE — Assessment & Plan Note (Signed)
BP well controlled on the diltiazem and propanolol. No indication for change today, recent BMP reviewed.

## 2014-12-26 LAB — PROLACTIN: PROLACTIN: 8.6 ng/mL

## 2015-01-08 ENCOUNTER — Telehealth: Payer: Self-pay | Admitting: Internal Medicine

## 2015-01-08 NOTE — Telephone Encounter (Signed)
Received records from [facility] forwarded to Dr. Vertell Novak 01/08/15 fbg.

## 2015-01-11 ENCOUNTER — Encounter: Payer: Self-pay | Admitting: *Deleted

## 2015-01-26 ENCOUNTER — Ambulatory Visit (INDEPENDENT_AMBULATORY_CARE_PROVIDER_SITE_OTHER): Payer: BLUE CROSS/BLUE SHIELD | Admitting: *Deleted

## 2015-01-26 VITALS — BP 130/90 | HR 104 | Wt 205.0 lb

## 2015-01-26 DIAGNOSIS — R000 Tachycardia, unspecified: Secondary | ICD-10-CM

## 2015-01-26 NOTE — Progress Notes (Signed)
1.) Reason for visit: walk in    2.) Name of MD requesting visit: Dr Marlou Porch   3.)  H&P:  Patient walked in today stating that she thought she had just had an episode of what she thinks was A Fib at home but converted on the way to the office. She was noticeably upset and crying. Patient stated she could feel her heart beating rapidly in her throat and that her heart rate was around 150-170. Her last "episode" was in June and prior to that several years ago. Per patient she came over to try to catch on the monitor exactly what her heart was doing since no documented AFib. The episode lasted about 30-45 minutes. EKG done and reviewed by Dr Marlou Porch (DOD), sinus tachycardia at HR 104.  Patient did state that she did not want to be on blood thinners.Patient stated she was feeling better after spending time with her and she just wanted to schedule ov with Dr Harrington Challenger and go home.   4.)  Assessment and plan per MD: Discussed with Dr Marlou Porch and will have patient continue current medications and ok to use extra Diltiazem as needed. Get an appointment soon with Dr Harrington Challenger as requested. Did advise if happens again to call the office or go to ED if taking the extra Diltiazem did not help. Patient agreeable to plan

## 2015-01-27 ENCOUNTER — Encounter: Payer: Self-pay | Admitting: *Deleted

## 2015-02-03 ENCOUNTER — Telehealth: Payer: Self-pay | Admitting: Internal Medicine

## 2015-02-03 ENCOUNTER — Encounter: Payer: Self-pay | Admitting: *Deleted

## 2015-02-03 NOTE — Telephone Encounter (Signed)
Reviewed Katelyn [name]'s in-basket (cc'ed charts). Dr. Harrington Challenger had reviewed the patient's nurse visit/ walk-in note from 01/26/15. She wanted to make sure the patient had a follow up scheduled with her. I attempted to call the patient, but no answer/ no voice mail. A My Chart message will be sent to the patient to call for follow up as it appears to be difficult to reach her by phone. She has had testing done and we have not been able to reach her by phone for results.

## 2015-02-12 ENCOUNTER — Encounter: Payer: Self-pay | Admitting: Internal Medicine

## 2015-02-12 ENCOUNTER — Ambulatory Visit (INDEPENDENT_AMBULATORY_CARE_PROVIDER_SITE_OTHER): Payer: BLUE CROSS/BLUE SHIELD | Admitting: Internal Medicine

## 2015-02-12 VITALS — BP 112/70 | HR 58 | Ht 68.0 in | Wt 200.0 lb

## 2015-02-12 DIAGNOSIS — I10 Essential (primary) hypertension: Secondary | ICD-10-CM | POA: Diagnosis not present

## 2015-02-12 LAB — BASIC METABOLIC PANEL
BUN: 17 mg/dL (ref 6–23)
CALCIUM: 9.8 mg/dL (ref 8.4–10.5)
CO2: 28 mEq/L (ref 19–32)
CREATININE: 0.91 mg/dL (ref 0.40–1.20)
Chloride: 104 mEq/L (ref 96–112)
GFR: 66.59 mL/min (ref >60.00)
GLUCOSE: 81 mg/dL (ref 70–99)
Potassium: 4.3 mEq/L (ref 3.5–5.1)
Sodium: 142 mEq/L (ref 135–145)

## 2015-02-12 NOTE — Patient Instructions (Signed)
Your physician recommends that you continue on your current medications as directed. Please refer to the Current Medication list given to you today. Your physician recommends that you return for lab work in: [place] (BM[place])  Your physician wants you to follow-up in: MARCH, 2017 [place].  You will receive a reminder letter in the mail two months in advance. If you don't receive a letter, please call our office to schedule the follow-up appointment.

## 2015-02-12 NOTE — Progress Notes (Signed)
Cardiology Office Note   Date:  02/12/2015   ID:  Katelyn [name], Katelyn [name] 1951/03/24, MRN [id number]  PCP:  Olga Millers, MD  Cardiologist:   Dorris Carnes, MD   No chief complaint on file.     History of Present Illness: Katelyn [name] is a 64 y.o. female with a history of   No chief complaint on file.  Pt referred for evaluation of tachycardia   History of Present Illness: Katelyn [name] is a 64 y.o. female with a history of intermitt atrial fib And HTN She was seen in the ER on 6/5 Last spell prior to that was about 5 yars ago.  Converted piro to arriq Also a history of GI bleed in past.   I saw her in July  She had an episode of tachycardia that converted prior to ER arrival   Echo done LVEF normal MOd diastolic dysfunciton. Mild LAD \\  Since seen has had another spell  Lasted about 30 min  Was dizzy  As HR slowed felt less dizzy  Fetl heart heart When not having spells is very active, no problem     Current Outpatient Prescriptions  Medication Sig Dispense Refill  . aspirin 325 MG EC tablet Take 325 mg by mouth daily.    . cetirizine (ZYRTEC) 10 MG tablet Take 1 tablet (10 mg total) by mouth daily. (Patient taking differently: Take 10 mg by mouth as needed for allergies. ) 30 tablet 5  . diltiazem (CARDIZEM CD) 120 MG 24 hr capsule Take 1 capsule (120 mg total) by mouth daily. 30 capsule 5  . Ferrous Sulfate (IRON) 325 (65 FE) MG TABS Take 325 mg by mouth 2 (two) times daily. (Patient taking differently: Take 325 mg by mouth daily. ) 60 each 3  . FLUoxetine (PROZAC) 20 MG capsule Take 2 capsules (40 mg total) by mouth daily. 60 capsule 5  . omeprazole (PRILOSEC OTC) 20 MG tablet Take 20 mg by mouth as needed (for heartburn).     . propranolol (INDERAL) 80 MG tablet Take 1 tablet (80 mg total) by mouth 3 (three) times daily. 270 tablet 5  . sulfamethoxazole-trimethoprim (BACTRIM DS,SEPTRA DS) 800-160 MG per tablet Take 1 tablet by mouth 2 (two)  times daily. (Patient not taking: Reported on 12/25/2014) 60 tablet 5  . SUMAtriptan (IMITREX) 100 MG tablet Take 1 tablet (100 mg total) by mouth 2 (two) times daily as needed for migraine. 10 tablet 3  . [DISCONTINUED] atorvastatin (LIPITOR) 20 MG tablet Take 20 mg by mouth daily.      . [DISCONTINUED] benazepril (LOTENSIN) 20 MG tablet Take 20 mg by mouth daily.      . [DISCONTINUED] valsartan-hydrochlorothiazide (DIOVAN-HCT) 160-12.5 MG per tablet Take 1 tablet by mouth daily.      . [DISCONTINUED] Venlafaxine HCl 225 MG TB24 Take by mouth daily.       No current facility-administered medications for this visit.    Allergies:   Codeine; Lisinopril; Meperidine hcl; and Morphine   Past Medical History  Diagnosis Date  . ANXIETY DEPRESSION 04/14/2009  . Atrial fibrillation 04/14/2009  . GERD 09/07/2009  . HYPERLIPIDEMIA 09/07/2009  . HYPERTENSION 07/02/2009  . KNEE PAIN, RIGHT 12/07/2009  . MIGRAINE UNSP W/O INTRACT W/O STATUS MIGRAINOSUS 07/02/2009  . Anemia   . Recurrent UTI     Past Surgical History  Procedure Laterality Date  . Appendectomy  1964  . Abdominal hysterectomy  1997  . Septorhinoplasty  1979  .  Colonoscopy N/A 03/26/2014    Procedure: COLONOSCOPY;  Surgeon: Milus Banister, MD;  Location: [facility];  Service: Endoscopy;  Laterality: N/A;  . Esophagogastroduodenoscopy N/A 03/26/2014    Procedure: ESOPHAGOGASTRODUODENOSCOPY (EGD);  Surgeon: Milus Banister, MD;  Location: [facility];  Service: Endoscopy;  Laterality: N/A;     Social History:  The patient  reports that she has never smoked. She has never used smokeless tobacco. She reports that she drinks alcohol. She reports that she does not use illicit drugs.   Family History:  The patient's family history includes Breast cancer in her mother; Hypertension in her father, mother, paternal grandfather, and paternal grandmother; Lung cancer in her father.    ROS:  Please see the history of present illness. All  other systems are reviewed and  Negative to the above problem except as noted.    PHYSICAL EXAM: VS:  BP 112/70 mmHg  Pulse 58  Ht 5\' 8"  (1.727 m)  Wt 200 lb (90.719 kg)  BMI 30.42 kg/m2  SpO2 95%  GEN: Well nourished, well developed, in no acute distress HEENT: normal Neck: no JVD, carotid bruits, or masses Cardiac: RRR; no murmurs, rubs, or gallops,no edema  Respiratory:  clear to auscultation bilaterally, normal work of breathing GI: soft, nontender, nondistended, + BS  No hepatomegaly  MS: no deformity Moving all extremities   Skin: warm and dry, no rash Neuro:  Strength and sensation are intact Psych: euthymic mood, full affect   EKG:  EKG is not  ordered today.   Lipid Panel    Component Value Date/Time   CHOL 235* 09/16/2014 0919   TRIG 185* 09/16/2014 0919   HDL 41* 09/16/2014 0919   CHOLHDL 5.7 09/16/2014 0919   VLDL 37 09/16/2014 0919   LDLCALC 157* 09/16/2014 0919   LDLDIRECT 118.9 12/07/2009 0953      Wt Readings from Last 3 Encounters:  02/12/15 200 lb (90.719 kg)  01/26/15 205 lb (92.987 kg)  12/25/14 209 lb (94.802 kg)      ASSESSMENT AND PLAN: 1  Palpitations  One recurrence  No syncope Need to dieagonse  If has recurrence can take 1/2 inderal Would set up for monitor then  Consider calling EMS if signif dizzy.  F/U in March      Signed, Nianna Igo, MD  02/12/2015 10:39 AM    [address], [place], [place]  [address] Phone: [phone]; Fax: [phone]

## 2015-02-22 ENCOUNTER — Other Ambulatory Visit: Payer: Self-pay | Admitting: Obstetrics and Gynecology

## 2015-02-22 ENCOUNTER — Encounter: Payer: Self-pay | Admitting: Obstetrics and Gynecology

## 2015-02-22 ENCOUNTER — Ambulatory Visit (INDEPENDENT_AMBULATORY_CARE_PROVIDER_SITE_OTHER): Payer: BLUE CROSS/BLUE SHIELD | Admitting: Obstetrics and Gynecology

## 2015-02-22 VITALS — BP 138/90 | HR 88 | Resp 16 | Ht 65.5 in | Wt 199.0 lb

## 2015-02-22 DIAGNOSIS — Z01419 Encounter for gynecological examination (general) (routine) without abnormal findings: Secondary | ICD-10-CM

## 2015-02-22 DIAGNOSIS — N6310 Unspecified lump in the right breast, unspecified quadrant: Secondary | ICD-10-CM

## 2015-02-22 DIAGNOSIS — N6452 Nipple discharge: Secondary | ICD-10-CM

## 2015-02-22 DIAGNOSIS — N6315 Unspecified lump in the right breast, overlapping quadrants: Secondary | ICD-10-CM

## 2015-02-22 DIAGNOSIS — N907 Vulvar cyst: Secondary | ICD-10-CM

## 2015-02-22 DIAGNOSIS — N63 Unspecified lump in breast: Secondary | ICD-10-CM | POA: Diagnosis not present

## 2015-02-22 NOTE — Progress Notes (Signed)
Patient ID: Katelyn [name], female   DOB: 04-Nov-1950, 64 y.o.   MRN: [id number] 64 y.o. G1P1001 DivorcedCaucasianF here for annual exam.  Pt is c/o right breast discharge x 3 months. She also recently found a lump, a few weeks ago. Sometimes when she presses on it some d/c comes out. Not tender. She had a negative mammogram in 5/15 and a normal prolactin level in 7/16. She c/o a clear breast d/c, getting worse. H/O a TAH/BSO. No currently sexually active. She also c/o a non-tender lump on her vulva and in the perianal area.     No LMP recorded. Patient has had a hysterectomy.          Sexually active: No.  The current method of family planning is status post hysterectomy.    Exercising: Yes.    walking Smoker:  no  Health Maintenance: Pap:  2012 WNL per patient History of abnormal Pap:  no MMG:  10-26-14 WNL Colonoscopy:  03-26-14 WNL BMD:   20 yrs ago- normal per patient TDaP:  2012  Gardasil: N/A   reports that she has never smoked. She has never used smokeless tobacco. She reports that she drinks alcohol. She reports that she does not use illicit drugs. She recently quit her job, lost 10 lbs. She is walking. 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 Has a 50 year old son, in school. No grandchildren.  Past Medical History  Diagnosis Date  . ANXIETY DEPRESSION 04/14/2009  . Atrial fibrillation 04/14/2009  . GERD 09/07/2009  . HYPERLIPIDEMIA 09/07/2009  . HYPERTENSION 07/02/2009  . KNEE PAIN, RIGHT 12/07/2009  . MIGRAINE UNSP W/O INTRACT W/O STATUS MIGRAINOSUS 07/02/2009  . Anemia   . Recurrent UTI     Past Surgical History  Procedure Laterality Date  . Appendectomy  1964  . Abdominal hysterectomy  1997  . Septorhinoplasty  1979  . Colonoscopy N/A 03/26/2014    Procedure: COLONOSCOPY;  Surgeon: Milus Banister, MD;  Location: [facility];  Service: Endoscopy;  Laterality: N/A;  . Esophagogastroduodenoscopy N/A 03/26/2014    Procedure: ESOPHAGOGASTRODUODENOSCOPY (EGD);   Surgeon: Milus Banister, MD;  Location: [facility];  Service: Endoscopy;  Laterality: N/A;    Current Outpatient Prescriptions  Medication Sig Dispense Refill  . aspirin 325 MG EC tablet Take 325 mg by mouth daily.    . cetirizine (ZYRTEC) 10 MG tablet Take 1 tablet (10 mg total) by mouth daily. (Patient taking differently: Take 10 mg by mouth as needed for allergies. ) 30 tablet 5  . diltiazem (CARDIZEM CD) 120 MG 24 hr capsule Take 1 capsule (120 mg total) by mouth daily. 30 capsule 5  . Ferrous Sulfate (IRON) 325 (65 FE) MG TABS Take 325 mg by mouth 2 (two) times daily. (Patient taking differently: Take 325 mg by mouth daily. ) 60 each 3  . FLUoxetine (PROZAC) 20 MG capsule Take 2 capsules (40 mg total) by mouth daily. 60 capsule 5  . omeprazole (PRILOSEC OTC) 20 MG tablet Take 20 mg by mouth as needed (for heartburn).     . propranolol (INDERAL) 80 MG tablet Take 1 tablet (80 mg total) by mouth 3 (three) times daily. 270 tablet 5  . SUMAtriptan (IMITREX) 100 MG tablet Take 1 tablet (100 mg total) by mouth 2 (two) times daily as needed for migraine. 10 tablet 3  . sulfamethoxazole-trimethoprim (BACTRIM DS,SEPTRA DS) 800-160 MG per tablet Take 1 tablet by mouth 2 (two) times daily. (Patient not taking:  Reported on 12/25/2014) 60 tablet 5  . [DISCONTINUED] atorvastatin (LIPITOR) 20 MG tablet Take 20 mg by mouth daily.      . [DISCONTINUED] benazepril (LOTENSIN) 20 MG tablet Take 20 mg by mouth daily.      . [DISCONTINUED] valsartan-hydrochlorothiazide (DIOVAN-HCT) 160-12.5 MG per tablet Take 1 tablet by mouth daily.      . [DISCONTINUED] Venlafaxine HCl 225 MG TB24 Take by mouth daily.       No current facility-administered medications for this visit.    Family History  Problem Relation Age of Onset  . Hypertension Mother   . Breast cancer Mother   . Hypertension Father   . Lung cancer Father     textiles and smoker  . Hypertension Paternal Grandmother   . Hypertension Paternal  Grandfather     Review of Systems  Genitourinary:       Breast discharge Breast lump -right  All other systems reviewed and are negative.   Exam:   BP 138/90 mmHg  Pulse 88  Resp 16  Ht 5' 5.5" (1.664 m)  Wt 199 lb (90.266 kg)  BMI 32.60 kg/m2  Weight change: @WEIGHTCHANGE @ Height:   Height: 5' 5.5" (166.4 cm)  Ht Readings from Last 3 Encounters:  02/22/15 5' 5.5" (1.664 m)  02/12/15 5\' 8"  (1.727 m)  12/25/14 5\' 8"  (1.727 m)    General appearance: alert, cooperative and appears stated age Head: Normocephalic, without obvious abnormality, atraumatic Neck: no adenopathy, supple, symmetrical, trachea midline and thyroid normal to inspection and palpation Lungs: clear to auscultation bilaterally Breasts:Right breast with a pea sized lump at 3 o'clock in the areolar region, right nipple with a clear d/c, d/c increases with pushing on the lump. No left breast lump. No skin dimpling. Nipple d/c is clear and guiac negative.  Heart: regular rate and rhythm Abdomen: soft, non-tender; bowel sounds normal; no masses,  no organomegaly Extremities: extremities normal, atraumatic, no cyanosis or edema Skin: Skin color, texture, turgor normal. No rashes or lesions Lymph nodes: Cervical, supraclavicular, and axillary nodes normal. No abnormal inguinal nodes palpated Neurologic: Grossly normal   Pelvic: External genitalia:  Vulvar cyst on the lower right vulva, 6-7 mm, raised, not tender, no erythema              Urethra:  normal appearing urethra with no masses, tenderness or lesions              Bartholins and Skenes: normal                 Vagina: normal appearing vagina with normal color and discharge, no lesions, cuff well healed, mild atrophy.              Cervix: absent               Bimanual Exam:  Uterus:  uterus absent              Adnexa: no mass, fullness, tenderness               Rectovaginal: Confirms               Anus:  normal sphincter tone, cyst noted posterior to the  anus, 5-6 mm, raised, not tender  Chaperone was present for exam.  A:  Annual exam  Right breast lump and right nipple d/c, normal mammogram in 5/16, normal prolactin level in 7/16  Vulvar cyst, perianal cyst, bothersome to the patient, she desires removal  P:   Right nipple  d/c, guiac negative  Send for diagnostic right breast imaging  No pap needed  Colonoscopy UTD  Return for removal of the vulvar and perianal cysts

## 2015-02-22 NOTE — Progress Notes (Signed)
Scheduled patient while in office for right breast diagnostic imaging with right breast ultrasound for 02/24/2015 at 9:20 am at [facility]. Patient is agreeable. Appointment scheduled for 6 week breast recheck and cyst removal on 04/01/2015 at 1 pm with Dr.Jertson. Agreeable to date and time.

## 2015-02-22 NOTE — Patient Instructions (Signed)

## 2015-02-24 ENCOUNTER — Ambulatory Visit
Admission: RE | Admit: 2015-02-24 | Discharge: 2015-02-24 | Disposition: A | Discharge status: Home / Self Care | Payer: BLUE CROSS/BLUE SHIELD | Source: Ambulatory Visit | Attending: Obstetrics and Gynecology | Admitting: Obstetrics and Gynecology

## 2015-02-24 ENCOUNTER — Other Ambulatory Visit: Payer: Self-pay | Admitting: Obstetrics and Gynecology

## 2015-02-24 DIAGNOSIS — N6310 Unspecified lump in the right breast, unspecified quadrant: Principal | ICD-10-CM

## 2015-02-24 DIAGNOSIS — N6315 Unspecified lump in the right breast, overlapping quadrants: Secondary | ICD-10-CM

## 2015-02-24 DIAGNOSIS — N6452 Nipple discharge: Secondary | ICD-10-CM

## 2015-02-25 ENCOUNTER — Other Ambulatory Visit: Payer: Self-pay | Admitting: Obstetrics and Gynecology

## 2015-02-25 ENCOUNTER — Ambulatory Visit
Admission: RE | Admit: 2015-02-25 | Discharge: 2015-02-25 | Disposition: A | Discharge status: Home / Self Care | Payer: BLUE CROSS/BLUE SHIELD | Source: Ambulatory Visit | Attending: Obstetrics and Gynecology | Admitting: Obstetrics and Gynecology

## 2015-02-25 DIAGNOSIS — N6310 Unspecified lump in the right breast, unspecified quadrant: Principal | ICD-10-CM

## 2015-02-25 DIAGNOSIS — N6452 Nipple discharge: Secondary | ICD-10-CM

## 2015-02-25 DIAGNOSIS — N6315 Unspecified lump in the right breast, overlapping quadrants: Secondary | ICD-10-CM

## 2015-02-25 NOTE — Addendum Note (Signed)
Addended by: Michele Mcalpine on: 02/25/2015 11:12 AM   Modules accepted: Orders

## 2015-02-26 ENCOUNTER — Telehealth: Payer: Self-pay | Admitting: Obstetrics and Gynecology

## 2015-02-26 NOTE — Telephone Encounter (Signed)
Spoke with patient regarding benefits for procedure scheduled 04/07/15. Patient understands and agreeable to benefit. Verified 72 hour cancellation policy and $356 fee. Patient understood and agreeable. Verified appt date/time. Ok to close.

## 2015-03-04 ENCOUNTER — Other Ambulatory Visit: Payer: Self-pay | Admitting: Surgery

## 2015-03-04 DIAGNOSIS — D241 Benign neoplasm of right breast: Secondary | ICD-10-CM

## 2015-03-11 ENCOUNTER — Ambulatory Visit
Admission: RE | Admit: 2015-03-11 | Discharge: 2015-03-11 | Disposition: A | Discharge status: Home / Self Care | Payer: BLUE CROSS/BLUE SHIELD | Source: Ambulatory Visit | Attending: Surgery | Admitting: Surgery

## 2015-03-11 MED ORDER — GADOBENATE DIMEGLUMINE 529 MG/ML IV SOLN: 18.0000 mL | Freq: Once | INTRAVENOUS | Status: DC | PRN

## 2015-03-17 ENCOUNTER — Other Ambulatory Visit: Payer: Self-pay | Admitting: Surgery

## 2015-03-17 DIAGNOSIS — D241 Benign neoplasm of right breast: Secondary | ICD-10-CM

## 2015-03-23 ENCOUNTER — Other Ambulatory Visit: Payer: Self-pay | Admitting: Surgery

## 2015-03-23 DIAGNOSIS — D241 Benign neoplasm of right breast: Secondary | ICD-10-CM

## 2015-04-05 ENCOUNTER — Encounter (HOSPITAL_BASED_OUTPATIENT_CLINIC_OR_DEPARTMENT_OTHER): Payer: Self-pay | Admitting: *Deleted

## 2015-04-07 ENCOUNTER — Ambulatory Visit (INDEPENDENT_AMBULATORY_CARE_PROVIDER_SITE_OTHER): Payer: BLUE CROSS/BLUE SHIELD | Admitting: Obstetrics and Gynecology

## 2015-04-07 ENCOUNTER — Encounter (HOSPITAL_BASED_OUTPATIENT_CLINIC_OR_DEPARTMENT_OTHER): Payer: BLUE CROSS/BLUE SHIELD

## 2015-04-07 ENCOUNTER — Encounter: Payer: Self-pay | Admitting: Obstetrics and Gynecology

## 2015-04-07 VITALS — BP 106/70 | HR 56 | Resp 16 | Ht 67.0 in | Wt 191.0 lb

## 2015-04-07 DIAGNOSIS — F419 Anxiety disorder, unspecified: Secondary | ICD-10-CM | POA: Diagnosis not present

## 2015-04-07 DIAGNOSIS — K6289 Other specified diseases of anus and rectum: Secondary | ICD-10-CM | POA: Diagnosis not present

## 2015-04-07 DIAGNOSIS — N907 Vulvar cyst: Secondary | ICD-10-CM | POA: Diagnosis not present

## 2015-04-07 DIAGNOSIS — K219 Gastro-esophageal reflux disease without esophagitis: Secondary | ICD-10-CM | POA: Diagnosis not present

## 2015-04-07 DIAGNOSIS — Z79899 Other long term (current) drug therapy: Secondary | ICD-10-CM | POA: Diagnosis not present

## 2015-04-07 DIAGNOSIS — I10 Essential (primary) hypertension: Secondary | ICD-10-CM | POA: Diagnosis not present

## 2015-04-07 DIAGNOSIS — D241 Benign neoplasm of right breast: Secondary | ICD-10-CM | POA: Diagnosis present

## 2015-04-07 DIAGNOSIS — E785 Hyperlipidemia, unspecified: Secondary | ICD-10-CM | POA: Diagnosis not present

## 2015-04-07 DIAGNOSIS — F329 Major depressive disorder, single episode, unspecified: Secondary | ICD-10-CM | POA: Diagnosis not present

## 2015-04-07 LAB — BASIC METABOLIC PANEL
ANION GAP: 11 (ref 5–15)
BUN: 13 mg/dL (ref 6–20)
CALCIUM: 9.5 mg/dL (ref 8.9–10.3)
CO2: 26 mmol/L (ref 22–32)
Chloride: 102 mmol/L (ref 101–111)
Creatinine, Ser: 0.79 mg/dL (ref 0.44–1.00)
GFR calc Af Amer: >60 mL/min (ref >60)
GLUCOSE: 98 mg/dL (ref 65–99)
POTASSIUM: 4.2 mmol/L (ref 3.5–5.1)
SODIUM: 139 mmol/L (ref 135–145)

## 2015-04-07 NOTE — Progress Notes (Signed)
GYNECOLOGY  VISIT   HPI: 64 y.o.   Divorced  Caucasian  female   G1P1001 with No LMP recorded. Patient has had a hysterectomy.   here for Breast Check and vulvar and perianal Cyst removal. She was noted to have a breast lump and nipple d/c at her last visit. She was diagnosed with an intraductal papilloma with ductal hyperplasia and is scheduled for a lumpectomy in 2 days.  GYNECOLOGIC HISTORY: No LMP recorded. Patient has had a hysterectomy. Contraception: Hysterctomy  Menopausal hormone therapy: None        OB History    Gravida Para Term Preterm AB TAB SAB Ectopic Multiple Living   1 1 1       1          Patient Active Problem List   Diagnosis Date Noted  . Nipple discharge 12/25/2014  . Hiatal hernia 03/26/2014  . Diverticulosis of colon without hemorrhage 03/26/2014  . Iron deficiency anemia 03/26/2014  . Wheezing 08/06/2013  . Essential hypertension, benign 08/06/2013  . Migraine 08/06/2013  . ANXIETY DEPRESSION 04/14/2009  . Atrial fibrillation ([place]) 04/14/2009    Past Medical History  Diagnosis Date  . ANXIETY DEPRESSION 04/14/2009  . Atrial fibrillation ([place]) 04/14/2009  . GERD 09/07/2009  . HYPERLIPIDEMIA 09/07/2009  . HYPERTENSION 07/02/2009  . KNEE PAIN, RIGHT 12/07/2009  . MIGRAINE UNSP W/O INTRACT W/O STATUS MIGRAINOSUS 07/02/2009  . Anemia   . Recurrent UTI   . Anxiety   . Papilloma of right breast   . PONV (postoperative nausea and vomiting)     Past Surgical History  Procedure Laterality Date  . Appendectomy  1964  . Abdominal hysterectomy  1997  . Septorhinoplasty  1979  . Colonoscopy N/A 03/26/2014    Procedure: COLONOSCOPY;  Surgeon: Milus Banister, MD;  Location: [facility];  Service: Endoscopy;  Laterality: N/A;  . Esophagogastroduodenoscopy N/A 03/26/2014    Procedure: ESOPHAGOGASTRODUODENOSCOPY (EGD);  Surgeon: Milus Banister, MD;  Location: [facility];  Service: Endoscopy;  Laterality: N/A;    Current Outpatient Prescriptions   Medication Sig Dispense Refill  . cetirizine (ZYRTEC) 10 MG tablet Take 1 tablet (10 mg total) by mouth daily. (Patient taking differently: Take 10 mg by mouth as needed for allergies. ) 30 tablet 5  . diltiazem (CARDIZEM CD) 120 MG 24 hr capsule Take 1 capsule (120 mg total) by mouth daily. 30 capsule 5  . Ferrous Sulfate (IRON) 325 (65 FE) MG TABS Take 325 mg by mouth 2 (two) times daily. (Patient taking differently: Take 325 mg by mouth daily. ) 60 each 3  . FLUoxetine (PROZAC) 20 MG capsule Take 2 capsules (40 mg total) by mouth daily. 60 capsule 5  . omeprazole (PRILOSEC OTC) 20 MG tablet Take 20 mg by mouth as needed (for heartburn).     . propranolol (INDERAL) 80 MG tablet Take 1 tablet (80 mg total) by mouth 3 (three) times daily. (Patient taking differently: Take 80 mg by mouth 2 (two) times daily. ) 270 tablet 5  . SUMAtriptan (IMITREX) 100 MG tablet Take 1 tablet (100 mg total) by mouth 2 (two) times daily as needed for migraine. 10 tablet 3  . aspirin 325 MG EC tablet Take 325 mg by mouth daily.    . [DISCONTINUED] atorvastatin (LIPITOR) 20 MG tablet Take 20 mg by mouth daily.      . [DISCONTINUED] benazepril (LOTENSIN) 20 MG tablet Take 20 mg by mouth daily.      . [DISCONTINUED] valsartan-hydrochlorothiazide (DIOVAN-HCT)  160-12.5 MG per tablet Take 1 tablet by mouth daily.      . [DISCONTINUED] Venlafaxine HCl 225 MG TB24 Take by mouth daily.       No current facility-administered medications for this visit.     ALLERGIES: Codeine; Lisinopril; and Morphine  Family History  Problem Relation Age of Onset  . Hypertension Mother   . Breast cancer Mother   . Hypertension Father   . Lung cancer Father     textiles and smoker  . Hypertension Paternal Grandmother   . Hypertension Paternal Grandfather     Social History   Social History  . Marital Status: Divorced    Spouse Name: N/A  . Number of Children: 1  . Years of Education: N/A   Occupational History  . RN     Social History Main Topics  . Smoking status: Never Smoker   . Smokeless tobacco: Never Used  . Alcohol Use: 0.0 oz/week    0 Standard drinks or equivalent per week     Comment: social occasional  . Drug Use: No  . Sexual Activity: No   Other Topics Concern  . Not on file   Social History Narrative   Previously worked at [employer], has been stressed by 12 years of separation with husband who recently stopped paying alimony.  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      Review of Systems  Constitutional:       Breast Lumps  Skin:       Changed mole  All other systems reviewed and are negative.   PHYSICAL EXAMINATION:    BP 106/70 mmHg  Pulse 56  Resp 16  Ht 5\' 7"  (1.702 m)  Wt 191 lb (86.637 kg)  BMI 29.91 kg/m2    General appearance: alert, cooperative and appears stated age Skin: patient questions a lesion on her lateral chest wall, c/w a seborrhea keratosis. Breast exam deferred, she is having a lumpectomy this week   Pelvic: External genitalia:  6-7 mm raised skin colored cystic lesion on the right vulva right under the labia majora. Perianal exam: 6-7 mm raised skin colored cystic lesion in the perianal region at 7 o'clock               The risks of the procedure were reviewed with the patient and a consent was signed. The areas were cleansed with betadine and injected with 1% lidocaine. A #11 blade was used to remove an elliptical piece of tissue encompassing the cyst in both locations. The defects were each closed with 2 stiches of 3-0 vicryl. The patient tolerated the procedure well.     Chaperone was present for exam.  ASSESSMENT Vulvar and perianal cyst, bothersome to the patient    PLAN Both areas removed   An After Visit Summary was printed and given to the patient.

## 2015-04-08 ENCOUNTER — Ambulatory Visit
Admission: RE | Admit: 2015-04-08 | Discharge: 2015-04-08 | Disposition: A | Discharge status: Home / Self Care | Payer: BLUE CROSS/BLUE SHIELD | Source: Ambulatory Visit | Attending: Surgery | Admitting: Surgery

## 2015-04-08 DIAGNOSIS — D241 Benign neoplasm of right breast: Secondary | ICD-10-CM

## 2015-04-08 NOTE — H&P (Signed)
Katelyn [name] Goes by "Katelyn [name]"  Location: [facility] Patient #: [id number] DOB: [birth year] Single / Language: Undefined / Race: Undefined Female  History of Present Illness   Patient words: right breast papiloma.   The patient is a 64 year old female who presents with a complaint of breast abnormality.   Here PCP is Dr. Ammie Ferrier.  She comes by herself.   She had a negative mammogram in May, 2016. then she noticed a right breast nipple discharge. She had a workup by Dr. Doug Sou which was negative. She was sent to Dr. Sumner Boast, who had her obtain further mammographic studies.  She had a right breast biopsy - 02/25/2015 - [id number] - that showed an intraductal papilloma (3 o'clock) and fibrocystic changes (8 o'clock). Nothinig else needs to be done to the 8 o'clock lesion.   She is not on hormone tx. Her mother died of breast cancer.  Plan: I spoke with Dr. Ammie Ferrier - the lesion at 8 o'clock is concordant and needs no further biopsy, she would recommend a breast MRI, then seed localization of the papilloma.  Past Medical History: 1. HTN x 3 years 2. SVT - sees Dr. Lizbeth Bark last "attack" 2 months ago - she said that she feels a pulse in her throat She last saw Dr. Harrington Challenger - 02/12/2015 3. Anxiety/depression 4. 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 Transfusion history  Past Surgical History Mammie Lorenzo, LPN; 12/25/9676 9:38 AM) Appendectomy Breast Biopsy Right. Foot Surgery Bilateral. Hysterectomy (not due to cancer) - Complete  Diagnostic Studies History Mammie Lorenzo, LPN; 06/12/7508 2:58 AM) Colonoscopy within last year Mammogram within last year Pap  Smear 1-5 years ago  Allergies Mammie Lorenzo, LPN; 11/06/7822 2:35 AM) Codeine Phosphate *ANALGESICS - OPIOID* Nausea, Vomiting. Lisinopril *ANTIHYPERTENSIVES* Cough. Meperidine HCl *ANALGESICS - OPIOID* Hives. Morphine Sulfate (Concentrate) *ANALGESICS - OPIOID* Hives, Itching, Nausea, Vomiting.  Medication History Mammie Lorenzo, LPN; 3/61/4431 5:40 AM) FLUoxetine HCl (20MG  Capsule, Oral) Active. Sulfamethoxazole-Trimethoprim (800-160MG  Tablet, Oral) Active. SUMAtriptan Succinate (100MG  Tablet, Oral) Active. ZyrTEC Allergy (10MG  Tablet, Oral) Active. Diltiazem CD (120MG  Capsule ER 24HR, Oral) Active. Iron (Ferrous Gluconate) (325MG  Tablet, Oral) Active. PriLOSEC (20MG  Capsule DR, Oral) Active. Inderal (80MG  Tablet, Oral) Active. Medications Reconciled  Social History Mammie Lorenzo, LPN; 0/86/7619 5:09 AM) Alcohol use Occasional alcohol use. Caffeine use Tea. No drug use Tobacco use Never smoker.  Family History Mammie Lorenzo, LPN; 09/05/7122 5:80 AM) Breast Cancer Mother. Cancer Father.  Pregnancy / Birth History Mammie Lorenzo, LPN; 9/98/3382 5:05 AM) Age at menarche 73 years. Age of menopause <47 Contraceptive History Oral contraceptives. Gravida 1 Maternal age 12-30 Para 1    Review of Systems Mammie Lorenzo LPN; 3/97/6734 1:93 AM) General Not Present- Appetite Loss, Chills, Fatigue, Fever, Night Sweats, Weight Gain and Weight Loss. Skin Not Present- Change in Wart/Mole, Dryness, Hives, Jaundice, New Lesions, Non-Healing Wounds, Rash and Ulcer. HEENT Not Present- Earache, Hearing Loss, Hoarseness, Nose Bleed, Oral Ulcers, Ringing in the Ears, Seasonal Allergies, Sinus Pain, Sore Throat, Visual Disturbances, Wears glasses/contact lenses and Yellow Eyes. Breast Present- Breast Mass and Nipple Discharge. Not Present- Breast Pain and Skin Changes. Cardiovascular Not Present- Chest Pain, Difficulty Breathing Lying Down, Leg Cramps, Palpitations,  Rapid Heart Rate, Shortness of Breath and  Swelling of Extremities. Gastrointestinal Not Present- Abdominal Pain, Bloating, Bloody Stool, Change in Bowel Habits, Chronic diarrhea, Constipation, Difficulty Swallowing, Excessive gas, Gets full quickly at meals, Hemorrhoids, Indigestion, Nausea, Rectal Pain and Vomiting. Female Genitourinary Not Present- Frequency, Nocturia, Painful Urination, Pelvic Pain and Urgency. Musculoskeletal Not Present- Back Pain, Joint Pain, Joint Stiffness, Muscle Pain, Muscle Weakness and Swelling of Extremities. Neurological Not Present- Decreased Memory, Fainting, Headaches, Numbness, Seizures, Tingling, Tremor, Trouble walking and Weakness. Psychiatric Not Present- Anxiety, Bipolar, Change in Sleep Pattern, Depression, Fearful and Frequent crying. Endocrine Not Present- Cold Intolerance, Excessive Hunger, Hair Changes, Heat Intolerance, Hot flashes and New Diabetes. Hematology Not Present- Easy Bruising, Excessive bleeding, Gland problems, HIV and Persistent Infections.  Vitals Claiborne Billings Dockery LPN; 06/30/1476 2:95 AM) 03/04/2015 9:05 AM Weight: 196.6 lb Height: 67in Body Surface Area: 2.05 m Body Mass Index: 30.79 kg/m  Temp.: 98.74F(Oral)  Pulse: 49 (Regular)  BP: 120/76 (Sitting, Left Arm, Standard)   Physical Exam  General: WN older WF alert and generally healthy appearing. HEENT: Normal. Pupils equal.  Neck: Supple. No mass. No thyroid mass.  Lymph Nodes: No supraclavicular or cervical or axillary nodes.  Breast: Right - bruise at 3 o'clock. I cannot feel mass. She did not have nippel discharge, but I did not push hard because of recent biopsy. She said that they used the same puncture site for both biopsies. Left - no mass  Lungs: Clear to auscultation and symmetric breath sounds. Heart: RRR. No murmur or rub.  Extremities: Good strength and ROM in upper and lower extremities.  Neurologic: Grossly intact to motor and  sensory function. Psychiatric: Has normal mood and affect. Behavior is normal.  Assessment & Plan  1.  PAPILLOMA OF BREAST, RIGHT (D24.1)  Story: Right breast biopsy - 02/25/2015 - [id number] - that showed an intraductal papilloma (3 o'clock) and fibrocystic changes (8 o'clock)  Impression: Plan:   1) Breast MRI   2) Excision of right breast papilloma at 3 o'clock (seed loc)   Addendum Note(Askia Hazelip H. Kathern Lobosco MD; 03/17/2015 1:52 PM)  I reviewed the MRI with her - it shows only the area of the papilloma.  Plan right breast lumpectomy with seed loc  2. HTN x 3 years 3. SVT - sees Dr. Lizbeth Bark last "attack" 2 months ago - she said that she feels a pulse in her throat She last saw Dr. Harrington Challenger - 02/12/2015 4. Anxiety/depression 5. Hyperlipidemia  Alphonsa Overall, MD, [facility] Pager: [phone] Office phone:  [phone]

## 2015-04-09 ENCOUNTER — Ambulatory Visit (HOSPITAL_BASED_OUTPATIENT_CLINIC_OR_DEPARTMENT_OTHER): Payer: BLUE CROSS/BLUE SHIELD | Admitting: Anesthesiology

## 2015-04-09 ENCOUNTER — Ambulatory Visit
Admission: RE | Admit: 2015-04-09 | Discharge: 2015-04-09 | Disposition: A | Discharge status: Home / Self Care | Payer: BLUE CROSS/BLUE SHIELD | Source: Ambulatory Visit | Attending: Surgery | Admitting: Surgery

## 2015-04-09 ENCOUNTER — Encounter (HOSPITAL_BASED_OUTPATIENT_CLINIC_OR_DEPARTMENT_OTHER)
Admission: RE | Disposition: A | Discharge status: Home / Self Care | Payer: Self-pay | Source: Ambulatory Visit | Attending: Surgery

## 2015-04-09 ENCOUNTER — Ambulatory Visit (HOSPITAL_BASED_OUTPATIENT_CLINIC_OR_DEPARTMENT_OTHER)
Admission: RE | Admit: 2015-04-09 | Discharge: 2015-04-09 | Disposition: A | Discharge status: Home / Self Care | Payer: BLUE CROSS/BLUE SHIELD | Source: Ambulatory Visit | Attending: Surgery | Admitting: Surgery

## 2015-04-09 ENCOUNTER — Encounter (HOSPITAL_BASED_OUTPATIENT_CLINIC_OR_DEPARTMENT_OTHER): Payer: Self-pay | Admitting: *Deleted

## 2015-04-09 DIAGNOSIS — D241 Benign neoplasm of right breast: Secondary | ICD-10-CM | POA: Insufficient documentation

## 2015-04-09 DIAGNOSIS — F419 Anxiety disorder, unspecified: Secondary | ICD-10-CM | POA: Insufficient documentation

## 2015-04-09 DIAGNOSIS — K219 Gastro-esophageal reflux disease without esophagitis: Secondary | ICD-10-CM | POA: Insufficient documentation

## 2015-04-09 DIAGNOSIS — I10 Essential (primary) hypertension: Secondary | ICD-10-CM | POA: Insufficient documentation

## 2015-04-09 DIAGNOSIS — F329 Major depressive disorder, single episode, unspecified: Secondary | ICD-10-CM | POA: Insufficient documentation

## 2015-04-09 DIAGNOSIS — Z79899 Other long term (current) drug therapy: Secondary | ICD-10-CM | POA: Insufficient documentation

## 2015-04-09 DIAGNOSIS — E785 Hyperlipidemia, unspecified: Secondary | ICD-10-CM | POA: Insufficient documentation

## 2015-04-09 HISTORY — DX: Benign neoplasm of right breast: D24.1

## 2015-04-09 HISTORY — PX: BREAST LUMPECTOMY WITH RADIOACTIVE SEED LOCALIZATION: SHX6424

## 2015-04-09 HISTORY — DX: Other specified postprocedural states: Z98.890

## 2015-04-09 HISTORY — DX: Other specified postprocedural states: R11.2

## 2015-04-09 SURGERY — BREAST LUMPECTOMY WITH RADIOACTIVE SEED LOCALIZATION
Anesthesia: General | Site: Breast | Laterality: Right

## 2015-04-09 MED ORDER — BUPIVACAINE-EPINEPHRINE (PF) 0.5% -1:200000 IJ SOLN
INTRAMUSCULAR | Status: AC
  Filled 2015-04-09: qty 30

## 2015-04-09 MED ORDER — DEXAMETHASONE SODIUM PHOSPHATE 4 MG/ML IJ SOLN
INTRAMUSCULAR | Status: DC | PRN
  Administered 2015-04-09: 10 mg via INTRAVENOUS

## 2015-04-09 MED ORDER — OXYCODONE HCL 5 MG/5ML PO SOLN: 5.0000 mg | Freq: Once | ORAL | Status: DC | PRN

## 2015-04-09 MED ORDER — ONDANSETRON HCL 4 MG/2ML IJ SOLN
INTRAMUSCULAR | Status: DC | PRN
  Administered 2015-04-09: 4 mg via INTRAVENOUS

## 2015-04-09 MED ORDER — LIDOCAINE HCL (CARDIAC) 20 MG/ML IV SOLN
INTRAVENOUS | Status: AC
  Filled 2015-04-09: qty 5

## 2015-04-09 MED ORDER — OXYCODONE HCL 5 MG PO TABS: 5.0000 mg | ORAL_TABLET | Freq: Once | ORAL | Status: DC | PRN

## 2015-04-09 MED ORDER — ONDANSETRON HCL 4 MG/2ML IJ SOLN
INTRAMUSCULAR | Status: AC
  Filled 2015-04-09: qty 2

## 2015-04-09 MED ORDER — LIDOCAINE HCL (CARDIAC) 20 MG/ML IV SOLN
INTRAVENOUS | Status: DC | PRN
  Administered 2015-04-09: 60 mg via INTRAVENOUS

## 2015-04-09 MED ORDER — LACTATED RINGERS IV SOLN
INTRAVENOUS | Status: DC
  Administered 2015-04-09 (×2): via INTRAVENOUS

## 2015-04-09 MED ORDER — EPHEDRINE SULFATE 50 MG/ML IJ SOLN
INTRAMUSCULAR | Status: DC | PRN
  Administered 2015-04-09: 10 mg via INTRAVENOUS

## 2015-04-09 MED ORDER — FENTANYL CITRATE (PF) 100 MCG/2ML IJ SOLN
50.0000 ug | INTRAMUSCULAR | Status: DC | PRN
  Administered 2015-04-09: 100 ug via INTRAVENOUS

## 2015-04-09 MED ORDER — HYDROMORPHONE HCL 1 MG/ML IJ SOLN
INTRAMUSCULAR | Status: AC
  Filled 2015-04-09: qty 1

## 2015-04-09 MED ORDER — GLYCOPYRROLATE 0.2 MG/ML IJ SOLN
INTRAMUSCULAR | Status: AC
  Filled 2015-04-09: qty 1

## 2015-04-09 MED ORDER — FENTANYL CITRATE (PF) 100 MCG/2ML IJ SOLN
INTRAMUSCULAR | Status: AC
  Filled 2015-04-09: qty 4

## 2015-04-09 MED ORDER — DEXAMETHASONE SODIUM PHOSPHATE 10 MG/ML IJ SOLN
INTRAMUSCULAR | Status: AC
  Filled 2015-04-09: qty 1

## 2015-04-09 MED ORDER — MIDAZOLAM HCL 2 MG/2ML IJ SOLN
1.0000 mg | INTRAMUSCULAR | Status: DC | PRN
Start: 2015-04-09 — End: 2015-04-09
  Administered 2015-04-09: 2 mg via INTRAVENOUS

## 2015-04-09 MED ORDER — HYDROMORPHONE HCL 1 MG/ML IJ SOLN
0.2500 mg | INTRAMUSCULAR | Status: DC | PRN
  Administered 2015-04-09: 0.5 mg via INTRAVENOUS

## 2015-04-09 MED ORDER — BUPIVACAINE-EPINEPHRINE 0.25% -1:200000 IJ SOLN
INTRAMUSCULAR | Status: DC | PRN
  Administered 2015-04-09: 30 mL

## 2015-04-09 MED ORDER — GLYCOPYRROLATE 0.2 MG/ML IJ SOLN
0.2000 mg | Freq: Once | INTRAMUSCULAR | Status: AC | PRN
  Administered 2015-04-09 (×2): 0.2 mg via INTRAVENOUS

## 2015-04-09 MED ORDER — MIDAZOLAM HCL 2 MG/2ML IJ SOLN
INTRAMUSCULAR | Status: AC
  Filled 2015-04-09: qty 4

## 2015-04-09 MED ORDER — TRAMADOL HCL 50 MG PO TABS: 50.0000 mg | ORAL_TABLET | Freq: Four times a day (QID) | ORAL | Status: DC | PRN

## 2015-04-09 MED ORDER — SCOPOLAMINE 1 MG/3DAYS TD PT72: 1.0000 | MEDICATED_PATCH | Freq: Once | TRANSDERMAL | Status: DC | PRN

## 2015-04-09 MED ORDER — MEPERIDINE HCL 25 MG/ML IJ SOLN: 6.2500 mg | INTRAMUSCULAR | Status: DC | PRN

## 2015-04-09 MED ORDER — PROPOFOL 10 MG/ML IV BOLUS
INTRAVENOUS | Status: DC | PRN
  Administered 2015-04-09: 200 mg via INTRAVENOUS

## 2015-04-09 MED ORDER — CHLORHEXIDINE GLUCONATE 4 % EX LIQD: 1.0000 "application " | Freq: Once | CUTANEOUS | Status: DC

## 2015-04-09 MED ORDER — CEFAZOLIN SODIUM-DEXTROSE 2-3 GM-% IV SOLR
2.0000 g | INTRAVENOUS | Status: AC
  Administered 2015-04-09: 2 g via INTRAVENOUS

## 2015-04-09 SURGICAL SUPPLY — 48 items
APL SKNCLS STERI-STRIP NONHPOA (GAUZE/BANDAGES/DRESSINGS)
BENZOIN TINCTURE PRP APPL 2/3 (GAUZE/BANDAGES/DRESSINGS) IMPLANT
BINDER BREAST LRG (GAUZE/BANDAGES/DRESSINGS) IMPLANT
BINDER BREAST MEDIUM (GAUZE/BANDAGES/DRESSINGS) IMPLANT
BINDER BREAST XLRG (GAUZE/BANDAGES/DRESSINGS) IMPLANT
BINDER BREAST XXLRG (GAUZE/BANDAGES/DRESSINGS) IMPLANT
BLADE HEX COATED 2.75 (ELECTRODE) IMPLANT
BLADE SURG 10 STRL SS (BLADE) ×2 IMPLANT
BLADE SURG 15 STRL LF DISP TIS (BLADE) ×1 IMPLANT
BLADE SURG 15 STRL SS (BLADE) ×1
CANISTER SUC SOCK COL 7IN (MISCELLANEOUS) IMPLANT
CANISTER SUCT 1200ML W/VALVE (MISCELLANEOUS) ×2 IMPLANT
CHLORAPREP W/TINT 26ML (MISCELLANEOUS) ×2 IMPLANT
CLIP TI WIDE RED SMALL 6 (CLIP) IMPLANT
COVER BACK TABLE 60X90IN (DRAPES) ×2 IMPLANT
COVER MAYO STAND STRL (DRAPES) ×2 IMPLANT
COVER PROBE W GEL 5X96 (DRAPES) ×2 IMPLANT
DECANTER SPIKE VIAL GLASS SM (MISCELLANEOUS) IMPLANT
DEVICE DUBIN W/COMP PLATE 8390 (MISCELLANEOUS) ×2 IMPLANT
DRAPE LAPAROTOMY 100X72 PEDS (DRAPES) ×2 IMPLANT
DRAPE UTILITY XL STRL (DRAPES) ×2 IMPLANT
DRSG PAD ABDOMINAL 8X10 ST (GAUZE/BANDAGES/DRESSINGS) IMPLANT
ELECT COATED BLADE 2.86 ST (ELECTRODE) ×2 IMPLANT
ELECT REM PT RETURN 9FT ADLT (ELECTROSURGICAL) ×2
ELECTRODE REM PT RTRN 9FT ADLT (ELECTROSURGICAL) ×1 IMPLANT
GLOVE SURG SIGNA 7.5 PF LTX (GLOVE) ×2 IMPLANT
GOWN STRL REUS W/ TWL LRG LVL3 (GOWN DISPOSABLE) ×1 IMPLANT
GOWN STRL REUS W/ TWL XL LVL3 (GOWN DISPOSABLE) ×1 IMPLANT
GOWN STRL REUS W/TWL LRG LVL3 (GOWN DISPOSABLE) ×1
GOWN STRL REUS W/TWL XL LVL3 (GOWN DISPOSABLE) ×1
KIT MARKER MARGIN INK (KITS) ×2 IMPLANT
LIQUID BAND (GAUZE/BANDAGES/DRESSINGS) ×2 IMPLANT
NEEDLE HYPO 25X1 1.5 SAFETY (NEEDLE) ×2 IMPLANT
NS IRRIG 1000ML POUR BTL (IV SOLUTION) IMPLANT
PACK BASIN DAY SURGERY FS (CUSTOM PROCEDURE TRAY) ×2 IMPLANT
PENCIL BUTTON HOLSTER BLD 10FT (ELECTRODE) ×2 IMPLANT
SHEET MEDIUM DRAPE 40X70 STRL (DRAPES) IMPLANT
SLEEVE SCD COMPRESS KNEE MED (MISCELLANEOUS) ×2 IMPLANT
SPONGE GAUZE 4X4 12PLY STER LF (GAUZE/BANDAGES/DRESSINGS) IMPLANT
SPONGE LAP 18X18 X RAY DECT (DISPOSABLE) ×2 IMPLANT
STRIP CLOSURE SKIN 1/4X4 (GAUZE/BANDAGES/DRESSINGS) IMPLANT
SUT MON AB 5-0 PS2 18 (SUTURE) ×2 IMPLANT
SUT VICRYL 3-0 CR8 SH (SUTURE) ×2 IMPLANT
SYR CONTROL 10ML LL (SYRINGE) ×2 IMPLANT
TOWEL OR 17X24 6PK STRL BLUE (TOWEL DISPOSABLE) ×2 IMPLANT
TOWEL OR NON WOVEN STRL DISP B (DISPOSABLE) ×2 IMPLANT
TUBE CONNECTING 20X1/4 (TUBING) ×2 IMPLANT
YANKAUER SUCT BULB TIP NO VENT (SUCTIONS) ×2 IMPLANT

## 2015-04-09 NOTE — Op Note (Signed)
04/09/2015  12:44 PM  PATIENT:  Katelyn [name] DOB: [birth year] MRN: [id number]  PREOP DIAGNOSIS:  right breast papilloma  POSTOP DIAGNOSIS:   right breast papilloma, 3 o'clock (subareolar) (final path pending)  PROCEDURE:   Procedure(s):   RIGHT BREAST LUMPECTOMY WITH RADIOACTIVE SEED LOCALIZATION  SURGEON:   Alphonsa Overall, M.D.  ANESTHESIA:   general  Anesthesiologist: Duane Boston, MD; Lillia Abed, MD CRNA: Maryella Shivers, CRNA; Marrianne Mood, CRNA  General  EBL:  minmal  ml  DRAINS: none   LOCAL MEDICATIONS USED:   30 cc 1/4% marcaine  SPECIMEN:   Right breast lumpectomy, suture medial and painted  COUNTS CORRECT:  YES  INDICATIONS FOR PROCEDURE:  [name] is a 64 y.o. (DOB: 13-Nov-1950) white  female whose primary care physician is Hoyt Koch, MD and comes for right breast lumpectomy.   She had a right breast biopsy on 02/25/2015 that showed a right breast papilloma at 3 o'clock in the right breast.  She comes for excision of this papilloma.    The indications and potential complications of surgery were explained to the patient. Potential complications include, but are not limited to, bleeding, infection, the need for further surgery, and nerve injury.     She had a I131 seed placed on 04/08/2015 in her right breast at 3 o'clock.  I confirmed the presence of the I131 seed in the pre op area using the Neoprobe.  The seed is in the 3 o'clock position of the right breast.  OPERATIVE NOTE:   The patient was taken to room # 2 at [facility] where she underwent a general anesthesia  supervised by Anesthesiologist: Duane Boston, MD; Lillia Abed, MD CRNA: Maryella Shivers, CRNA; Marrianne Mood, CRNA. Her right breast and axilla were prepped with  ChloraPrep and sterilely draped.    A time-out and the surgical check list was reviewed.    I made a right circumareolar incision which went from 12 o'clock to 5 o'clock on the edge of the areola.      I used  the Neoprobe to identify the I131 seed.  I tried to excise an area around the tumor of at least 1 cm.    I excised this block of breast tissue approximately 3 cm by 4 cm  in diameter.   I confirmed that the seed was in the specimen with the neoprobe.    I painted the lumpectomy specimen with the 6 color paint kit and did a specimen mammogram which confirmed the mass, clip, and the seed were all in the right position in the specimen.  The specimen was sent to pathology who called back to confirm that they have the seed and the specimen.   I then irrigated the wound with saline.   I then closed all the wounds in layers using 3-0 Vicryl sutures for the deep layer. At the skin, I closed the incisions with a 4-0 Monocryl suture. The incisions were then painted with LiquiBand.  She had gauze place over the wounds and placed in a breast binder.   The patient tolerated the procedure well, was transported to the recovery room in good condition. Sponge and needle count were correct at the end of the case.   Final pathology is pending.   Alphonsa Overall, MD, [facility] Pager: [phone] Office phone:  [phone]

## 2015-04-09 NOTE — Transfer of Care (Signed)
Immediate Anesthesia Transfer of Care Note  Patient: Katelyn [name]  Procedure(s) Performed: Procedure(s): RIGHT BREAST LUMPECTOMY WITH RADIOACTIVE SEED LOCALIZATION (Right)  Patient Location: PACU  Anesthesia Type:General  Level of Consciousness: awake and patient cooperative  Airway & Oxygen Therapy: Patient Spontanous Breathing and Patient connected to face mask oxygen  Post-op Assessment: Report given to RN and Post -op Vital signs reviewed and stable  Post vital signs: Reviewed and stable  Last Vitals:  Filed Vitals:   04/09/15 1255  BP:   Pulse: 69  Temp:   Resp: 12    Complications: No apparent anesthesia complications

## 2015-04-09 NOTE — Anesthesia Procedure Notes (Addendum)
Procedure Name: LMA Insertion Date/Time: 04/09/2015 11:50 AM Performed by: Maryella Shivers Pre-anesthesia Checklist: Patient identified, Emergency Drugs available, Suction available, Patient being monitored and Timeout performed Patient Re-evaluated:Patient Re-evaluated prior to inductionOxygen Delivery Method: Circle System Utilized Preoxygenation: Pre-oxygenation with 100% oxygen Intubation Type: IV induction Ventilation: Mask ventilation without difficulty LMA: LMA inserted LMA Size: 4.0 Number of attempts: 1 Airway Equipment and Method: Bite block Placement Confirmation: positive ETCO2 Tube secured with: Tape Dental Injury: Teeth and Oropharynx as per pre-operative assessment

## 2015-04-09 NOTE — Anesthesia Postprocedure Evaluation (Signed)
Anesthesia Post Note  Patient: Katelyn [name]  Procedure(s) Performed: Procedure(s) (LRB): RIGHT BREAST LUMPECTOMY WITH RADIOACTIVE SEED LOCALIZATION (Right)  Anesthesia type: general  Patient location: PACU  Post pain: Pain level controlled  Post assessment: Patient's Cardiovascular Status Stable  Last Vitals:  Filed Vitals:   04/09/15 1410  BP: 132/71  Pulse: 67  Temp: 36.9 C  Resp: 18    Post vital signs: Reviewed and stable  Level of consciousness: sedated  Complications: No apparent anesthesia complications

## 2015-04-09 NOTE — Interval H&P Note (Signed)
History and Physical Interval Note:  04/09/2015 11:40 AM  Katelyn [name]  has presented today for surgery, with the diagnosis of right breast papilloma  The various methods of treatment have been discussed with the patient and family.  Her son dropped off and will pick her up.  After consideration of risks, benefits and other options for treatment, the patient has consented to  Procedure(s): RIGHT BREAST LUMPECTOMY WITH RADIOACTIVE SEED LOCALIZATION (Right) as a surgical intervention .  The patient's history has been reviewed, patient examined, no change in status, stable for surgery.  I have reviewed the patient's chart and labs.  Questions were answered to the patient's satisfaction.     Alexes Lamarque H

## 2015-04-09 NOTE — Anesthesia Preprocedure Evaluation (Signed)
Anesthesia Evaluation  Patient identified by MRN, date of birth, ID band Patient awake    Reviewed: Allergy & Precautions, NPO status , Patient's Chart, lab work & pertinent test results  Airway Mallampati: I  TM Distance: >3 FB Neck ROM: Full    Dental  (+) Teeth Intact, Dental Advisory Given   Pulmonary    breath sounds clear to auscultation       Cardiovascular hypertension, Pt. on medications  Rhythm:Regular Rate:Normal     Neuro/Psych    GI/Hepatic GERD  Medicated and Controlled,  Endo/Other    Renal/GU      Musculoskeletal   Abdominal   Peds  Hematology   Anesthesia Other Findings   Reproductive/Obstetrics                             Anesthesia Physical Anesthesia Plan  ASA: II  Anesthesia Plan: General   Post-op Pain Management:    Induction: Intravenous  Airway Management Planned: LMA  Additional Equipment:   Intra-op Plan:   Post-operative Plan: Extubation in OR  Informed Consent: I have reviewed the patients History and Physical, chart, labs and discussed the procedure including the risks, benefits and alternatives for the proposed anesthesia with the patient or authorized representative who has indicated his/her understanding and acceptance.   Dental advisory given  Plan Discussed with: CRNA, Anesthesiologist and Surgeon  Anesthesia Plan Comments:         Anesthesia Quick Evaluation  

## 2015-04-09 NOTE — Discharge Instructions (Signed)
[facility] - DISCHARGE INSTRUCTIONS TO PATIENT   Activity:  Driving - May drive in one or two days, if doing well   Lifting - No lifting more than 15 pounds for 1 week, then no limit  Wound Care:   Leave bandage until Sunday.  Then you may remove the bandage and shower.  Diet:  As tolerated  Follow up appointment:  Call Dr. Pollie Friar office [facility]) at [phone] for an appointment in 2 to 3 weeks.  Medications and dosages:  Resume your home medications.  You have a prescription for:  Ultam  Call Dr. Lucia Gaskins or his office  [phone]) if you have:  Temperature greater than 100.4,  Persistent nausea and vomiting,  Severe uncontrolled pain,  Redness, tenderness, or signs of infection (pain, swelling, redness, odor or green/yellow discharge around the site),  Difficulty breathing, headache or visual disturbances,  Any other questions or concerns you may have after discharge.  In an emergency, call 911 or go to an Emergency Department at a nearby hospital.    Post Anesthesia Home Care Instructions  Activity: Get plenty of rest for the remainder of the day. A responsible adult should stay with you for 24 hours following the procedure.  For the next 24 hours, DO NOT: -Drive a car -Paediatric nurse -Drink alcoholic beverages -Take any medication unless instructed by your physician -Make any legal decisions or sign important papers.  Meals: Start with liquid foods such as gelatin or soup. Progress to regular foods as tolerated. Avoid greasy, spicy, heavy foods. If nausea and/or vomiting occur, drink only clear liquids until the nausea and/or vomiting subsides. Call your physician if vomiting continues.  Special Instructions/Symptoms: Your throat may feel dry or sore from the anesthesia or the breathing tube placed in your throat during surgery. If this causes discomfort, gargle with warm salt water. The discomfort should disappear within 24  hours.  If you had a scopolamine patch placed behind your ear for the management of post- operative nausea and/or vomiting:  1. The medication in the patch is effective for 72 hours, after which it should be removed.  Wrap patch in a tissue and discard in the trash. Wash hands thoroughly with soap and water. 2. You may remove the patch earlier than 72 hours if you experience unpleasant side effects which may include dry mouth, dizziness or visual disturbances. 3. Avoid touching the patch. Wash your hands with soap and water after contact with the patch.

## 2015-04-12 ENCOUNTER — Encounter (HOSPITAL_BASED_OUTPATIENT_CLINIC_OR_DEPARTMENT_OTHER): Payer: Self-pay | Admitting: Surgery

## 2015-05-24 ENCOUNTER — Encounter: Payer: Self-pay | Admitting: Internal Medicine

## 2015-05-24 ENCOUNTER — Ambulatory Visit (INDEPENDENT_AMBULATORY_CARE_PROVIDER_SITE_OTHER): Payer: BLUE CROSS/BLUE SHIELD | Admitting: Internal Medicine

## 2015-05-24 VITALS — BP 162/92 | HR 62 | Temp 98.4°F | Resp 12 | Ht 67.0 in | Wt 191.0 lb

## 2015-05-24 DIAGNOSIS — I10 Essential (primary) hypertension: Secondary | ICD-10-CM

## 2015-05-24 DIAGNOSIS — Z23 Encounter for immunization: Secondary | ICD-10-CM | POA: Diagnosis not present

## 2015-05-24 DIAGNOSIS — G43009 Migraine without aura, not intractable, without status migrainosus: Secondary | ICD-10-CM

## 2015-05-24 DIAGNOSIS — N6452 Nipple discharge: Secondary | ICD-10-CM

## 2015-05-24 DIAGNOSIS — I4891 Unspecified atrial fibrillation: Secondary | ICD-10-CM

## 2015-05-24 DIAGNOSIS — F329 Major depressive disorder, single episode, unspecified: Secondary | ICD-10-CM | POA: Diagnosis not present

## 2015-05-24 DIAGNOSIS — F32A Depression, unspecified: Secondary | ICD-10-CM

## 2015-05-24 MED ORDER — SUMATRIPTAN SUCCINATE 100 MG PO TABS: 100.0000 mg | ORAL_TABLET | Freq: Two times a day (BID) | ORAL | Status: DC | PRN

## 2015-05-24 MED ORDER — FLUOXETINE HCL 20 MG PO CAPS: 40.0000 mg | ORAL_CAPSULE | Freq: Every day | ORAL | Status: DC

## 2015-05-24 MED ORDER — DILTIAZEM HCL ER COATED BEADS 120 MG PO CP24: 120.0000 mg | ORAL_CAPSULE | Freq: Every day | ORAL | Status: DC

## 2015-05-24 MED ORDER — PROPRANOLOL HCL 80 MG PO TABS: 80.0000 mg | ORAL_TABLET | Freq: Three times a day (TID) | ORAL | Status: DC

## 2015-05-24 NOTE — Patient Instructions (Signed)
We have sent in the refills today.   We have given you the tetanus and the flu shot today.   Come back next month for the shingles and the pneumonia shot.   Come back in about 6 months and please call us sooner if you have any problems or questions.

## 2015-05-24 NOTE — Assessment & Plan Note (Signed)
BP slightly elevated but recheck more normal. Refilled her diltiazem and propanolol. She has lost 20 pounds since last visit which is great. She will continue to work on exercise and losing weight gradually.

## 2015-05-24 NOTE — Assessment & Plan Note (Signed)
Uses propanolol for ppx and doing well. Rare migraine. Slightly more after her recent dental work. Refilled sumatriptan as well.

## 2015-05-24 NOTE — Assessment & Plan Note (Signed)
Excision of benign etiology and discharge is resolved. Routine healing appropriate on exam.

## 2015-05-24 NOTE — Progress Notes (Signed)
Pre visit review using our clinic review tool, if applicable. No additional management support is needed unless otherwise documented below in the visit note. 

## 2015-05-24 NOTE — Progress Notes (Signed)
   Subjective:    Patient ID: Katelyn [name], female    DOB: [birth year], 64 y.o.   MRN: [id number]  HPI The patient is a 64 YO female coming in for follow up of her weight and her blood pressure. She is doing well with her medicines. Denies side effects. Denies headaches, chest pains, SOB. She has had removal of the lump which was causing the nipple discharge and this is now gone. No new concerns or problems.   Review of Systems  Constitutional: Negative for fever, activity change, appetite change, fatigue and unexpected weight change.  HENT: Negative.   Eyes: Negative.   Respiratory: Negative for cough, chest tightness, shortness of breath and wheezing.   Cardiovascular: Negative for chest pain, palpitations and leg swelling.  Gastrointestinal: Negative for abdominal pain and abdominal distention.  Musculoskeletal: Negative for myalgias, back pain, arthralgias and gait problem.  Skin: Negative.   Neurological: Negative for dizziness, tremors, seizures, syncope, weakness, light-headedness and headaches.  Psychiatric/Behavioral: Negative.       Objective:   Physical Exam  Constitutional: She is oriented to person, place, and time. She appears well-developed and well-nourished.  Overweight  HENT:  Head: Normocephalic and atraumatic.  Eyes: EOM are normal.  Neck: Normal range of motion.  Cardiovascular: Normal rate and regular rhythm.   Pulmonary/Chest: Effort normal and breath sounds normal. No respiratory distress. She has no wheezes. She has no rales.  Abdominal: Soft. Bowel sounds are normal. She exhibits no distension. There is no tenderness. There is no rebound.  Right nipple examined with normal healing and no discharge.   Musculoskeletal: She exhibits no edema.  Neurological: She is alert and oriented to person, place, and time. Coordination normal.  Skin: Skin is warm and dry.   Filed Vitals:   05/24/15 1313  BP: 162/92  Pulse: 62  Temp: 98.4 F (36.9 C)  TempSrc:  Oral  Resp: 12  Height: 5\' 7"  (1.702 m)  Weight: 191 lb (86.637 kg)  SpO2: 94%      Assessment & Plan:  Flu and tdap given at visit.

## 2015-05-24 NOTE — Addendum Note (Signed)
Addended by: Resa Miner R on: 05/24/2015 03:47 PM   Modules accepted: Orders

## 2015-06-24 ENCOUNTER — Ambulatory Visit (INDEPENDENT_AMBULATORY_CARE_PROVIDER_SITE_OTHER): Payer: BLUE CROSS/BLUE SHIELD | Admitting: Internal Medicine

## 2015-06-24 ENCOUNTER — Encounter: Payer: Self-pay | Admitting: Internal Medicine

## 2015-06-24 VITALS — BP 142/82 | HR 59 | Ht 67.0 in | Wt 188.0 lb

## 2015-06-24 DIAGNOSIS — I10 Essential (primary) hypertension: Secondary | ICD-10-CM

## 2015-06-24 DIAGNOSIS — R002 Palpitations: Secondary | ICD-10-CM

## 2015-06-24 NOTE — Progress Notes (Signed)
Cardiology Office Note   Date:  06/24/2015   ID:  Katelyn [name], Katelyn [name] [birth year], MRN [id number]  PCP:  Katelyn Koch, MD  Cardiologist:   Katelyn Carnes, MD   F/U of interimtt atrial fib      History of Present Illness: Katelyn [name] is a 65 y.o. female with a history of intermitt atrial fib. HTN She has had a few spells of tachycardia that have converted prior to any EKG  / exam Also a history of GI bleed in past.   Echo done LVEF normal MOd diastolic dysfunciton. Mild LAD \\  Since seen has had another spell Lasted about 30 min Was dizzy As HR slowed felt less dizzy Fetl heart heart When not having spells is very active, no problem   I saw the pt in September   Since then she has done fairly well  Had 1 spell  HR usually in 75s  Went up to 110s  irregg  In 15 min had resolved  That was about 2 wks ago  Otherwise she has lost about 20 lbs  Trying to increase her activity  Walks about 3000 steps per day      Current Outpatient Prescriptions  Medication Sig Dispense Refill  . aspirin 325 MG EC tablet Take 325 mg by mouth daily.    Marland Kitchen diltiazem (CARDIZEM CD) 120 MG 24 hr capsule Take 1 capsule (120 mg total) by mouth daily. 90 capsule 3  . Ferrous Sulfate (IRON) 325 (65 FE) MG TABS Take 325 mg by mouth 2 (two) times daily. (Patient taking differently: Take 325 mg by mouth daily. ) 60 each 3  . FLUoxetine (PROZAC) 20 MG capsule Take 2 capsules (40 mg total) by mouth daily. 180 capsule 3  . omeprazole (PRILOSEC OTC) 20 MG tablet Take 20 mg by mouth as needed (for heartburn).     . propranolol (INDERAL) 80 MG tablet Take 1 tablet (80 mg total) by mouth 3 (three) times daily. 270 tablet 3  . SUMAtriptan (IMITREX) 100 MG tablet Take 1 tablet (100 mg total) by mouth 2 (two) times daily as needed for migraine. 10 tablet 3  . [DISCONTINUED] atorvastatin (LIPITOR) 20 MG tablet Take 20 mg by mouth daily.      . [DISCONTINUED] benazepril (LOTENSIN) 20 MG tablet Take 20  mg by mouth daily.      . [DISCONTINUED] valsartan-hydrochlorothiazide (DIOVAN-HCT) 160-12.5 MG per tablet Take 1 tablet by mouth daily.      . [DISCONTINUED] Venlafaxine HCl 225 MG TB24 Take by mouth daily.       No current facility-administered medications for this visit.    Allergies:   Codeine; Lisinopril; and Morphine   Past Medical History  Diagnosis Date  . ANXIETY DEPRESSION 04/14/2009  . Atrial fibrillation ([place]) 04/14/2009  . GERD 09/07/2009  . HYPERLIPIDEMIA 09/07/2009  . HYPERTENSION 07/02/2009  . KNEE PAIN, RIGHT 12/07/2009  . MIGRAINE UNSP W/O INTRACT W/O STATUS MIGRAINOSUS 07/02/2009  . Anemia   . Recurrent UTI   . Anxiety   . Papilloma of right breast   . PONV (postoperative nausea and vomiting)     Past Surgical History  Procedure Laterality Date  . Appendectomy  1964  . Abdominal hysterectomy  1997  . Septorhinoplasty  1979  . Colonoscopy N/A 03/26/2014    Procedure: COLONOSCOPY;  Surgeon: Katelyn Banister, MD;  Location: [facility];  Service: Endoscopy;  Laterality: N/A;  . Esophagogastroduodenoscopy N/A 03/26/2014    Procedure: ESOPHAGOGASTRODUODENOSCOPY (EGD);  Surgeon: Katelyn Banister, MD;  Location: [facility];  Service: Endoscopy;  Laterality: N/A;  . Breast lumpectomy with radioactive seed localization Right 04/09/2015    Procedure: RIGHT BREAST LUMPECTOMY WITH RADIOACTIVE SEED LOCALIZATION;  Surgeon: Katelyn Overall, MD;  Location: [facility];  Service: General;  Laterality: Right;     Social History:  The patient  reports that she has never smoked. She has never used smokeless tobacco. She reports that she drinks alcohol. She reports that she does not use illicit drugs.   Family History:  The patient's family history includes Breast cancer in her mother; Hypertension in her father, mother, paternal grandfather, and paternal grandmother; Lung cancer in her father.    ROS:  Please see the history of present illness. All other systems are  reviewed and  Negative to the above problem except as noted.    PHYSICAL EXAM: VS:  BP 142/82 mmHg  Pulse 59  Ht 5\' 7"  (1.702 m)  Wt 85.276 kg (188 lb)  BMI 29.44 kg/m2  SpO2 98%  GEN: Well nourished, well developed, in no acute distress HEENT: normal Neck: no JVD, carotid bruits, or masses Cardiac: RRR; no murmurs, rubs, or gallops,no edema  Respiratory:  clear to auscultation bilaterally, normal work of breathing GI: soft, nontender, nondistended, + BS  No hepatomegaly  MS: no deformity Moving all extremities   Skin: warm and dry, no rash Neuro:  Strength and sensation are intact Psych: euthymic mood, full affect   EKG:  EKG is not  ordered today.   Lipid Panel    Component Value Date/Time   CHOL 235* 09/16/2014 0919   TRIG 185* 09/16/2014 0919   HDL 41* 09/16/2014 0919   CHOLHDL 5.7 09/16/2014 0919   VLDL 37 09/16/2014 0919   LDLCALC 157* 09/16/2014 0919   LDLDIRECT 118.9 12/07/2009 0953      Wt Readings from Last 3 Encounters:  06/24/15 85.276 kg (188 lb)  05/24/15 86.637 kg (191 lb)  04/09/15 86.75 kg (191 lb 4 oz)      ASSESSMENT AND PLAN:  1  Rhtyhm   Transient spell rare of palpitations  Afib has not been cought  Question SVT She now has fit bit monitor which should help Recomm that she stay on current regimen  F/U later year  USe fit bit   Increase activity  2  HTN   Adequate control   Signed, Katelyn Carnes, MD  06/24/2015 11:06 AM    [facility] [address], [place], [place]  [address] Phone: [phone]; Fax: [phone]

## 2015-06-24 NOTE — Patient Instructions (Signed)
Your physician recommends that you continue on your current medications as directed. Please refer to the Current Medication list given to you today. Your physician wants you to follow-up in: July 2017 with Dr. Harrington Challenger.  You will receive a reminder letter in the mail two months in advance. If you don't receive a letter, please call our office to schedule the follow-up appointment.

## 2015-06-25 ENCOUNTER — Ambulatory Visit: Payer: BLUE CROSS/BLUE SHIELD

## 2015-06-28 ENCOUNTER — Ambulatory Visit: Payer: BLUE CROSS/BLUE SHIELD | Admitting: Internal Medicine

## 2015-07-28 ENCOUNTER — Ambulatory Visit (INDEPENDENT_AMBULATORY_CARE_PROVIDER_SITE_OTHER): Payer: BLUE CROSS/BLUE SHIELD | Admitting: Family Medicine

## 2015-07-28 VITALS — BP 149/91 | HR 61 | Temp 98.8°F | Resp 16 | Ht 67.5 in | Wt 191.0 lb

## 2015-07-28 DIAGNOSIS — J208 Acute bronchitis due to other specified organisms: Secondary | ICD-10-CM

## 2015-07-28 MED ORDER — DOXYCYCLINE HYCLATE 100 MG PO CAPS: 100.0000 mg | ORAL_CAPSULE | Freq: Two times a day (BID) | ORAL | Status: DC

## 2015-07-28 MED ORDER — ALBUTEROL SULFATE HFA 108 (90 BASE) MCG/ACT IN AERS: 2.0000 | INHALATION_SPRAY | Freq: Four times a day (QID) | RESPIRATORY_TRACT | Status: DC | PRN

## 2015-07-28 NOTE — Progress Notes (Signed)
[facility] [address], [place] [address] [phone]  Date:  07/28/2015   Name:  Katelyn [name]   DOB:  11-24-1950   MRN:  [id number]  PCP:  Hoyt Koch, MD    Chief Complaint: Sinusitis; Cough; and Headache   History of Present Illness:  Katelyn [name] is a 65 y.o. very pleasant female patient who presents with the following:  Here today as a new patient with complaint of illness for about one month.  Her sx seemed to start with a virus and then would wax and wane- at this point her symptoms have settled into her chest.   She notes PND, a tickle in her throat, an intermittent HA, expiratory wheeze, chest congestion. She can have some strong cough attacks that are slightly productive  No runny nose She has not noted any fevers, she does have some fatigue and aches.   Her sx are mostly in her chest but she does have a bit of sinus pressure  No GI symptoms.  She has tried some OTC cold preps and flonase History of HTN and possible intermittent atrial fib- this is still being evaluated.  She has had some palpitations but fibrillation not yet documented per her report   Patient Active Problem List   Diagnosis Date Noted  . Nipple discharge 12/25/2014  . Hiatal hernia 03/26/2014  . Diverticulosis of colon without hemorrhage 03/26/2014  . Iron deficiency anemia 03/26/2014  . Essential hypertension, benign 08/06/2013  . Migraine 08/06/2013  . ANXIETY DEPRESSION 04/14/2009  . Atrial fibrillation ([place]) 04/14/2009    Past Medical History  Diagnosis Date  . ANXIETY DEPRESSION 04/14/2009  . Atrial fibrillation ([place]) 04/14/2009  . GERD 09/07/2009  . HYPERLIPIDEMIA 09/07/2009  . HYPERTENSION 07/02/2009  . KNEE PAIN, RIGHT 12/07/2009  . MIGRAINE UNSP W/O INTRACT W/O STATUS MIGRAINOSUS 07/02/2009  . Anemia   . Recurrent UTI   . Anxiety   . Papilloma of right breast   . PONV (postoperative nausea and vomiting)     Past Surgical History   Procedure Laterality Date  . Appendectomy  1964  . Abdominal hysterectomy  1997  . Septorhinoplasty  1979  . Colonoscopy N/A 03/26/2014    Procedure: COLONOSCOPY;  Surgeon: Milus Banister, MD;  Location: [facility];  Service: Endoscopy;  Laterality: N/A;  . Esophagogastroduodenoscopy N/A 03/26/2014    Procedure: ESOPHAGOGASTRODUODENOSCOPY (EGD);  Surgeon: Milus Banister, MD;  Location: [facility];  Service: Endoscopy;  Laterality: N/A;  . Breast lumpectomy with radioactive seed localization Right 04/09/2015    Procedure: RIGHT BREAST LUMPECTOMY WITH RADIOACTIVE SEED LOCALIZATION;  Surgeon: Alphonsa Overall, MD;  Location: [facility];  Service: General;  Laterality: Right;    Social History  Substance Use Topics  . Smoking status: Never Smoker   . Smokeless tobacco: Never Used  . Alcohol Use: 0.0 oz/week    0 Standard drinks or equivalent per week     Comment: social occasional    Family History  Problem Relation Age of Onset  . Hypertension Mother   . Breast cancer Mother   . Hypertension Father   . Lung cancer Father     textiles and smoker  . Hypertension Paternal Grandmother   . Hypertension Paternal Grandfather     Allergies  Allergen Reactions  . Codeine Nausea And Vomiting  . Lisinopril Cough  . Morphine Hives, Itching and Nausea And Vomiting    Medication list has been reviewed and updated.  Current Outpatient Prescriptions on File Prior to Visit  Medication Sig Dispense Refill  . aspirin 325 MG EC tablet Take 325 mg by mouth daily.    Marland Kitchen diltiazem (CARDIZEM CD) 120 MG 24 hr capsule Take 1 capsule (120 mg total) by mouth daily. 90 capsule 3  . Ferrous Sulfate (IRON) 325 (65 FE) MG TABS Take 325 mg by mouth 2 (two) times daily. (Patient taking differently: Take 325 mg by mouth daily. ) 60 each 3  . FLUoxetine (PROZAC) 20 MG capsule Take 2 capsules (40 mg total) by mouth daily. 180 capsule 3  . omeprazole (PRILOSEC OTC) 20 MG tablet Take 20 mg by  mouth as needed (for heartburn).     . propranolol (INDERAL) 80 MG tablet Take 1 tablet (80 mg total) by mouth 3 (three) times daily. 270 tablet 3  . SUMAtriptan (IMITREX) 100 MG tablet Take 1 tablet (100 mg total) by mouth 2 (two) times daily as needed for migraine. 10 tablet 3  . [DISCONTINUED] atorvastatin (LIPITOR) 20 MG tablet Take 20 mg by mouth daily.      . [DISCONTINUED] benazepril (LOTENSIN) 20 MG tablet Take 20 mg by mouth daily.      . [DISCONTINUED] valsartan-hydrochlorothiazide (DIOVAN-HCT) 160-12.5 MG per tablet Take 1 tablet by mouth daily.      . [DISCONTINUED] Venlafaxine HCl 225 MG TB24 Take by mouth daily.       No current facility-administered medications on file prior to visit.    Review of Systems:  As per HPI- otherwise negative.   Physical Examination: Filed Vitals:   07/28/15 1623  BP: 149/91  Pulse: 61  Temp: 98.8 F (37.1 C)  Resp: 16   Filed Vitals:   07/28/15 1623  Height: 5' 7.5" (1.715 m)  Weight: 191 lb (86.637 kg)   Body mass index is 29.46 kg/(m^2). Ideal Body Weight: Weight in (lb) to have BMI = 25: 161.7  GEN: WDWN, NAD, Non-toxic, A & O x 3, overweight, looks well HEENT: Atraumatic, Normocephalic. Neck supple. No masses, No LAD.  Bilateral TM wnl, oropharynx normal.  PEERL,EOMI.   Ears and Nose: No external deformity. CV: RRR, No M/G/R. No JVD. No thrill. No extra heart sounds. PULM: CTA B, no wheezes, crackles, rhonchi. No retractions. No resp. distress. No accessory muscle use. EXTR: No c/c/e NEURO Normal gait.  PSYCH: Normally interactive. Conversant. Not depressed or anxious appearing.  Calm demeanor.    Assessment and Plan: Acute bronchitis due to other specified organisms - Plan: albuterol (PROVENTIL HFA;VENTOLIN HFA) 108 (90 Base) MCG/ACT inhaler, doxycycline (VIBRAMYCIN) 100 MG capsule  Here today with sx of bronchitis for one month, as well as intermittent wheezing.  rx for doxycycline and albuterol to use as needed-  cautioned her to minimize use as this can exacerbate tendency towards arrythmia  Meds ordered this encounter  Medications  . albuterol (PROVENTIL HFA;VENTOLIN HFA) 108 (90 Base) MCG/ACT inhaler    Sig: Inhale 2 puffs into the lungs every 6 (six) hours as needed for wheezing or shortness of breath.    Dispense:  1 Inhaler    Refill:  1  . doxycycline (VIBRAMYCIN) 100 MG capsule    Sig: Take 1 capsule (100 mg total) by mouth 2 (two) times daily.    Dispense:  20 capsule    Refill:  0    See patient instructions for more details.     Signed Lamar Blinks, MD

## 2015-07-28 NOTE — Patient Instructions (Signed)
It was good to see you today- we are going to treat you with doxycycline twice a day for 10 days and the albuterol as needed Let me know if you are not feeling better in the next few days- Sooner if worse.

## 2015-09-16 ENCOUNTER — Other Ambulatory Visit: Payer: Self-pay | Admitting: Internal Medicine

## 2015-11-23 ENCOUNTER — Other Ambulatory Visit (INDEPENDENT_AMBULATORY_CARE_PROVIDER_SITE_OTHER): Payer: BLUE CROSS/BLUE SHIELD

## 2015-11-23 ENCOUNTER — Encounter: Payer: Self-pay | Admitting: Internal Medicine

## 2015-11-23 ENCOUNTER — Ambulatory Visit (INDEPENDENT_AMBULATORY_CARE_PROVIDER_SITE_OTHER): Payer: BLUE CROSS/BLUE SHIELD | Admitting: Internal Medicine

## 2015-11-23 VITALS — BP 116/78 | HR 67 | Temp 98.4°F | Resp 14 | Ht 67.0 in | Wt 176.1 lb

## 2015-11-23 DIAGNOSIS — R911 Solitary pulmonary nodule: Secondary | ICD-10-CM

## 2015-11-23 DIAGNOSIS — Z1159 Encounter for screening for other viral diseases: Secondary | ICD-10-CM

## 2015-11-23 DIAGNOSIS — Z418 Encounter for other procedures for purposes other than remedying health state: Secondary | ICD-10-CM | POA: Diagnosis not present

## 2015-11-23 DIAGNOSIS — Z0000 Encounter for general adult medical examination without abnormal findings: Secondary | ICD-10-CM

## 2015-11-23 DIAGNOSIS — Z23 Encounter for immunization: Secondary | ICD-10-CM | POA: Diagnosis not present

## 2015-11-23 DIAGNOSIS — I10 Essential (primary) hypertension: Secondary | ICD-10-CM

## 2015-11-23 DIAGNOSIS — Z299 Encounter for prophylactic measures, unspecified: Secondary | ICD-10-CM

## 2015-11-23 LAB — LIPID PANEL
CHOL/HDL RATIO: 4
Cholesterol: 227 mg/dL — ABNORMAL HIGH (ref 0–200)
HDL: 54.3 mg/dL (ref >39.00)
LDL CALC: 146 mg/dL — AB (ref 0–99)
NONHDL: 172.5
Triglycerides: 132 mg/dL (ref 0.0–149.0)
VLDL: 26.4 mg/dL (ref 0.0–40.0)

## 2015-11-23 LAB — COMPREHENSIVE METABOLIC PANEL
ALT: 15 U/L (ref 0–35)
AST: 17 U/L (ref 0–37)
Albumin: 4.6 g/dL (ref 3.5–5.2)
Alkaline Phosphatase: 65 U/L (ref 39–117)
BUN: 19 mg/dL (ref 6–23)
CHLORIDE: 102 meq/L (ref 96–112)
CO2: 29 meq/L (ref 19–32)
Calcium: 9.7 mg/dL (ref 8.4–10.5)
Creatinine, Ser: 0.84 mg/dL (ref 0.40–1.20)
GFR: 72.85 mL/min (ref >60.00)
GLUCOSE: 92 mg/dL (ref 70–99)
POTASSIUM: 4.3 meq/L (ref 3.5–5.1)
Sodium: 139 mEq/L (ref 135–145)
Total Bilirubin: 0.4 mg/dL (ref 0.2–1.2)
Total Protein: 7.2 g/dL (ref 6.0–8.3)

## 2015-11-23 NOTE — Patient Instructions (Signed)
We have given you the shingles shot today.   We are checking the blood work today and will send the results.   We will check the CT scan of the lung in July and see you back for the physical in the winter.

## 2015-11-23 NOTE — Assessment & Plan Note (Signed)
Likely incidental finding on CT cardiac risk last July. Checking CMP and CT chest this July for history of passive smoke as a child. If same or smaller needs no follow up further.

## 2015-11-23 NOTE — Progress Notes (Signed)
Pre visit review using our clinic review tool, if applicable. No additional management support is needed unless otherwise documented below in the visit note. 

## 2015-11-23 NOTE — Assessment & Plan Note (Signed)
Controlled on diltiazem and propranolol, checking CMP and adjust as needed.

## 2015-11-23 NOTE — Progress Notes (Signed)
   Subjective:    Patient ID: Katelyn [name], female    DOB: [birth year], 65 y.o.   MRN: [id number]  HPI The patient is a 65 YO female coming in for follow up of lung nodule (seen incidentally on CT cardiac risk score about 1 year ago, never smoker, exposure to smoke as a child) and follow up of her blood pressure (doing well on diltiazem and propranolol, not complicated).   Review of Systems  Constitutional: Negative for fever, activity change, appetite change, fatigue and unexpected weight change.  Respiratory: Negative for cough, chest tightness, shortness of breath and wheezing.   Cardiovascular: Negative for chest pain, palpitations and leg swelling.  Gastrointestinal: Negative for abdominal pain and abdominal distention.  Musculoskeletal: Negative for myalgias, back pain, arthralgias and gait problem.  Skin: Negative.   Neurological: Negative for dizziness, tremors, seizures, syncope, weakness, light-headedness and headaches.  Psychiatric/Behavioral: Negative.       Objective:   Physical Exam  Constitutional: She is oriented to person, place, and time. She appears well-developed and well-nourished.  Overweight  HENT:  Head: Normocephalic and atraumatic.  Eyes: EOM are normal.  Neck: Normal range of motion.  Cardiovascular: Normal rate and regular rhythm.   Pulmonary/Chest: Effort normal and breath sounds normal. No respiratory distress. She has no wheezes. She has no rales.  Abdominal: Soft. Bowel sounds are normal. She exhibits no distension. There is no tenderness. There is no rebound.  Musculoskeletal: She exhibits no edema.  Neurological: She is alert and oriented to person, place, and time. Coordination normal.  Skin: Skin is warm and dry.   Filed Vitals:   11/23/15 1311  BP: 116/78  Pulse: 67  Temp: 98.4 F (36.9 C)  TempSrc: Oral  Resp: 14  Height: 5\' 7"  (1.702 m)  Weight: 176 lb 1.9 oz (79.888 kg)  SpO2: 98%      Assessment & Plan:  Shingles shot given at  visit.

## 2015-11-24 LAB — HIV ANTIBODY (ROUTINE TESTING W REFLEX): HIV 1&2 Ab, 4th Generation: NONREACTIVE

## 2015-11-24 LAB — HEPATITIS C ANTIBODY: HCV AB: NEGATIVE

## 2015-12-07 ENCOUNTER — Encounter: Payer: Self-pay | Admitting: Internal Medicine

## 2016-01-27 ENCOUNTER — Other Ambulatory Visit: Payer: Self-pay | Admitting: Internal Medicine

## 2016-01-31 ENCOUNTER — Encounter: Payer: Self-pay | Admitting: Internal Medicine

## 2016-01-31 ENCOUNTER — Ambulatory Visit (INDEPENDENT_AMBULATORY_CARE_PROVIDER_SITE_OTHER)
Admission: RE | Admit: 2016-01-31 | Discharge: 2016-01-31 | Disposition: A | Discharge status: Home / Self Care | Payer: BLUE CROSS/BLUE SHIELD | Source: Ambulatory Visit | Attending: Internal Medicine | Admitting: Internal Medicine

## 2016-01-31 ENCOUNTER — Ambulatory Visit (INDEPENDENT_AMBULATORY_CARE_PROVIDER_SITE_OTHER): Payer: BLUE CROSS/BLUE SHIELD | Admitting: Internal Medicine

## 2016-01-31 VITALS — BP 108/70 | HR 52 | Ht 67.0 in | Wt 179.0 lb

## 2016-01-31 DIAGNOSIS — R911 Solitary pulmonary nodule: Secondary | ICD-10-CM

## 2016-01-31 DIAGNOSIS — I480 Paroxysmal atrial fibrillation: Secondary | ICD-10-CM | POA: Diagnosis not present

## 2016-01-31 NOTE — Progress Notes (Signed)
Cardiology Office Note   Date:  01/31/2016   ID:  Jenevie, Ruthven 04/29/1951, MRN [id number]  PCP:  Hoyt Koch, MD  Cardiologist:   Dorris Carnes, MD   F/U pf PAF and HTN      History of Present Illness: Katelyn [name] is a 65 y.o. female with a history of intermitt Afib  And HTN  I sasw her in Jan  Echo with normal LVEF and mod diastolic dysfunction    Since seen has had 3 migrainser/month on average  Did better with inderal 3x per day instead of 1   Dilt seems to have helped more with palpitatiosn    Noticed HR in 85s  CGot concerned  No dizzienss   No SOB  One episode of heart skiping  Lasted 15 min or so  No dizziness Eased off      Outpatient Medications Prior to Visit  Medication Sig Dispense Refill  . aspirin 325 MG EC tablet Take 325 mg by mouth daily.    Marland Kitchen diltiazem (CARDIZEM CD) 120 MG 24 hr capsule TAKE 1 CAPSULE (120 MG TOTAL) BY MOUTH DAILY. 90 capsule 2  . FLUoxetine (PROZAC) 20 MG capsule TAKE 2 CAPSULES (40 MG TOTAL) BY MOUTH DAILY. 180 capsule 2  . omeprazole (PRILOSEC OTC) 20 MG tablet Take 20 mg by mouth as needed (for heartburn). Reported on 11/23/2015    . SUMAtriptan (IMITREX) 100 MG tablet TAKE 1 TABLET BY MOUTH TWICE A DAY AS NEEDED FOR MIGRAINE 10 tablet 0  . Ferrous Sulfate (IRON) 325 (65 FE) MG TABS Take 325 mg by mouth 2 (two) times daily. (Patient taking differently: Take 325 mg by mouth daily. ) 60 each 3  . propranolol (INDERAL) 80 MG tablet TAKE 1 TABLET (80 MG TOTAL) BY MOUTH 3 (THREE) TIMES DAILY. 270 tablet 2   No facility-administered medications prior to visit.      Allergies:   Codeine; Lisinopril; and Morphine   Past Medical History:  Diagnosis Date  . Anemia   . Anxiety   . ANXIETY DEPRESSION 04/14/2009  . Atrial fibrillation ([place]) 04/14/2009  . GERD 09/07/2009  . HYPERLIPIDEMIA 09/07/2009  . HYPERTENSION 07/02/2009  . KNEE PAIN, RIGHT 12/07/2009  . MIGRAINE UNSP W/O INTRACT W/O STATUS MIGRAINOSUS 07/02/2009  .  Papilloma of right breast   . PONV (postoperative nausea and vomiting)   . Recurrent UTI     Past Surgical History:  Procedure Laterality Date  . ABDOMINAL HYSTERECTOMY  1997  . APPENDECTOMY  1964  . BREAST LUMPECTOMY WITH RADIOACTIVE SEED LOCALIZATION Right 04/09/2015   Procedure: RIGHT BREAST LUMPECTOMY WITH RADIOACTIVE SEED LOCALIZATION;  Surgeon: Alphonsa Overall, MD;  Location: [facility];  Service: General;  Laterality: Right;  . COLONOSCOPY N/A 03/26/2014   Procedure: COLONOSCOPY;  Surgeon: Milus Banister, MD;  [facility];  Service: Endoscopy;  Laterality: N/A;  . ESOPHAGOGASTRODUODENOSCOPY N/A 03/26/2014   Procedure: ESOPHAGOGASTRODUODENOSCOPY (EGD);  Surgeon: Milus Banister, MD;  Location: [facility];  Service: Endoscopy;  Laterality: N/A;  . Septorhinoplasty  1979     Social History:  The patient  reports that she has never smoked. She has never used smokeless tobacco. She reports that she drinks alcohol. She reports that she does not use drugs.   Family History:  The patient's family history includes Breast cancer in her mother; Hypertension in her father, mother, paternal grandfather, and paternal grandmother; Lung cancer in her father.    ROS:  Please see  the history of present illness. All other systems are reviewed and  Negative to the above problem except as noted.    PHYSICAL EXAM: VS:  BP 108/70   Pulse (!) 52   Ht 5\' 7"  (1.702 m)   Wt 179 lb (81.2 kg)   SpO2 97%   BMI 28.04 kg/m   GEN: Well nourished, well developed, in no acute distress  HEENT: normal  Neck: no JVD, carotid bruits, or masses Cardiac: RRR; no murmurs, rubs, or gallops,no edema  Respiratory:  clear to auscultation bilaterally, normal work of breathing GI: soft, nontender, nondistended, + BS  No hepatomegaly  MS: no deformity Moving all extremities   Skin: warm and dry, no rash Neuro:  Strength and sensation are intact Psych: euthymic mood, full affect   EKG:   EKG is ordered today. Sinus bradycardia  52 bpm  First degree AV block  PR interval 224 msec     Lipid Panel    Component Value Date/Time   CHOL 227 (H) 11/23/2015 1351   TRIG 132.0 11/23/2015 1351   HDL 54.30 11/23/2015 1351   CHOLHDL 4 11/23/2015 1351   VLDL 26.4 11/23/2015 1351   LDLCALC 146 (H) 11/23/2015 1351   LDLDIRECT 118.9 12/07/2009 0953      Wt Readings from Last 3 Encounters:  01/31/16 179 lb (81.2 kg)  11/23/15 176 lb 1.9 oz (79.9 kg)  07/28/15 191 lb (86.6 kg)      ASSESSMENT AND PLAN: 1 Rhythm Afib has never been demonstrated  Has fit bit  Would increase inderal to 80 bid   Follow HR and BP  2.  HTN  Folloow  3  HA  Follow with changes   4  CT  Repeat with right sided nodule 1 year ago   F/U in 10 to 12 months  Signed, Dorris Carnes, MD  01/31/2016 11:06 AM    [address], [place], [place]  [address] Phone: [phone]; Fax: [phone]

## 2016-01-31 NOTE — Patient Instructions (Addendum)
Your physician recommends that you continue on your current medications as directed. Please refer to the Current Medication list given to you today.  Non-Cardiac CT scanning, (CAT scanning), is a noninvasive, special x-ray that produces cross-sectional images of the body using x-rays and a computer. CT scans help physicians diagnose and treat medical conditions. For some CT exams, a contrast material is used to enhance visibility in the area of the body being studied. CT scans provide greater clarity and reveal more details than regular x-ray exams. PLEASE SCHEDULE CHEST CT SCAN BEFORE YOU LEAVE TODAY.  Your physician wants you to follow-up in: 10-12 months with Dr. Harrington Challenger.  You will receive a reminder letter in the mail two months in advance. If you don't receive a letter, please call our office to schedule the follow-up appointment.

## 2016-02-02 ENCOUNTER — Telehealth: Payer: Self-pay | Admitting: *Deleted

## 2016-02-02 DIAGNOSIS — R911 Solitary pulmonary nodule: Secondary | ICD-10-CM

## 2016-02-02 DIAGNOSIS — N289 Disorder of kidney and ureter, unspecified: Secondary | ICD-10-CM

## 2016-02-02 NOTE — Telephone Encounter (Signed)
-----   Message from Fay Records, MD sent at 02/01/2016 10:15 AM EDT ----- Spoke to patient about CT findings As per radiol recomm  1.  Noncontrast CT of chest in 3 months 2.  MRI with and without contrast in a dedicated renal protocol  (? If needs labs prior) Pt knows she will get a call to schedule these

## 2016-02-02 NOTE — Telephone Encounter (Signed)
Orders placed for MRI w/ and w/o for now as well as CT chest for in 3 months.  Will send to staff message [facility] to call and schedule these for patient.

## 2016-02-03 ENCOUNTER — Other Ambulatory Visit: Payer: Self-pay | Admitting: *Deleted

## 2016-02-03 DIAGNOSIS — N289 Disorder of kidney and ureter, unspecified: Secondary | ICD-10-CM

## 2016-02-04 ENCOUNTER — Other Ambulatory Visit: Payer: BLUE CROSS/BLUE SHIELD | Admitting: *Deleted

## 2016-02-04 DIAGNOSIS — N289 Disorder of kidney and ureter, unspecified: Secondary | ICD-10-CM

## 2016-02-04 LAB — BASIC METABOLIC PANEL
BUN: 13 mg/dL (ref 7–25)
CHLORIDE: 103 mmol/L (ref 98–110)
CO2: 28 mmol/L (ref 20–31)
CREATININE: 0.83 mg/dL (ref 0.50–0.99)
Calcium: 9.5 mg/dL (ref 8.6–10.4)
Glucose, Bld: 90 mg/dL (ref 65–99)
Potassium: 4.4 mmol/L (ref 3.5–5.3)
Sodium: 136 mmol/L (ref 135–146)

## 2016-02-09 ENCOUNTER — Ambulatory Visit (HOSPITAL_COMMUNITY)
Admission: RE | Admit: 2016-02-09 | Discharge: 2016-02-09 | Disposition: A | Discharge status: Home / Self Care | Payer: BLUE CROSS/BLUE SHIELD | Source: Ambulatory Visit | Attending: Internal Medicine | Admitting: Internal Medicine

## 2016-02-09 DIAGNOSIS — D734 Cyst of spleen: Secondary | ICD-10-CM | POA: Insufficient documentation

## 2016-02-09 DIAGNOSIS — K449 Diaphragmatic hernia without obstruction or gangrene: Secondary | ICD-10-CM | POA: Diagnosis not present

## 2016-02-09 DIAGNOSIS — N281 Cyst of kidney, acquired: Secondary | ICD-10-CM | POA: Diagnosis not present

## 2016-02-09 DIAGNOSIS — N289 Disorder of kidney and ureter, unspecified: Secondary | ICD-10-CM | POA: Diagnosis present

## 2016-02-09 MED ORDER — GADOBENATE DIMEGLUMINE 529 MG/ML IV SOLN
17.0000 mL | Freq: Once | INTRAVENOUS | Status: AC | PRN
  Administered 2016-02-09: 17 mL via INTRAVENOUS

## 2016-03-06 ENCOUNTER — Telehealth: Payer: Self-pay | Admitting: Internal Medicine

## 2016-03-06 ENCOUNTER — Telehealth: Payer: Self-pay | Admitting: *Deleted

## 2016-03-06 NOTE — Telephone Encounter (Signed)
Result Notes   Notes Recorded by Fay Records, MD on 02/11/2016 at 5:13 PM EDT Reviewed with radiology Renal cysts appear benign No f/u needed In regards to prevous CT they felt rel low risk lung nodules But they should be followed REcomm f/u in 6 not 3 months Pt notified Will have her called to make appt.       Attempted to reach patient to notify her that since Dr. Harrington Challenger would like her follow up CT scan (with out contrast) in 6 months instead of 3 months, we need to change her appointment.   Was unable to reach her.   No answer and voice mail not set up yet.    Will place note in order notes to reschedule and send message to [facility].

## 2016-03-06 NOTE — Telephone Encounter (Signed)
Routing to [place], [facility] who is calling patient to schedule future CT.

## 2016-03-06 NOTE — Telephone Encounter (Signed)
Follow Up:; ° ° °Returning your call. °

## 2016-03-27 ENCOUNTER — Other Ambulatory Visit: Payer: Self-pay | Admitting: Internal Medicine

## 2016-04-13 ENCOUNTER — Inpatient Hospital Stay (HOSPITAL_COMMUNITY)
Admission: RE | Admit: 2016-04-13 | Payer: BLUE CROSS/BLUE SHIELD | Source: Ambulatory Visit | Admitting: Nurse Practitioner

## 2016-05-08 ENCOUNTER — Other Ambulatory Visit: Payer: BLUE CROSS/BLUE SHIELD

## 2016-05-09 ENCOUNTER — Other Ambulatory Visit: Payer: BLUE CROSS/BLUE SHIELD

## 2016-05-23 ENCOUNTER — Ambulatory Visit: Payer: BLUE CROSS/BLUE SHIELD | Admitting: Internal Medicine

## 2016-06-19 ENCOUNTER — Ambulatory Visit: Payer: BLUE CROSS/BLUE SHIELD | Admitting: Internal Medicine

## 2016-06-21 ENCOUNTER — Other Ambulatory Visit: Payer: Self-pay | Admitting: Internal Medicine

## 2016-07-06 ENCOUNTER — Other Ambulatory Visit: Payer: Self-pay | Admitting: Internal Medicine

## 2016-07-19 ENCOUNTER — Encounter (HOSPITAL_COMMUNITY): Payer: Self-pay | Admitting: Nurse Practitioner

## 2016-07-19 ENCOUNTER — Ambulatory Visit (HOSPITAL_COMMUNITY)
Admission: RE | Admit: 2016-07-19 | Discharge: 2016-07-19 | Disposition: A | Discharge status: Home / Self Care | Payer: BLUE CROSS/BLUE SHIELD | Source: Ambulatory Visit | Attending: Nurse Practitioner | Admitting: Nurse Practitioner

## 2016-07-19 VITALS — BP 154/96 | HR 56 | Ht 67.0 in | Wt 196.8 lb

## 2016-07-19 DIAGNOSIS — E785 Hyperlipidemia, unspecified: Secondary | ICD-10-CM | POA: Diagnosis not present

## 2016-07-19 DIAGNOSIS — I10 Essential (primary) hypertension: Secondary | ICD-10-CM | POA: Diagnosis not present

## 2016-07-19 DIAGNOSIS — Z803 Family history of malignant neoplasm of breast: Secondary | ICD-10-CM | POA: Insufficient documentation

## 2016-07-19 DIAGNOSIS — F329 Major depressive disorder, single episode, unspecified: Secondary | ICD-10-CM | POA: Insufficient documentation

## 2016-07-19 DIAGNOSIS — Z888 Allergy status to other drugs, medicaments and biological substances status: Secondary | ICD-10-CM | POA: Diagnosis not present

## 2016-07-19 DIAGNOSIS — Z8249 Family history of ischemic heart disease and other diseases of the circulatory system: Secondary | ICD-10-CM | POA: Diagnosis not present

## 2016-07-19 DIAGNOSIS — Z801 Family history of malignant neoplasm of trachea, bronchus and lung: Secondary | ICD-10-CM | POA: Diagnosis not present

## 2016-07-19 DIAGNOSIS — Z9889 Other specified postprocedural states: Secondary | ICD-10-CM | POA: Insufficient documentation

## 2016-07-19 DIAGNOSIS — R002 Palpitations: Secondary | ICD-10-CM | POA: Insufficient documentation

## 2016-07-19 DIAGNOSIS — F419 Anxiety disorder, unspecified: Secondary | ICD-10-CM | POA: Insufficient documentation

## 2016-07-19 DIAGNOSIS — Z79899 Other long term (current) drug therapy: Secondary | ICD-10-CM | POA: Insufficient documentation

## 2016-07-19 DIAGNOSIS — Z7982 Long term (current) use of aspirin: Secondary | ICD-10-CM | POA: Diagnosis not present

## 2016-07-19 DIAGNOSIS — Z87440 Personal history of urinary (tract) infections: Secondary | ICD-10-CM | POA: Insufficient documentation

## 2016-07-19 DIAGNOSIS — K219 Gastro-esophageal reflux disease without esophagitis: Secondary | ICD-10-CM | POA: Insufficient documentation

## 2016-07-19 HISTORY — DX: Palpitations: R00.2

## 2016-07-19 NOTE — Progress Notes (Signed)
Primary Care Physician: Hoyt Koch, MD Primary Cardiologist: Harrington Challenger Primary Electrophysiologist: Caryl Comes Referring Physician: Melody Comas Katelyn [name] is a 66 y.o. female with a history of palpitations. She has a several year history of palpitations with no arrhythmias documented. Her episodes occur intermittently and last minutes to hours.  She is currently taking diltiazem and inderal with some improvement.  She has seen Dr Caryl Comes in the past (2010).  She is not sure why she is here today.   Today, she denies symptoms of chest pain, shortness of breath, orthopnea, PND, lower extremity edema, dizziness, presyncope, syncope, snoring, daytime somnolence, bleeding, or neurologic sequela. The patient is tolerating medications without difficulties and is otherwise without complaint today.    Atrial Fibrillation Risk Factors:  she does not have symptoms or diagnosis of sleep apnea.  she does not have a history of rheumatic fever.  she does not have a history of alcohol use.  she has a BMI of Body mass index is 30.82 kg/m.Marland Kitchen Filed Weights   07/19/16 1526  Weight: 196 lb 12.8 oz (89.3 kg)    LA size: 42   Past Medical History:  Diagnosis Date  . Anemia   . ANXIETY DEPRESSION 04/14/2009  . GERD 09/07/2009  . HYPERLIPIDEMIA 09/07/2009  . HYPERTENSION 07/02/2009  . KNEE PAIN, RIGHT 12/07/2009  . MIGRAINE UNSP W/O INTRACT W/O STATUS MIGRAINOSUS 07/02/2009  . Palpitations    a. never documented arrhythmias  . Papilloma of right breast   . PONV (postoperative nausea and vomiting)   . Recurrent UTI    Past Surgical History:  Procedure Laterality Date  . ABDOMINAL HYSTERECTOMY  1997  . APPENDECTOMY  1964  . BREAST LUMPECTOMY WITH RADIOACTIVE SEED LOCALIZATION Right 04/09/2015   Procedure: RIGHT BREAST LUMPECTOMY WITH RADIOACTIVE SEED LOCALIZATION;  Surgeon: Alphonsa Overall, MD;  Location: [place];  Service: General;  Laterality: Right;  . COLONOSCOPY N/A  03/26/2014   Procedure: COLONOSCOPY;  Surgeon: Milus Banister, MD;  Location: [facility];  Service: Endoscopy;  Laterality: N/A;  . ESOPHAGOGASTRODUODENOSCOPY N/A 03/26/2014   Procedure: ESOPHAGOGASTRODUODENOSCOPY (EGD);  Surgeon: Milus Banister, MD;  Location: [facility];  Service: Endoscopy;  Laterality: N/A;  . Septorhinoplasty  1979    Current Outpatient Prescriptions  Medication Sig Dispense Refill  . aspirin 325 MG EC tablet Take 325 mg by mouth daily.    Marland Kitchen diltiazem (CARDIZEM CD) 120 MG 24 hr capsule TAKE ONE CAPSULE BY MOUTH EVERY DAY 90 capsule 1  . ferrous sulfate 325 (65 FE) MG tablet Take 325 mg by mouth daily.    Marland Kitchen FLUoxetine (PROZAC) 20 MG capsule Take 2 capsules (40 mg total) by mouth daily. 180 capsule 1  . omeprazole (PRILOSEC OTC) 20 MG tablet Take 20 mg by mouth as needed (for heartburn). Reported on 11/23/2015    . propranolol (INDERAL) 80 MG tablet Take 80 mg by mouth 3 (three) times daily.     . SUMAtriptan (IMITREX) 100 MG tablet TAKE 1 TABLET BY MOUTH TWICE A DAY AS NEEDED FOR MIGRAINE 10 tablet 0   No current facility-administered medications for this encounter.     Allergies  Allergen Reactions  . Codeine Nausea And Vomiting  . Lisinopril Cough  . Morphine Hives, Itching and Nausea And Vomiting    Social History   Social History  . Marital status: Divorced    Spouse name: N/A  . Number of children: 1  . Years of education: N/A   Occupational  History  . RN [employer]   Social History Main Topics  . Smoking status: Never Smoker  . Smokeless tobacco: Never Used  . Alcohol use 0.0 oz/week     Comment: social occasional  . Drug use: No  . Sexual activity: No   Other Topics Concern  . Not on file   Social History Narrative   Previously worked at [employer], has been stressed by 12 years of separation with husband who recently stopped paying alimony.  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      Family History    Problem Relation Age of Onset  . Hypertension Mother   . Breast cancer Mother   . Hypertension Father   . Lung cancer Father     textiles and smoker  . Hypertension Paternal Grandmother   . Hypertension Paternal Grandfather    ROS- All systems are reviewed and negative except as per the HPI above.  Physical Exam: Vitals:   07/19/16 1526  BP: (!) 154/96  Pulse: (!) 56  Weight: 196 lb 12.8 oz (89.3 kg)  Height: 5\' 7"  (1.702 m)    GEN- The patient is well appearing, alert and oriented x 3 today.   Head- normocephalic, atraumatic Eyes-  Sclera clear, conjunctiva pink Ears- hearing intact Oropharynx- clear Neck- supple  Lungs- Clear to ausculation bilaterally, normal work of breathing Heart- Regular rate and rhythm  GI- soft, NT, ND, + BS Extremities- no clubbing, cyanosis, or edema MS- no significant deformity or atrophy Skin- no rash or lesion Psych- euthymic mood, full affect Neuro- strength and sensation are intact  Wt Readings from Last 3 Encounters:  07/19/16 196 lb 12.8 oz (89.3 kg)  01/31/16 179 lb (81.2 kg)  11/23/15 176 lb 1.9 oz (79.9 kg)    EKG today demonstrates sinus rhythm, LVH, 1st degree AV block, PR 231msec Echo 12/2014 demonstrated EF 60-65%, no RWMA, mild MR  Epic records are reviewed at length today  Assessment and Plan:  1. Palpitations The patient has symptomatic palpitations but has never had arrhythmias documented We discussed today different monitoring options. She is interested in [employer] and will work on purchasing to try to diagnose arrhythmia We have discussed that as she does not have documented atrial fibrillation, there is no need for change in therapy at this time  2.  Anxiety Continue follow up with PCP    3. HTN BP elevated in clinic today in the setting of anxiety Follow at home  Follow up with AF clinic as needed. Follow up with Dr Harrington Challenger as scheduled.    Chanetta Marshall, NP 07/19/2016 3:54 PM

## 2016-07-20 ENCOUNTER — Other Ambulatory Visit: Payer: Self-pay | Admitting: Internal Medicine

## 2016-07-20 DIAGNOSIS — Z1231 Encounter for screening mammogram for malignant neoplasm of breast: Secondary | ICD-10-CM

## 2016-07-21 ENCOUNTER — Ambulatory Visit
Admission: RE | Admit: 2016-07-21 | Discharge: 2016-07-21 | Disposition: A | Discharge status: Home / Self Care | Payer: BLUE CROSS/BLUE SHIELD | Source: Ambulatory Visit | Attending: Internal Medicine | Admitting: Internal Medicine

## 2016-07-21 DIAGNOSIS — Z1231 Encounter for screening mammogram for malignant neoplasm of breast: Secondary | ICD-10-CM

## 2016-07-24 ENCOUNTER — Encounter: Payer: Self-pay | Admitting: Internal Medicine

## 2016-07-24 ENCOUNTER — Ambulatory Visit (INDEPENDENT_AMBULATORY_CARE_PROVIDER_SITE_OTHER)
Admission: RE | Admit: 2016-07-24 | Discharge: 2016-07-24 | Disposition: A | Discharge status: Home / Self Care | Payer: BLUE CROSS/BLUE SHIELD | Source: Ambulatory Visit | Attending: Internal Medicine | Admitting: Internal Medicine

## 2016-07-24 ENCOUNTER — Ambulatory Visit (INDEPENDENT_AMBULATORY_CARE_PROVIDER_SITE_OTHER): Payer: BLUE CROSS/BLUE SHIELD | Admitting: Internal Medicine

## 2016-07-24 ENCOUNTER — Other Ambulatory Visit: Payer: Self-pay | Admitting: Internal Medicine

## 2016-07-24 VITALS — BP 140/76 | HR 65 | Temp 98.5°F | Ht 67.0 in | Wt 196.0 lb

## 2016-07-24 DIAGNOSIS — G43B0 Ophthalmoplegic migraine, not intractable: Secondary | ICD-10-CM

## 2016-07-24 DIAGNOSIS — R911 Solitary pulmonary nodule: Secondary | ICD-10-CM | POA: Diagnosis not present

## 2016-07-24 DIAGNOSIS — N6459 Other signs and symptoms in breast: Secondary | ICD-10-CM

## 2016-07-24 DIAGNOSIS — Z0000 Encounter for general adult medical examination without abnormal findings: Secondary | ICD-10-CM | POA: Diagnosis not present

## 2016-07-24 DIAGNOSIS — I10 Essential (primary) hypertension: Secondary | ICD-10-CM | POA: Diagnosis not present

## 2016-07-24 NOTE — Progress Notes (Signed)
   Subjective:    Patient ID: Katelyn [name], female    DOB: Apr 19, 1951, 66 y.o.   MRN: [id number]  HPI The patient is a 66 YO female coming in for wellness. No new concerns. Back on propranolol for her palpitations.   PMH, [facility], social history reviewed and updated.   Review of Systems  Constitutional: Negative.   HENT: Negative.   Eyes: Negative.   Respiratory: Negative for cough, chest tightness and shortness of breath.   Cardiovascular: Negative for chest pain, palpitations and leg swelling.  Gastrointestinal: Negative for abdominal distention, abdominal pain, constipation, diarrhea, nausea and vomiting.  Musculoskeletal: Negative.   Skin: Negative.   Neurological: Negative.   Psychiatric/Behavioral: Negative.       Objective:   Physical Exam  Constitutional: She is oriented to person, place, and time. She appears well-developed and well-nourished.  HENT:  Head: Normocephalic and atraumatic.  Eyes: EOM are normal.  Neck: Normal range of motion.  Cardiovascular: Normal rate and regular rhythm.   Pulmonary/Chest: Effort normal and breath sounds normal. No respiratory distress. She has no wheezes. She has no rales.  Abdominal: Soft. Bowel sounds are normal. She exhibits no distension. There is no tenderness. There is no rebound.  Musculoskeletal: She exhibits no edema.  Neurological: She is alert and oriented to person, place, and time. Coordination normal.  Skin: Skin is warm and dry.  Psychiatric: She has a normal mood and affect.   Vitals:   07/24/16 1300  BP: 140/76  Pulse: 65  Temp: 98.5 F (36.9 C)  TempSrc: Oral  SpO2: 98%  Weight: 196 lb (88.9 kg)  Height: 5\' 7"  (1.702 m)      Assessment & Plan:

## 2016-07-24 NOTE — Patient Instructions (Signed)
We will put in the labs and you can get them done at your convenience.

## 2016-07-24 NOTE — Progress Notes (Signed)
Pre visit review using our clinic review tool, if applicable. No additional management support is needed unless otherwise documented below in the visit note. 

## 2016-07-26 ENCOUNTER — Encounter: Payer: Self-pay | Admitting: Internal Medicine

## 2016-07-26 DIAGNOSIS — Z0000 Encounter for general adult medical examination without abnormal findings: Secondary | ICD-10-CM | POA: Insufficient documentation

## 2016-07-26 NOTE — Assessment & Plan Note (Signed)
Reviewed imaging from day of visit with her and she does need follow up for the ground glass lesions in 2020 February.

## 2016-07-26 NOTE — Assessment & Plan Note (Signed)
Up to date on colonoscopy, mammogram. No indication for pap smear. Flu shot up to date. Tetanus and shingles up to date. Hep c screening done and negative. Counseled about sun safety and mole surveillance as well as dangers of distracted driving. Given screening recommendations.

## 2016-07-26 NOTE — Assessment & Plan Note (Signed)
BP at goal on propranolol and diltiazem. Checking CMP and adjust as needed.

## 2016-07-26 NOTE — Assessment & Plan Note (Signed)
Using sumatriptan as needed.

## 2016-08-03 ENCOUNTER — Ambulatory Visit
Admission: RE | Admit: 2016-08-03 | Discharge: 2016-08-03 | Disposition: A | Discharge status: Home / Self Care | Payer: BLUE CROSS/BLUE SHIELD | Source: Ambulatory Visit | Attending: Internal Medicine | Admitting: Internal Medicine

## 2016-08-03 DIAGNOSIS — N6459 Other signs and symptoms in breast: Secondary | ICD-10-CM

## 2016-09-17 ENCOUNTER — Encounter: Payer: Self-pay | Admitting: Internal Medicine

## 2016-09-19 NOTE — Telephone Encounter (Signed)
Pt should be on a statin as LDL is 146 and she has aortic atherosclerosis.  Would start Crestor 20   F/U lipids in 8 wks with AST

## 2016-09-21 ENCOUNTER — Other Ambulatory Visit: Payer: Self-pay | Admitting: *Deleted

## 2016-09-21 DIAGNOSIS — I700 Atherosclerosis of aorta: Secondary | ICD-10-CM

## 2016-09-21 MED ORDER — ROSUVASTATIN CALCIUM 20 MG PO TABS: 20.0000 mg | ORAL_TABLET | Freq: Every day | ORAL | 3 refills | Status: DC

## 2016-09-21 NOTE — Progress Notes (Signed)
Fay Records, MD      1:29 PM  Note    Pt should be on a statin as LDL is 146 and she has aortic atherosclerosis.  Would start Crestor 20   F/U lipids in 8 wks with AST       September 17, 2016  Katelyn [name]  to Fay Records, MD       3:08 PM   To Dr. Alan Ripper nurse:   You had contacted me regarding beginning a cholesterol medication. I am ready to begin taking the medication now.   Please contact [facility] at [phone] to add a script.   Thank you very much,  Katelyn [name]

## 2016-09-26 ENCOUNTER — Other Ambulatory Visit: Payer: Self-pay | Admitting: Internal Medicine

## 2016-09-26 NOTE — Telephone Encounter (Signed)
Please advise as PCP does not prescribe one of these medications

## 2016-10-24 IMAGING — CR DG CHEST 2V
2 series · 2 of 2 positions shown · non-contrast
Comparison: Prior study from 12/06/2013

CLINICAL DATA: Initial evaluation for acute palpitations, dyspnea.

EXAM:
CHEST  2 VIEW

[w chest pa]
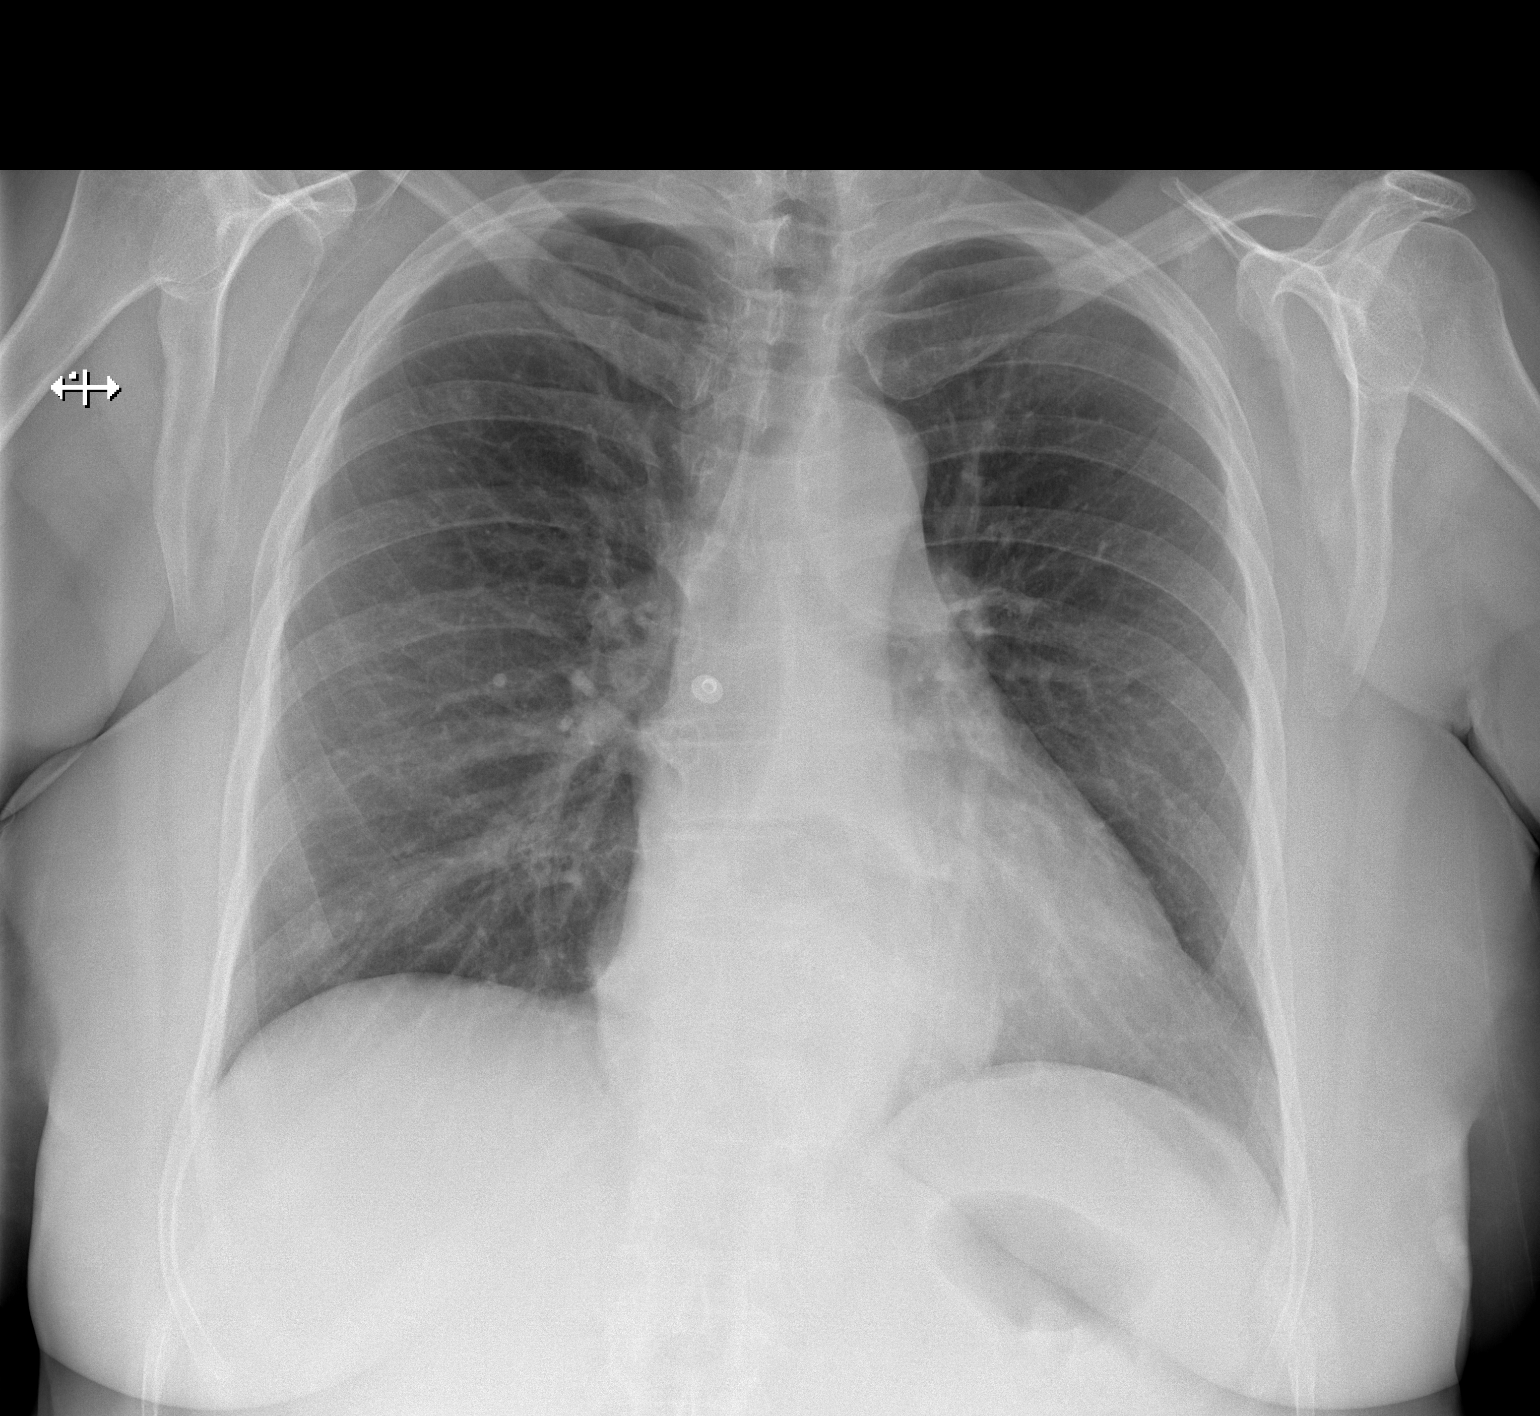

[w chest lat]
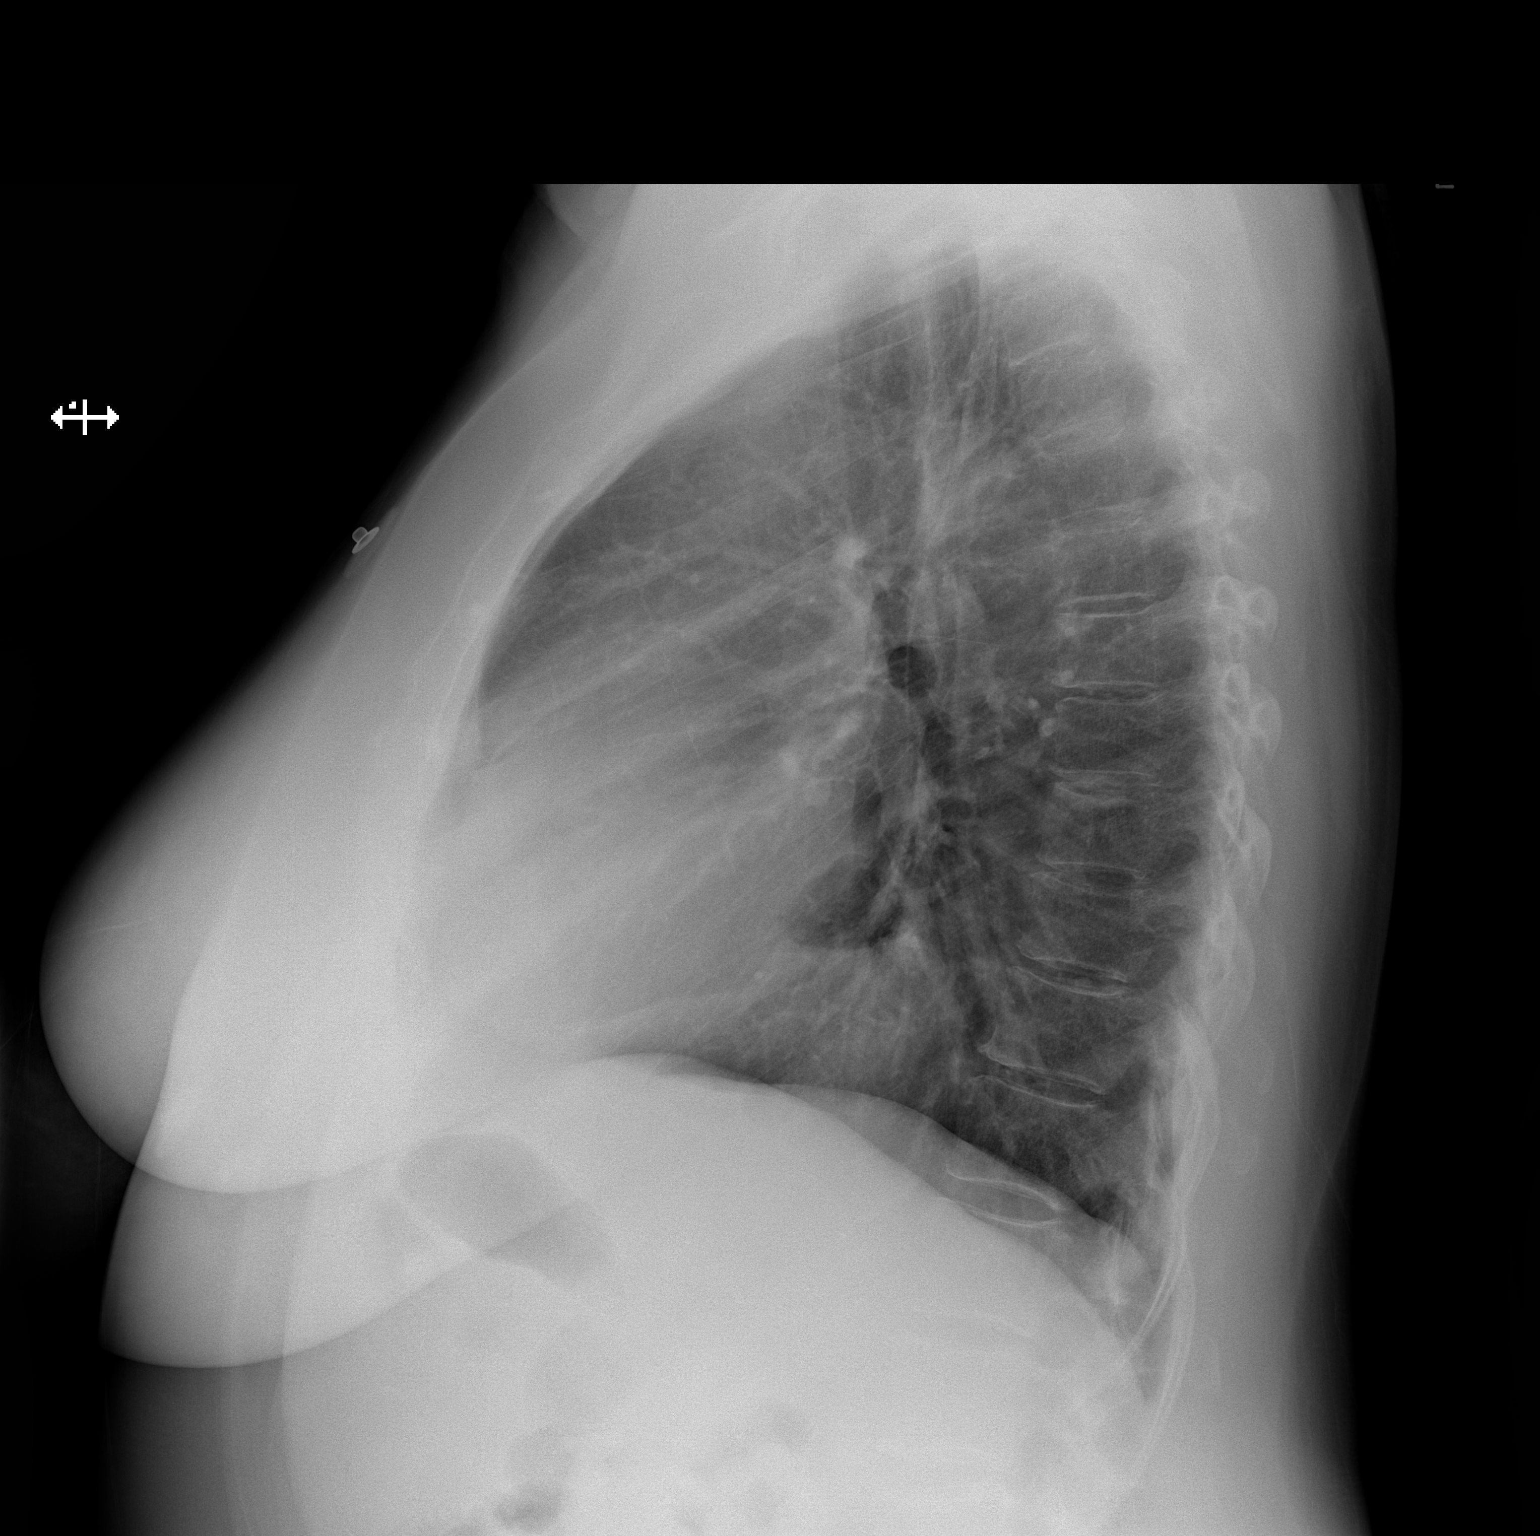

[2 of 2 positions shown; findings below may reference images not displayed]

FINDINGS: Cardiomegaly is stable. Mediastinal silhouette within normal limits.
Hiatal hernia noted.

The lungs are normally inflated. No airspace consolidation, pleural
effusion, or pulmonary edema is identified. There is no
pneumothorax.

No acute osseous abnormality identified.
IMPRESSION: 1. No active cardiopulmonary disease.
2. Stable cardiomegaly.
3. Hiatal hernia.

## 2016-11-19 IMAGING — CT CT HEART SCORING
1 of 3 series · 10 of 20 positions shown, 13 images · non-contrast
Comparison: None.

CLINICAL DATA: Risk stratification

EXAM:
Coronary Calcium Score
TECHNIQUE: The patient was scanned on a Siemens Sensation 16 slice scanner.
Axial non-contrast 3mm slices were carried out through the heart.
The data set was analyzed on a dedicated work station and scored
using the Agatson method.

[Series 6: st thins for reformat · axial · 0.67mm/px · z∈[-226,-133]mm · 10 of 115 slices shown, 13 images]
[im 11/115  vessel]
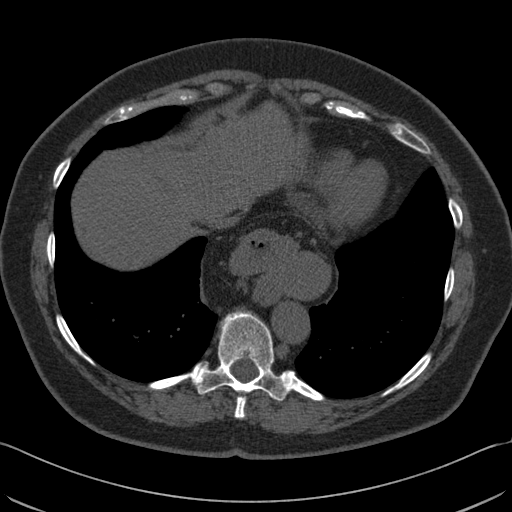
[im 11/115  lung]
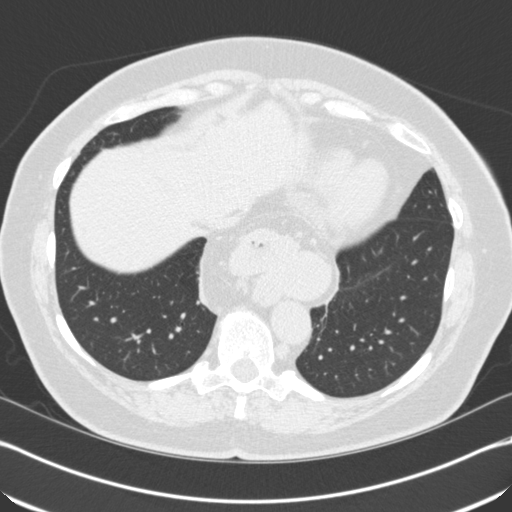
[im 21/115  vessel]
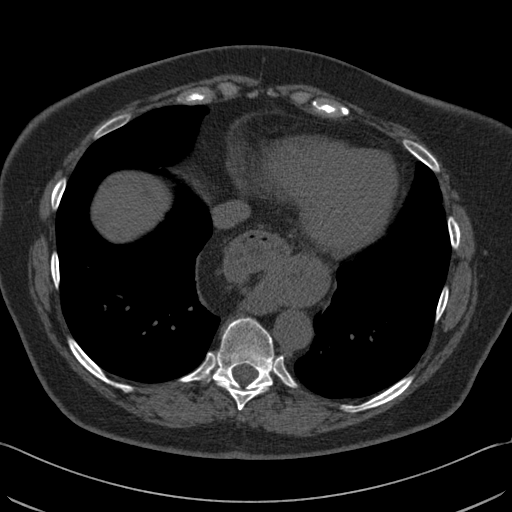
[im 32/115  vessel]
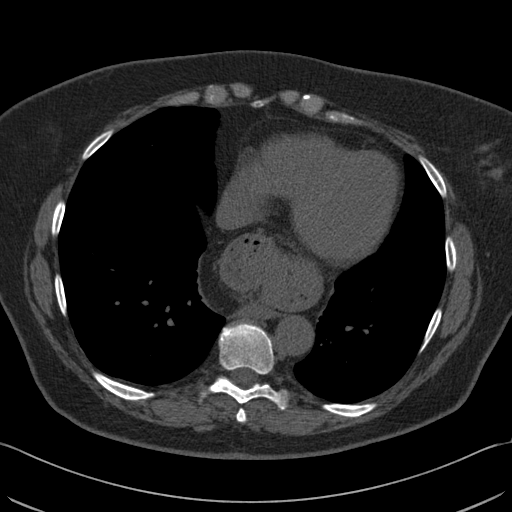
[im 42/115  vessel]
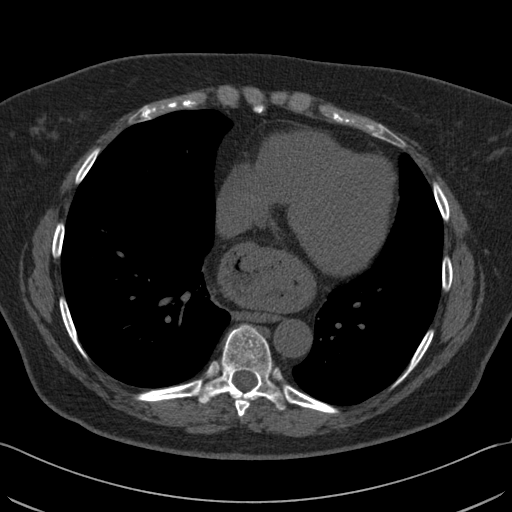
[im 52/115  vessel]
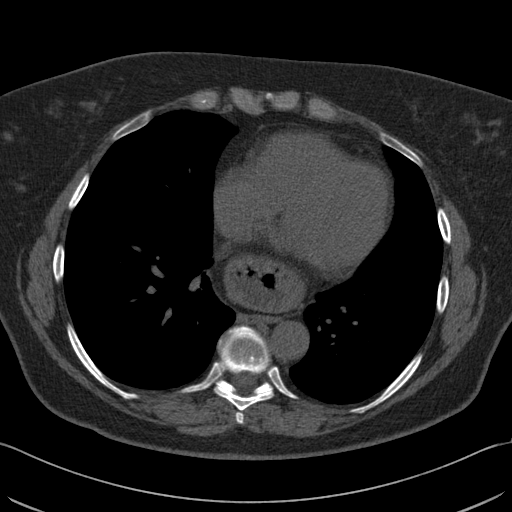
[im 52/115  lung]
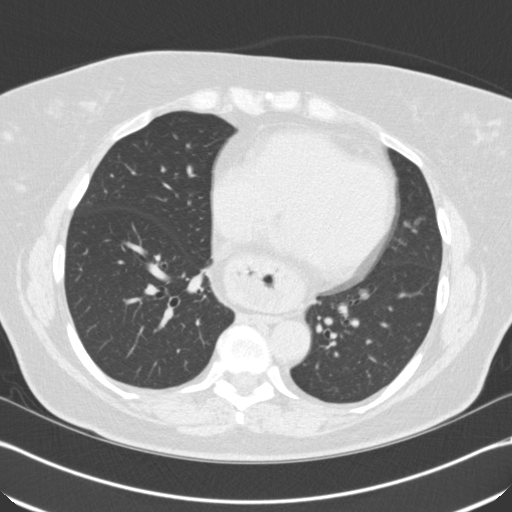
[im 63/115  vessel]
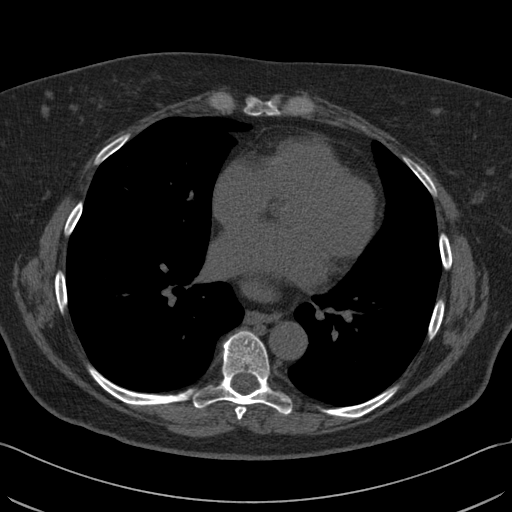
[im 73/115  vessel]
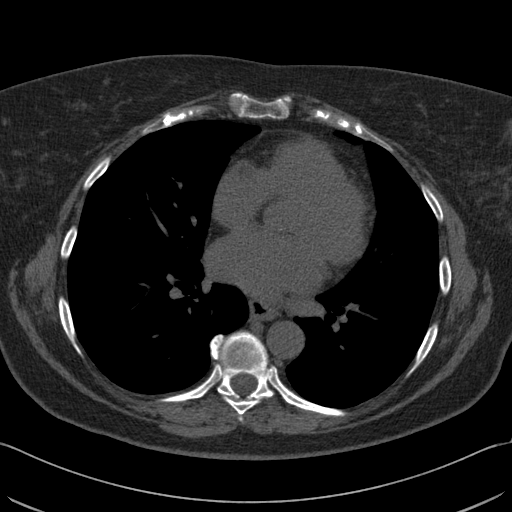
[im 83/115  vessel]
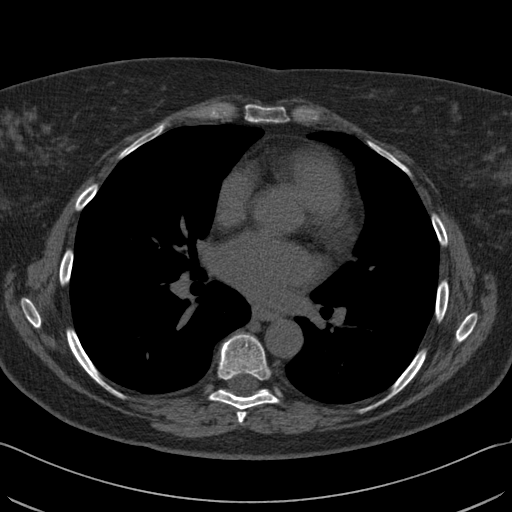
[im 94/115  vessel]
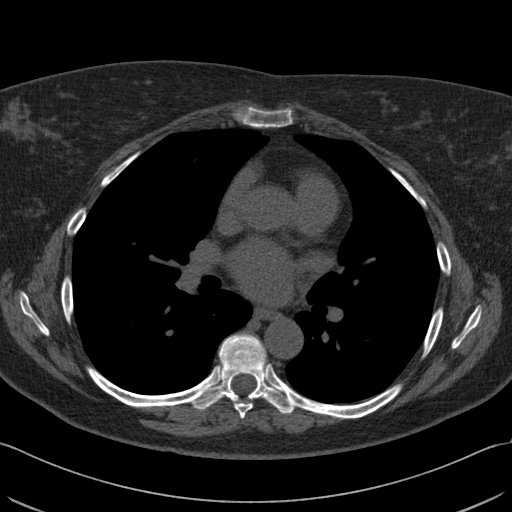
[im 94/115  lung]
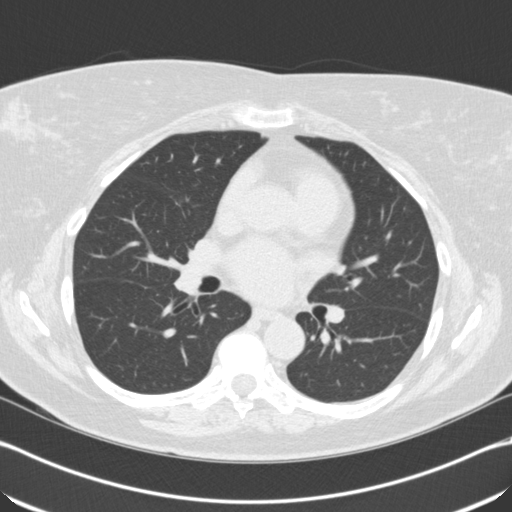
[im 104/115  vessel]
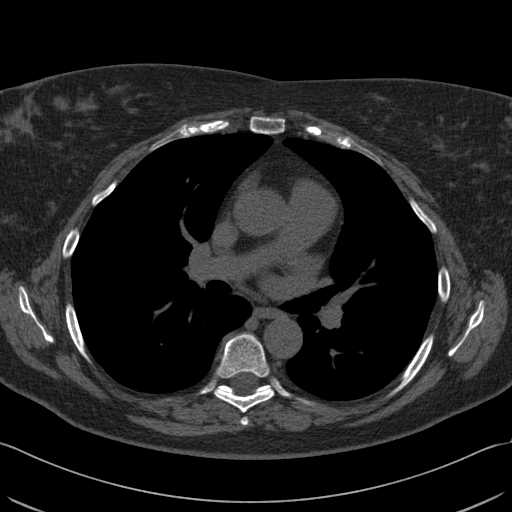

[10 of 20 positions shown; findings below may reference images not displayed]

FINDINGS: Non-cardiac: RML lung nodule and large hiatal hernia on limited lung
and soft tissue windows. See separate report from [REDACTED].

Ascending Aorta:  3.5 cm

Pericardium: Normal

Coronary arteries:  No coronary calcium seen
IMPRESSION: Coronary calcium score of 0.

Quirijn Amazigh

EXAM:
OVER-READ INTERPRETATION  CT CHEST

The following report is an over-read performed by radiologist Dr.
Peter-Gay Santhosh [REDACTED] on 12/11/2014. This over-read
does not include interpretation of cardiac or coronary anatomy or
pathology. The coronary calcium score/coronary CTA interpretation by
the cardiologist is attached.
FINDINGS: 3 mm right middle lobe pulmonary nodule is seen on image 17 series
for. No evidence for lymphadenopathy within the visualized
mediastinum or either hilum. No pericardial effusion. Large hiatal
hernia noted with configuration consistent with organoaxial
volvulus. No gastric distention to suggest obstruction at this time.
IMPRESSION: 3 mm right middle lobe pulmonary nodule. If the patient is at high
risk for bronchogenic carcinoma, follow-up chest CT at 1 year is
recommended. If the patient is at low risk, no follow-up is needed.
This recommendation follows the consensus statement: Guidelines for
Management of Small Pulmonary Nodules Detected on CT Scans: A
Statement from the [HOSPITAL] as published in Radiology

Large hiatal hernia.

## 2016-12-22 ENCOUNTER — Other Ambulatory Visit: Payer: Self-pay | Admitting: Internal Medicine

## 2017-01-13 ENCOUNTER — Other Ambulatory Visit: Payer: Self-pay | Admitting: Internal Medicine

## 2017-01-24 ENCOUNTER — Telehealth: Payer: Self-pay | Admitting: *Deleted

## 2017-01-24 DIAGNOSIS — E785 Hyperlipidemia, unspecified: Secondary | ICD-10-CM

## 2017-01-24 NOTE — Telephone Encounter (Signed)
-----   Message from Rodman Key, RN sent at 10/05/2016 12:18 PM EDT ----- Regarding: lipids Started crestor 4/12.  Needs lipids in 2 months.

## 2017-01-24 NOTE — Telephone Encounter (Signed)
Spoke with patient.  Started Crestor in April.  Scheduled repeat lipids for 8/21 at our office.  Pt is aware to come fasting.

## 2017-01-30 ENCOUNTER — Other Ambulatory Visit: Payer: Self-pay | Admitting: *Deleted

## 2017-01-30 DIAGNOSIS — E785 Hyperlipidemia, unspecified: Secondary | ICD-10-CM

## 2017-01-30 LAB — LIPID PANEL
CHOL/HDL RATIO: 2.6 ratio (ref 0.0–4.4)
Cholesterol, Total: 154 mg/dL (ref 100–199)
HDL: 60 mg/dL (ref >39)
LDL CALC: 73 mg/dL (ref 0–99)
Triglycerides: 107 mg/dL (ref 0–149)
VLDL CHOLESTEROL CAL: 21 mg/dL (ref 5–40)

## 2017-06-21 ENCOUNTER — Other Ambulatory Visit: Payer: Self-pay | Admitting: Family

## 2017-06-29 ENCOUNTER — Other Ambulatory Visit: Payer: Self-pay

## 2017-06-29 MED ORDER — DILTIAZEM HCL ER COATED BEADS 120 MG PO CP24: 120.0000 mg | ORAL_CAPSULE | Freq: Every day | ORAL | 0 refills | Status: DC

## 2017-09-27 ENCOUNTER — Other Ambulatory Visit: Payer: Self-pay | Admitting: Internal Medicine

## 2017-10-11 ENCOUNTER — Other Ambulatory Visit: Payer: Self-pay | Admitting: Internal Medicine

## 2019-09-24 ENCOUNTER — Encounter: Payer: Self-pay | Admitting: Internal Medicine

## 2019-10-24 ENCOUNTER — Telehealth: Payer: Self-pay | Admitting: Internal Medicine

## 2019-10-24 DIAGNOSIS — I480 Paroxysmal atrial fibrillation: Secondary | ICD-10-CM

## 2019-10-24 DIAGNOSIS — R002 Palpitations: Secondary | ICD-10-CM

## 2019-10-24 NOTE — Telephone Encounter (Signed)
   Pt is calling to schedule appt with Dr. Harrington Challenger advised its been more than 3 years she was last seen and to re-establish care she will be new pt and need to have referral. Pt decline and she claims Dr. Harrington Challenger sent her a letter and wanted to see her. [place] to speak with Dr. Harrington Challenger or nurse  Please advise

## 2019-10-24 NOTE — Telephone Encounter (Signed)
Left message for patient to call back and sent MyChart message offering her an appointment July 2 at 3:00 pm.

## 2019-10-30 ENCOUNTER — Telehealth: Payer: Self-pay | Admitting: Radiology

## 2019-10-30 NOTE — Telephone Encounter (Addendum)
Patients information needs updating its been a few years since they have been seen. I was unable to reach patient to verify address and insurance. Will try again before ordering monitor

## 2019-10-31 NOTE — Telephone Encounter (Signed)
Enrolled patient for a 14 day Zio monitor to be mailed to patients home. Brief instructions were gone over with the patient and she knows to expect the monitor to arrive in 4-5 days.

## 2019-11-04 ENCOUNTER — Other Ambulatory Visit (INDEPENDENT_AMBULATORY_CARE_PROVIDER_SITE_OTHER): Payer: Medicare Other

## 2019-11-04 DIAGNOSIS — R002 Palpitations: Secondary | ICD-10-CM | POA: Diagnosis not present

## 2019-11-04 DIAGNOSIS — I480 Paroxysmal atrial fibrillation: Secondary | ICD-10-CM | POA: Diagnosis not present

## 2019-11-25 NOTE — Progress Notes (Signed)
Cardiology Office Note   Date:  11/27/2019   ID:  Katelyn [name], DOB 1950/08/22, MRN [id number]  PCP:  Hoyt Koch, MD  Cardiologist:   Dorris Carnes, MD   F/U pf PAF and HTN      History of Present Illness: Katelyn [name] is a 69 y.o. female with a history of intermitt Afib migraines and HTN    Echo showed normal  LVEF and mod diastolic dysfunction  The pt was last in cardiology clinc (afib ) back in 2018   I saw her before that    She has ben [facility]   She is now moving back to [place]  The pt called in earlier and noted palpittations.   She just turned in an event monitor (results not back)   She does have an apple watch and had been able to capture what she thinks is afib   She has review anticoagulation recommendatoins and is convcerned because she has been anemic in past   The pt notes palpitatons as noted   No CP   Patient says she has 2 t o3 per month  Some months not at all When has spells feels pounding   HR 140s    No dizziness  Outpatient Medications Prior to Visit  Medication Sig Dispense Refill  . aspirin 325 MG EC tablet Take 325 mg by mouth daily.    . busPIRone (BUSPAR) 10 MG tablet Take 1 tablet by mouth 3 (three) times daily.    Marland Kitchen conjugated estrogens (PREMARIN) vaginal cream Apply 0.5 grams intravaginally twice per week.    . diltiazem (CARDIZEM CD) 120 MG 24 hr capsule Take 1 capsule (120 mg total) by mouth daily. Need annual visit for further refills 90 capsule 0  . ferrous sulfate 325 (65 FE) MG tablet Take 325 mg by mouth daily.    Marland Kitchen gabapentin (NEURONTIN) 300 MG capsule TAKE 1 CAPSULE BY MOUTH AT BEDTIME FOR 3 DAYS THEN TAKE 1 CAPSULE BY MOUTH TWICE DAILY FOR 3 DAYS THEN 1 CAPSULE THREE TIMES DAILY    . montelukast (SINGULAIR) 10 MG tablet Take 10 mg by mouth at bedtime.    . rosuvastatin (CRESTOR) 20 MG tablet Take 1 tablet (20 mg total) by mouth daily. 90 tablet 3  . SUMAtriptan (IMITREX) 100 MG tablet TAKE 1 TABLET  BY MOUTH TWICE A DAY AS NEEDED FOR MIGRAINE 10 tablet 0  . atorvastatin (LIPITOR) 20 MG tablet Take 20 mg by mouth daily.      . benazepril (LOTENSIN) 20 MG tablet Take 20 mg by mouth daily.      Marland Kitchen FLUoxetine (PROZAC) 20 MG capsule TAKE 2 CAPSULES (40 MG TOTAL) BY MOUTH DAILY. (Patient not taking: Reported on 11/26/2019) 90 capsule 0  . omeprazole (PRILOSEC OTC) 20 MG tablet Take 20 mg by mouth as needed (for heartburn). Reported on 11/23/2015 (Patient not taking: Reported on 11/26/2019)    . propranolol (INDERAL) 80 MG tablet Take 80 mg by mouth 3 (three) times daily.  (Patient not taking: Reported on 11/26/2019)    . valsartan-hydrochlorothiazide (DIOVAN-HCT) 160-12.5 MG per tablet Take 1 tablet by mouth daily.      . Venlafaxine HCl 225 MG TB24 Take by mouth daily.       No facility-administered medications prior to visit.     Allergies:   Codeine, Lisinopril, and Morphine   Past Medical History:  Diagnosis Date  . Anemia   . ANXIETY DEPRESSION 04/14/2009  .  GERD 09/07/2009  . HYPERLIPIDEMIA 09/07/2009  . HYPERTENSION 07/02/2009  . KNEE PAIN, RIGHT 12/07/2009  . MIGRAINE UNSP W/O INTRACT W/O STATUS MIGRAINOSUS 07/02/2009  . Palpitations    a. never documented arrhythmias  . Papilloma of right breast   . PONV (postoperative nausea and vomiting)   . Recurrent UTI     Past Surgical History:  Procedure Laterality Date  . ABDOMINAL HYSTERECTOMY  1997  . APPENDECTOMY  1964  . BREAST LUMPECTOMY WITH RADIOACTIVE SEED LOCALIZATION Right 04/09/2015   Procedure: RIGHT BREAST LUMPECTOMY WITH RADIOACTIVE SEED LOCALIZATION;  Surgeon: Alphonsa Overall, MD;  Location: [place];  Service: General;  Laterality: Right;  . COLONOSCOPY N/A 03/26/2014   Procedure: COLONOSCOPY;  Surgeon: Milus Banister, MD;  Location: [facility];  Service: Endoscopy;  Laterality: N/A;  . ESOPHAGOGASTRODUODENOSCOPY N/A 03/26/2014   Procedure: ESOPHAGOGASTRODUODENOSCOPY (EGD);  Surgeon: Milus Banister, MD;   Location: [facility];  Service: Endoscopy;  Laterality: N/A;  . Septorhinoplasty  1979     Social History:  The patient  reports that she has never smoked. She has never used smokeless tobacco. She reports current alcohol use. She reports that she does not use drugs.   Family History:  The patient's family history includes Breast cancer in her mother; Hypertension in her father, mother, paternal grandfather, and paternal grandmother; Lung cancer in her father.    ROS:  Please see the history of present illness. All other systems are reviewed and  Negative to the above problem except as noted.    PHYSICAL EXAM: VS:  BP 136/88   Pulse 65   Ht 5\' 7"  (1.702 m)   Wt 197 lb 3.2 oz (89.4 kg)   BMI 30.89 kg/m   GEN: Obese 69 yo  in no acute distress  HEENT: normal  Neck: no JVD, carotid bruits, or masses Cardiac: RRR  no murmurs, NO LE edema  Respiratory:  clear to auscultation bilaterally, normal work of breathing GI: soft, nontender, nondistended, + BS  No hepatomegaly  MS: no deformity Moving all extremities   Skin: warm and dry, no rash Neuro:  Strength and sensation are intact Psych: euthymic mood, full affect   EKG:  EKG is ordered today. Sinus bradycardia  52 bpm  First degree AV block  PR interval 224 msec     Lipid Panel    Component Value Date/Time   CHOL 154 01/30/2017 0831   TRIG 107 01/30/2017 0831   HDL 60 01/30/2017 0831   CHOLHDL 2.6 01/30/2017 0831   CHOLHDL 4 11/23/2015 1351   VLDL 26.4 11/23/2015 1351   LDLCALC 73 01/30/2017 0831   LDLDIRECT 118.9 12/07/2009 0953      Wt Readings from Last 3 Encounters:  11/26/19 197 lb 3.2 oz (89.4 kg)  07/24/16 196 lb (88.9 kg)  07/19/16 196 lb 12.8 oz (89.3 kg)      ASSESSMENT AND PLAN: 1 Atrial fibrillatoin    I have reviewed the strops from her apple watch  It does appear to be atrial fibrillation   Will get monitor downloaded to confirm  Would do better to maintain SR     Will get labs today  Consider  flecanide .  Will get CBC and BMET   Pt shold be on anticoagulation given age, femaile   2.  Blood pressure is borderline    Follow for right now (adds another score to CHADS VASC if low )  3  Hx of HA  Denies  4  Pulmonary Pt has signif abnormalitites on chest CT with nodules   Has been followed at [facility]  Need to review  5  Breast lesion  Pt had stereotactic [facility]x [facility] breast [facility]   Awaiting path    F/U in 10 to 12 months  Signed, Dorris Carnes, MD  11/27/2019 7:19 PM    [address] [address], [place], [place]  [address] Phone: [phone]; Fax: [phone]

## 2019-11-26 ENCOUNTER — Other Ambulatory Visit: Payer: Self-pay

## 2019-11-26 ENCOUNTER — Encounter: Payer: Self-pay | Admitting: Internal Medicine

## 2019-11-26 ENCOUNTER — Ambulatory Visit (INDEPENDENT_AMBULATORY_CARE_PROVIDER_SITE_OTHER): Payer: Medicare Other | Admitting: Internal Medicine

## 2019-11-26 ENCOUNTER — Telehealth: Payer: Self-pay | Admitting: Internal Medicine

## 2019-11-26 VITALS — BP 136/88 | HR 65 | Ht 67.0 in | Wt 197.2 lb

## 2019-11-26 DIAGNOSIS — I480 Paroxysmal atrial fibrillation: Secondary | ICD-10-CM

## 2019-11-26 DIAGNOSIS — R739 Hyperglycemia, unspecified: Secondary | ICD-10-CM

## 2019-11-26 LAB — CBC
Hematocrit: 28.4 % — ABNORMAL LOW (ref 34.0–46.6)
Hemoglobin: 9.1 g/dL — ABNORMAL LOW (ref 11.1–15.9)
MCH: 24.3 pg — ABNORMAL LOW (ref 26.6–33.0)
MCHC: 32 g/dL (ref 31.5–35.7)
MCV: 76 fL — ABNORMAL LOW (ref 79–97)
Platelets: 277 10*3/uL (ref 150–450)
RBC: 3.74 x10E6/uL — ABNORMAL LOW (ref 3.77–5.28)
RDW: 13.7 % (ref 11.7–15.4)
WBC: 7 10*3/uL (ref 3.4–10.8)

## 2019-11-26 LAB — BASIC METABOLIC PANEL
BUN/Creatinine Ratio: 22 (ref 12–28)
BUN: 17 mg/dL (ref 8–27)
CO2: 23 mmol/L (ref 20–29)
Calcium: 10 mg/dL (ref 8.7–10.3)
Chloride: 104 mmol/L (ref 96–106)
Creatinine, Ser: 0.79 mg/dL (ref 0.57–1.00)
GFR calc Af Amer: 90 mL/min/{1.73_m2} (ref >59)
GFR calc non Af Amer: 78 mL/min/{1.73_m2} (ref >59)
Glucose: 99 mg/dL (ref 65–99)
Potassium: 4.4 mmol/L (ref 3.5–5.2)
Sodium: 142 mmol/L (ref 134–144)

## 2019-11-26 NOTE — Telephone Encounter (Signed)
    Britney from [employer] calling to report abnormal zio result

## 2019-11-26 NOTE — Telephone Encounter (Signed)
Spoke with Brittney re pt's zio patch which shows 2 weeks of afib  Episode #8  Lasting 1 minute with average rate 185 and also 436 episodes of SVT .Pt was seen today by Dr Harrington Challenger and report was not available at that time Spoke with [facility]  Rep and will fax results to office at [phone]./cy

## 2019-11-26 NOTE — Patient Instructions (Signed)
Medication Instructions:  No changes today *If you need a refill on your cardiac medications before your next appointment, please call your pharmacy*  Lab Work: Today: cbc, bmet  Testing/Procedures: None today   Follow-Up: We will contact you for further follow up pending monitor results/plan  Other Instructions

## 2019-11-27 LAB — HEMOGLOBIN A1C
Est. average glucose Bld gHb Est-mCnc: 117 mg/dL
Hgb A1c MFr Bld: 5.7 % — ABNORMAL HIGH (ref 4.8–5.6)

## 2019-12-02 ENCOUNTER — Telehealth: Payer: Self-pay | Admitting: *Deleted

## 2019-12-02 MED ORDER — FLECAINIDE ACETATE 50 MG PO TABS: 50.0000 mg | ORAL_TABLET | Freq: Two times a day (BID) | ORAL | 3 refills | Status: DC

## 2019-12-02 NOTE — Telephone Encounter (Signed)
Patient has replied to my message.  See MyChart message from today for complete details.

## 2019-12-02 NOTE — Telephone Encounter (Signed)
Message from Dr. Harrington Challenger re: patient's heart monitor: Monitor did show some atrial fib with RVR I signed but did not forward to anyone. We reviewed apple watch which showed afib (in clinic). ______________________________________________________________________________________________  Hgb is low 9.1  Pt did say she had anemia Will need to be followe  Hgb A1C OK at 5.7 Electrolytes and kidnney function OK With increasing atrial fibrillation episodes would recomm starting Flecanide 50 mg BID Keep on other meds  Needs EKG in 10 days

## 2019-12-02 NOTE — Telephone Encounter (Signed)
Patient to start flecainide 50 mg bid for afib. Nurse visit for ekg on 12/12/19.

## 2019-12-02 NOTE — Telephone Encounter (Signed)
-----   Message from Fay Records, MD sent at 12/01/2019  9:39 AM EDT ----- Hgb is low 9.1   Pt did say she had anemia  Will need to be followe  Hgb A1C OK at 5.7 Electrolytes and kidnney function OK With increasing atrial fibrillation episodes would recomm starting Flecanide 50 mg BID  Keep on other meds  Needs EKG in 10 days

## 2019-12-02 NOTE — Telephone Encounter (Signed)
Detailed message left on VM (DPR) of lab results review by Dr. Harrington Challenger and that she recommends starting flecainide 50 mg twice a day for increased episodes of afib, and that EKG is needed about 10 days after starting this med.  Adv that I would be forwarding this info to her in [place] as well and she should either call me back or reply in [place] so I know she has gotten the information.

## 2019-12-12 ENCOUNTER — Other Ambulatory Visit: Payer: Self-pay

## 2019-12-12 ENCOUNTER — Ambulatory Visit (INDEPENDENT_AMBULATORY_CARE_PROVIDER_SITE_OTHER): Payer: Medicare Other | Admitting: *Deleted

## 2019-12-12 VITALS — HR 74 | Ht 67.0 in

## 2019-12-12 DIAGNOSIS — I480 Paroxysmal atrial fibrillation: Secondary | ICD-10-CM

## 2019-12-12 DIAGNOSIS — Z79899 Other long term (current) drug therapy: Secondary | ICD-10-CM

## 2019-12-12 NOTE — Patient Instructions (Signed)
Continue Flecainide

## 2019-12-12 NOTE — Progress Notes (Signed)
1.  Reason for visit: EKG post Flecainide start  2.  Name of MD requesting visit:  Dorris Carnes  3. H&P:  See epic  4.  ROS related to problem:    5.  Assessment and plan per MD:   EKG performed post Flecainide start on 6/23. EKG, today, showed NSR.  HR 74, PR 208, QRS 82 Reviewed w/ DOD, Dr. Caryl Comes, no orders received.  Pt aware to continue Flecainide. Will send note to RN or ordering provider to address what follow up is needed.

## 2020-01-20 ENCOUNTER — Telehealth: Payer: Self-pay | Admitting: Internal Medicine

## 2020-01-20 NOTE — Telephone Encounter (Signed)
Called patient back about her message. Patient is concerned about her rhythm and her low HR after starting flecainide. Patient stated she is asymptomatic, but she is having surgery next week and she wants to make sure she is cleared to have surgery with her low HR. 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 Informed patient that we have not received any clearance form from her surgeon, so she might not need clearance. Patient stated she is trying to be proactive. Patient is wanting to be seen by Dr. Harrington Challenger or someone in her office. There are no openings at this time. Since the patient does have issues with A. FIB and with flecainide will see if she can be seen in [facility]. Patient was seen there back in 2018. Called [facility] clinic they are able to see her. Made patient an appointment with them on Thursday. Will forward to Dr. Harrington Challenger and her nurse so they are aware.

## 2020-01-20 NOTE — Telephone Encounter (Signed)
Pt c/o medication issue:  1. Name of Medication: flecainide (TAMBOCOR) 50 MG tablet  2. How are you currently taking this medication (dosage and times per day)? 1 tablet by mouth 2 times daily   3. Are you having a reaction (difficulty breathing--STAT)? Yes   4. What is your medication issue? Katelyn [name] is calling stating she believes this medication is causing her to have a low HR. She states her HR has been ranging from 50-56, but she is not symptomatic. She is requesting a call from [facility] in regards to this. She is concerned about her upcoming surgery being performed due to this. Please advise.

## 2020-01-21 ENCOUNTER — Telehealth (HOSPITAL_COMMUNITY): Payer: Self-pay | Admitting: Cardiovascular Disease

## 2020-01-21 ENCOUNTER — Telehealth: Payer: Self-pay | Admitting: Internal Medicine

## 2020-01-21 DIAGNOSIS — I4891 Unspecified atrial fibrillation: Secondary | ICD-10-CM

## 2020-01-21 NOTE — Telephone Encounter (Signed)
New Message  Dr. Criss Alvine is calling from [facility] (Preop Assessment) wanting to speak to DOD. Transferred to DOD line.

## 2020-01-21 NOTE — Telephone Encounter (Signed)
I called patient to schedule echocardiogram per Dr. Johnsie Cancel and she state she is already scheduled for one at [facility] tomorrow 01/22/2020 and she will keep that appointment she states.  Order will be removed from the WQ.

## 2020-01-21 NOTE — Telephone Encounter (Signed)
See phone note in chart from today.

## 2020-01-21 NOTE — Telephone Encounter (Signed)
Dr. Johnsie Cancel, DOD, talked to Dr. Criss Alvine. He recommend patient get echo as soon as possible. Placed order and will send message to [facility]

## 2020-01-22 ENCOUNTER — Encounter (HOSPITAL_COMMUNITY): Payer: Self-pay | Admitting: Physician Assistant

## 2020-01-22 ENCOUNTER — Other Ambulatory Visit: Payer: Self-pay

## 2020-01-22 ENCOUNTER — Ambulatory Visit (HOSPITAL_COMMUNITY)
Admission: RE | Admit: 2020-01-22 | Discharge: 2020-01-22 | Disposition: A | Discharge status: Home / Self Care | Payer: Medicare Other | Source: Ambulatory Visit | Attending: Physician Assistant | Admitting: Physician Assistant

## 2020-01-22 VITALS — BP 136/80 | HR 63 | Ht 67.0 in | Wt 195.6 lb

## 2020-01-22 DIAGNOSIS — Z79890 Hormone replacement therapy (postmenopausal): Secondary | ICD-10-CM | POA: Insufficient documentation

## 2020-01-22 DIAGNOSIS — Z7982 Long term (current) use of aspirin: Secondary | ICD-10-CM | POA: Diagnosis not present

## 2020-01-22 DIAGNOSIS — E669 Obesity, unspecified: Secondary | ICD-10-CM | POA: Diagnosis not present

## 2020-01-22 DIAGNOSIS — I10 Essential (primary) hypertension: Secondary | ICD-10-CM | POA: Insufficient documentation

## 2020-01-22 DIAGNOSIS — E785 Hyperlipidemia, unspecified: Secondary | ICD-10-CM | POA: Diagnosis not present

## 2020-01-22 DIAGNOSIS — K219 Gastro-esophageal reflux disease without esophagitis: Secondary | ICD-10-CM | POA: Insufficient documentation

## 2020-01-22 DIAGNOSIS — F419 Anxiety disorder, unspecified: Secondary | ICD-10-CM | POA: Diagnosis not present

## 2020-01-22 DIAGNOSIS — D6869 Other thrombophilia: Secondary | ICD-10-CM | POA: Diagnosis not present

## 2020-01-22 DIAGNOSIS — Z6830 Body mass index (BMI) 30.0-30.9, adult: Secondary | ICD-10-CM | POA: Insufficient documentation

## 2020-01-22 DIAGNOSIS — I480 Paroxysmal atrial fibrillation: Secondary | ICD-10-CM | POA: Diagnosis not present

## 2020-01-22 DIAGNOSIS — Z79899 Other long term (current) drug therapy: Secondary | ICD-10-CM | POA: Insufficient documentation

## 2020-01-22 DIAGNOSIS — F329 Major depressive disorder, single episode, unspecified: Secondary | ICD-10-CM | POA: Insufficient documentation

## 2020-01-22 NOTE — Progress Notes (Signed)
Primary Care Physician: Nancee Liter, MD Primary Cardiologist: Dr Harrington Challenger Primary Electrophysiologist: none Referring Physician: Dr Melody Comas Katelyn [name] is a 69 y.o. female with a history of HTN, paroxysmal atrial fibrillation who presents for consultation in the [facility].  The patient was initially diagnosed with atrial fibrillation 11/2019 after presenting with symptoms of palpitations. She has a smart watch which did show afib and this was later confirmed on formal heart monitoring. She was started on flecainide. Patient is not currently on anticoagulation for a CHADS2VASC score of 3. She did note some heart rates ~50-55 bpm since starting flecainide. She had an echo 01/22/20 which showed preserved EF and mild MR. She denies alcohol use.   Today, she denies symptoms of chest pain, shortness of breath, orthopnea, PND, lower extremity edema, dizziness, presyncope, syncope, snoring, daytime somnolence, bleeding, or neurologic sequela. The patient is tolerating medications without difficulties and is otherwise without complaint today.    Atrial Fibrillation Risk Factors:  she does not have symptoms or diagnosis of sleep apnea. she does not have a history of rheumatic fever. she does not have a history of alcohol use.   she has a BMI of Body mass index is 30.64 kg/m.Marland Kitchen Filed Weights   01/22/20 1459  Weight: 88.7 kg    Family History  Problem Relation Age of Onset  . Hypertension Mother   . Breast cancer Mother   . Hypertension Father   . Lung cancer Father        textiles and smoker  . Hypertension Paternal Grandmother   . Hypertension Paternal Grandfather      Atrial Fibrillation Management history:  Previous antiarrhythmic drugs: flecainide Previous cardioversions: none Previous ablations: none CHADS2VASC score: 3 Anticoagulation history: none   Past Medical History:  Diagnosis Date  . Anemia   . ANXIETY DEPRESSION  04/14/2009  . GERD 09/07/2009  . HYPERLIPIDEMIA 09/07/2009  . HYPERTENSION 07/02/2009  . KNEE PAIN, RIGHT 12/07/2009  . MIGRAINE UNSP W/O INTRACT W/O STATUS MIGRAINOSUS 07/02/2009  . Palpitations    a. never documented arrhythmias  . Papilloma of right breast   . PONV (postoperative nausea and vomiting)   . Recurrent UTI    Past Surgical History:  Procedure Laterality Date  . ABDOMINAL HYSTERECTOMY  1997  . APPENDECTOMY  1964  . BREAST LUMPECTOMY WITH RADIOACTIVE SEED LOCALIZATION Right 04/09/2015   Procedure: RIGHT BREAST LUMPECTOMY WITH RADIOACTIVE SEED LOCALIZATION;  Surgeon: Alphonsa Overall, MD;  Location: [facility];  Service: General;  Laterality: Right;  . COLONOSCOPY N/A 03/26/2014   Procedure: COLONOSCOPY;  Surgeon: Milus Banister, MD;  Location: [facility];  Service: Endoscopy;  Laterality: N/A;  . ESOPHAGOGASTRODUODENOSCOPY N/A 03/26/2014   Procedure: ESOPHAGOGASTRODUODENOSCOPY (EGD);  Surgeon: Milus Banister, MD;  Location: [facility];  Service: Endoscopy;  Laterality: N/A;  . Septorhinoplasty  1979    Current Outpatient Medications  Medication Sig Dispense Refill  . aspirin 325 MG EC tablet Take 325 mg by mouth daily.    . busPIRone (BUSPAR) 10 MG tablet Take 1 tablet by mouth 3 (three) times daily.    Marland Kitchen conjugated estrogens (PREMARIN) vaginal cream Apply 0.5 grams intravaginally twice per week.    . diltiazem (CARDIZEM CD) 120 MG 24 hr capsule Take 1 capsule (120 mg total) by mouth daily. Need annual visit for further refills 90 capsule 0  . diphenhydrAMINE (SOMINEX) 25 MG tablet Take by mouth.    . ferrous sulfate 325 (65  FE) MG tablet Take 325 mg by mouth daily.    . flecainide (TAMBOCOR) 50 MG tablet Take 1 tablet (50 mg total) by mouth 2 (two) times daily. 180 tablet 3  . gabapentin (NEURONTIN) 300 MG capsule TAKE 1 CAPSULE BY MOUTH AT BEDTIME FOR 3 DAYS THEN TAKE 1 CAPSULE BY MOUTH TWICE DAILY FOR 3 DAYS THEN 1 CAPSULE THREE TIMES DAILY    .  montelukast (SINGULAIR) 10 MG tablet Take 10 mg by mouth at bedtime.    . Multiple Vitamin (MULTIVITAMIN) tablet Take 1 tablet by mouth daily.    . rosuvastatin (CRESTOR) 20 MG tablet Take 1 tablet (20 mg total) by mouth daily. 90 tablet 3  . SUMAtriptan (IMITREX) 100 MG tablet TAKE 1 TABLET BY MOUTH TWICE A DAY AS NEEDED FOR MIGRAINE 10 tablet 0   No current facility-administered medications for this encounter.    Allergies  Allergen Reactions  . Codeine Nausea And Vomiting  . Lisinopril Cough and Other (See Comments)    Other reaction(s): Cough (ALLERGY/intolerance) cough   . Morphine Hives, Itching and Nausea And Vomiting    Social History   Socioeconomic History  . Marital status: Divorced    Spouse name: Not on file  . Number of children: 1  . Years of education: Not on file  . Highest education level: Not on file  Occupational History  . Occupation: Programmer, multimedia: [employer]  Tobacco Use  . Smoking status: Never Smoker  . Smokeless tobacco: Never Used  Substance and Sexual Activity  . Alcohol use: Not Currently    Alcohol/week: 0.0 standard drinks    Comment: social occasional  . Drug use: No  . Sexual activity: Never    Birth control/protection: None  Other Topics Concern  . Not on file  Social History Narrative   Previously worked at [employer], has been stressed by 12 years of separation with husband who recently stopped paying alimony.  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     Social Determinants of Health   Financial Resource Strain:   . Difficulty of Paying Living Expenses:   Food Insecurity:   . Worried About Charity fundraiser in the Last Year:   . Arboriculturist in the Last Year:   Transportation Needs:   . Film/video editor (Medical):   Marland Kitchen Lack of Transportation (Non-Medical):   Physical Activity:   . Days of Exercise per Week:   . Minutes of Exercise per Session:   Stress:   . Feeling of Stress :   Social  Connections:   . Frequency of Communication with Friends and Family:   . Frequency of Social Gatherings with Friends and Family:   . Attends Religious Services:   . Active Member of Clubs or Organizations:   . Attends Archivist Meetings:   Marland Kitchen Marital Status:   Intimate Partner Violence:   . Fear of Current or Ex-Partner:   . Emotionally Abused:   Marland Kitchen Physically Abused:   . Sexually Abused:      ROS- All systems are reviewed and negative except as per the HPI above.  Physical Exam: Vitals:   01/22/20 1459  BP: 136/80  Pulse: 63  Weight: 88.7 kg  Height: 5\' 7"  (1.702 m)    GEN- The patient is well appearing obese female, alert and oriented x 3 today.   Head- normocephalic, atraumatic Eyes-  Sclera clear, conjunctiva pink Ears- hearing  intact Oropharynx- clear Neck- supple  Lungs- Clear to ausculation bilaterally, normal work of breathing Heart- Regular rate and rhythm, no murmurs, rubs or gallops  GI- soft, NT, ND, + BS Extremities- no clubbing, cyanosis, or edema MS- no significant deformity or atrophy Skin- no rash or lesion Psych- euthymic mood, full affect Neuro- strength and sensation are intact  Wt Readings from Last 3 Encounters:  01/22/20 88.7 kg  11/26/19 89.4 kg  07/24/16 88.9 kg    EKG today demonstrates SR HR 63, PR 206, QRS 86, QTc 435  Echo 01/22/20 demonstrated [facility]) SUMMARY  The left ventricular size is normal.  There is normal left ventricular wall thickness.  Left ventricular systolic function is normal.  LV ejection fraction = 60-65%.  Left ventricular filling pattern is indeterminate.  No segmental wall motion abnormalities seen in the left ventricle  The right ventricle is normal in size and function.  The left atrium is mildly dilated.  There is mild mitral regurgitation.  IVC size was normal.  There is no pericardial effusion.  No comparison study available.  Epic records are reviewed at length  today  CHA2DS2-VASc Score = 3  The patient's score is based upon: CHF History: 0 HTN History: 1 Age : 1 Diabetes History: 0 Stroke History: 0 Vascular Disease History: 0 Gender: 1      ASSESSMENT AND PLAN: 1. Paroxysmal Atrial Fibrillation (ICD10:  I48.0) The patient's CHA2DS2-VASc score is 3, indicating a 3.2% annual risk of stroke.   General education about afib provided today and questions answered.  We also discussed her stroke risk and the risks and benefits of anticoagulation. Based on her CHADS2VASC score, would recommend starting anticoagulation after her surgical procedure.  Continue flecainide 50 mg BID Continue diltiazem 120 mg daily  2. Secondary Hypercoagulable State (ICD10:  D68.69) The patient is at significant risk for stroke/thromboembolism based upon her CHA2DS2-VASc Score of 3.    3. Obesity Body mass index is 30.64 kg/m. Lifestyle modification was discussed at length including regular exercise and weight reduction.  4. HTN Stable, no changes today.   Follow up with Dr Harrington Challenger post surgery at [facility] (01/26/20).   [facility] [address] [place], [place] [address] [phone] 01/22/2020 4:44 PM

## 2020-02-02 ENCOUNTER — Telehealth: Payer: Self-pay | Admitting: Internal Medicine

## 2020-02-02 DIAGNOSIS — I480 Paroxysmal atrial fibrillation: Secondary | ICD-10-CM

## 2020-02-02 MED ORDER — APIXABAN 5 MG PO TABS: 5.0000 mg | ORAL_TABLET | Freq: Two times a day (BID) | ORAL | 6 refills | Status: DC

## 2020-02-02 NOTE — Telephone Encounter (Signed)
Pt wanted to share with Dr. Harrington Challenger that she is having more frequent episodes of afib and the ventricular rate is much slower, sometimes in the 69s.  She stopped inderal - weaned self off about 4 months ago.  Was taking for migraine prophylaxis and does not get those anymore.   Can be as high as 120 bpm without any activity changes.  She has a lot of ectopy according to her Apple Watch and worries about how much perfusion she is getting w a lower HR.  She is concerned about this and would like more information re: this.  Could the flecainide be controlling the rate but causing more frequent episodes?    Adv patient to stop aspirin 325 mg when starts Eliquis.

## 2020-02-02 NOTE — Telephone Encounter (Signed)
Sent prescription in for Eliquis 5 mg BID.  Pt will pick up and begin and will come in in 2 weeks for labs.

## 2020-02-02 NOTE — Telephone Encounter (Signed)
Pt wrote in that hse had surgery She is still in and out of fib     I will review labs    For now though she  should be on anticoagulatoin with Eliquis 5 bid   WIll need to keep close tabs on CBC Set up for cbc and BMET in 2 wks

## 2020-02-02 NOTE — Addendum Note (Signed)
Addended by: Rodman Key on: 02/02/2020 05:22 PM   Modules accepted: Orders

## 2020-02-03 NOTE — Telephone Encounter (Signed)
Follow up   Patient is calling back with some additional questions about her Eliquis.

## 2020-02-03 NOTE — Telephone Encounter (Signed)
Called back to speak with pt regarding her question r/t Eliquis.  Pt stated she wanted to speak with Michalene.  Advised she is off today but I would be glad to help her.  Pt states "no" and disconnected the call.

## 2020-02-04 ENCOUNTER — Telehealth: Payer: Self-pay | Admitting: Internal Medicine

## 2020-02-04 NOTE — Telephone Encounter (Signed)
Fay Records, MD 02/04/20 2:03 PM Note Received email notes through epic    Have her stop anticoagulant CHECK CBC and BMET  Will review     Patient has been informed.  She has not started Eliquis yet.  Will come Friday for lab work.  Eliquis discontinued from med list.

## 2020-02-04 NOTE — Telephone Encounter (Signed)
Received email notes through epic    Have her stop anticoagulant CHECK CBC and BMET  Will review

## 2020-02-06 ENCOUNTER — Other Ambulatory Visit: Payer: Medicare Other | Admitting: *Deleted

## 2020-02-06 ENCOUNTER — Other Ambulatory Visit: Payer: Self-pay

## 2020-02-06 DIAGNOSIS — I480 Paroxysmal atrial fibrillation: Secondary | ICD-10-CM

## 2020-02-06 LAB — CBC
Hematocrit: 38.4 % (ref 34.0–46.6)
Hemoglobin: 12.4 g/dL (ref 11.1–15.9)
MCH: 25.9 pg — ABNORMAL LOW (ref 26.6–33.0)
MCHC: 32.3 g/dL (ref 31.5–35.7)
MCV: 80 fL (ref 79–97)
Platelets: 265 10*3/uL (ref 150–450)
RBC: 4.78 x10E6/uL (ref 3.77–5.28)
RDW: 16.4 % — ABNORMAL HIGH (ref 11.7–15.4)
WBC: 7.3 10*3/uL (ref 3.4–10.8)

## 2020-02-06 LAB — BASIC METABOLIC PANEL
BUN/Creatinine Ratio: 12 (ref 12–28)
BUN: 10 mg/dL (ref 8–27)
CO2: 24 mmol/L (ref 20–29)
Calcium: 9.4 mg/dL (ref 8.7–10.3)
Chloride: 100 mmol/L (ref 96–106)
Creatinine, Ser: 0.86 mg/dL (ref 0.57–1.00)
GFR calc Af Amer: 81 mL/min/{1.73_m2} (ref >59)
GFR calc non Af Amer: 71 mL/min/{1.73_m2} (ref >59)
Glucose: 84 mg/dL (ref 65–99)
Potassium: 3.8 mmol/L (ref 3.5–5.2)
Sodium: 139 mmol/L (ref 134–144)

## 2020-02-11 ENCOUNTER — Telehealth: Payer: Self-pay | Admitting: *Deleted

## 2020-02-11 DIAGNOSIS — I480 Paroxysmal atrial fibrillation: Secondary | ICD-10-CM

## 2020-02-11 MED ORDER — APIXABAN 5 MG PO TABS: 5.0000 mg | ORAL_TABLET | Freq: Two times a day (BID) | ORAL | 11 refills | Status: DC

## 2020-02-11 NOTE — Telephone Encounter (Signed)
-----   Message from Porter, MD sent at 02/11/2020  3:01 PM EDT ----- Spoke with patient    CBC normal With PAF I would recomm starting Eliquis 5 bid  With hx of anemia I would recomm close follow up of CBC Would check CBC in 6 wks

## 2020-02-11 NOTE — Telephone Encounter (Signed)
Added Eliquis and the CBC order for in 6 weeks. myChart message to patient to make lab appointment.

## 2020-02-17 ENCOUNTER — Other Ambulatory Visit: Payer: Medicare Other

## 2020-02-17 ENCOUNTER — Telehealth: Payer: Self-pay | Admitting: Internal Medicine

## 2020-02-17 NOTE — Telephone Encounter (Signed)
Please let patient know that pharmacy said OK to use Eliquis and Cymbalta together   Cymbalta has some antiplatet properites   Watch for bruising   Overall though OK

## 2020-02-18 NOTE — Telephone Encounter (Signed)
Called and spoke with patient.  Informed it is overall ok to use Cymbalta and Eliquis together.  Bruising may be little worse than normal due to added antiplatelet property of Cymbalta.  She is pleased to hear they are ok to take together.   Thanked me for call.

## 2020-11-26 ENCOUNTER — Telehealth: Payer: Self-pay

## 2020-11-26 MED ORDER — FLECAINIDE ACETATE 50 MG PO TABS: 50.0000 mg | ORAL_TABLET | Freq: Two times a day (BID) | ORAL | 0 refills | Status: DC

## 2020-11-26 NOTE — Telephone Encounter (Signed)
*  STAT* If patient is at the pharmacy, call can be transferred to refill team.   1. Which medications need to be refilled? (please list name of each medication and dose if known) Flecainide   2. Which pharmacy/location (including street and city if local pharmacy) is medication to be sent to? [facility]  in [facility]   3. Do they need a 30 day or 90 day supply? 62   Spoke with patient and advised her that she needed a ow up scheduled since she has not been seen in over a year. Advised that I could give her thirty days of medication and she would need to make an appointment.   Patient transfer to back main line and advised her to choose the scheduling option to schedule a follow up appointment. She voiced understanding and thanked me for my help.   Rx(s) sent to pharmacy electronically.

## 2020-11-30 ENCOUNTER — Encounter: Payer: Self-pay | Admitting: Physician Assistant

## 2020-11-30 ENCOUNTER — Ambulatory Visit (INDEPENDENT_AMBULATORY_CARE_PROVIDER_SITE_OTHER): Payer: Medicare Other | Admitting: Physician Assistant

## 2020-11-30 ENCOUNTER — Other Ambulatory Visit: Payer: Self-pay

## 2020-11-30 VITALS — BP 150/90 | HR 51 | Ht 67.0 in | Wt 206.0 lb

## 2020-11-30 DIAGNOSIS — I10 Essential (primary) hypertension: Secondary | ICD-10-CM | POA: Diagnosis not present

## 2020-11-30 DIAGNOSIS — R911 Solitary pulmonary nodule: Secondary | ICD-10-CM | POA: Diagnosis not present

## 2020-11-30 DIAGNOSIS — I480 Paroxysmal atrial fibrillation: Secondary | ICD-10-CM

## 2020-11-30 NOTE — Progress Notes (Addendum)
Cardiology Office Note:    Date:  11/30/2020   ID:  Katelyn [name], DOB 03-04-1951, MRN [id number]  PCP:  Nancee Liter, MD   [facility] Providers Cardiologist:  Dorris Carnes, MD      Referring MD: Lovey Newcomer*   Chief Complaint:  Follow-up (Atrial fibrillation )    Patient Profile:    Katelyn [name] is a 70 y.o. 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 Suggest surveillance with repeat lowdose chest CT in 2 years. Follow-up date 05/14/2022.   Echocardiogram 01/22/20 ([facility]) SUMMARY  The left ventricular size is normal.  There is normal left ventricular wall thickness.  Left ventricular systolic function is normal.  LV ejection fraction = 60-65%.  Left ventricular filling pattern is indeterminate.  No segmental wall motion abnormalities seen in the left ventricle  The right ventricle is normal in size and function.  The left atrium is mildly dilated.  There is mild mitral regurgitation.  IVC size was normal.  There is no pericardial effusion.  No comparison study available.   Echocardiogram 12/18/14 - Left ventricle: The cavity size was normal. Wall thickness was    increased in a pattern of mild LVH. Systolic function was normal.    The estimated ejection fraction was in the range of 60% to 65%.    Wall motion was normal; there were no regional wall motion    abnormalities.  - Mitral valve: There was mild regurgitation.  - Pulmonary arteries: PA peak pressure: 31 mm Hg  (S).   CAC Score 12/16/14 IMPRESSION: Coronary calcium score of 0.   History of Present Illness: Katelyn [name] was evaluated by Dr. Harrington Challenger in 6/21 for paroxysmal atrial fibrillation.  She was started on Flecainide and had f/u in the AFib clinic in 8/21.  She was maintaining normal sinus rhythm.  She was pending surgery (lumpectomy in 8/21).  After this, she started on anticoagulation with Apixaban.  She returns for f/u.  She is here alone.  She continues to have paroxysms of atrial fibrillation.  Her heart rates range 100-130.  She is symptomatic with this.  Symptoms typically last 1 to 4 hours.  She has not had chest pain or significant shortness of breath.  She has not had syncope.  She did have some hematuria recently with a UTI.  However, she has not had any melena or hematochezia.    Past Medical History:  Diagnosis Date   Anemia    ANXIETY DEPRESSION 04/14/2009   GERD 09/07/2009   HYPERLIPIDEMIA 09/07/2009   HYPERTENSION 07/02/2009   KNEE PAIN, RIGHT 12/07/2009   MIGRAINE UNSP W/O INTRACT W/O STATUS MIGRAINOSUS 07/02/2009   Palpitations    a. never documented arrhythmias   Papilloma of right breast    PONV (postoperative nausea and vomiting)    Recurrent UTI     Current Medications: Current Meds  Medication Sig   apixaban (ELIQUIS) 5 MG TABS tablet Take 1 tablet (5 mg total) by mouth 2 (two) times daily.   busPIRone (BUSPAR) 10 MG  tablet Take 1 tablet by mouth 3 (three) times daily.   diltiazem (CARDIZEM CD) 120 MG 24 hr capsule Take 1 capsule (120 mg total) by mouth daily. Need annual visit for further refills   DULoxetine (CYMBALTA) 30 MG capsule Take 30 mg by mouth daily.   ferrous sulfate 325 (65 FE) MG tablet Take 325 mg by mouth daily.   flecainide (TAMBOCOR) 50 MG tablet Take 1 tablet (50 mg total) by mouth 2 (two) times daily.   fluticasone (FLONASE) 50 MCG/ACT nasal spray Place 2 sprays into the nose as needed for allergies or rhinitis.   montelukast (SINGULAIR) 10 MG tablet  Take 10 mg by mouth at bedtime.   Multiple Vitamin (MULTIVITAMIN) tablet Take 1 tablet by mouth daily.   propranolol (INDERAL) 80 MG tablet Take 1 tablet by mouth 3 (three) times daily.   rosuvastatin (CRESTOR) 20 MG tablet Take 1 tablet (20 mg total) by mouth daily.   SUMAtriptan (IMITREX) 100 MG tablet TAKE 1 TABLET BY MOUTH TWICE A DAY AS NEEDED FOR MIGRAINE     Allergies:   Codeine, Lisinopril, and Morphine   Social History   Tobacco Use   Smoking status: Never   Smokeless tobacco: Never  Substance Use Topics   Alcohol use: Not Currently    Alcohol/week: 0.0 standard drinks    Comment: social occasional   Drug use: No     Family Hx: The patient's family history includes Breast cancer in her mother; Hypertension in her father, mother, paternal grandfather, and paternal grandmother; Lung cancer in her father.  ROS   EKGs/Labs/Other Test Reviewed:    EKG:  EKG is  ordered today.  The ekg ordered today demonstrates sinus bradycardia, HR 51, normal axis, first-degree AV block, PR interval 226, QTc 453, no ST-T wave changes  Recent Labs: 02/06/2020: BUN 10; Creatinine, Ser 0.86; Hemoglobin 12.4; Platelets 265; Potassium 3.8; Sodium 139   Recent Lipid Panel Lab Results  Component Value Date/Time   CHOL 154 01/30/2017 08:31 AM   TRIG 107 01/30/2017 08:31 AM   HDL 60 01/30/2017 08:31 AM   LDLCALC 73 01/30/2017 08:31 AM   LDLDIRECT 118.9 12/07/2009 09:53 AM   Labs obtained through [place] - personally reviewed and interpreted: 08/01/20: K 3.8, Creatinine 0.77, Hgb 12.2    Risk Assessment/Calculations:    CHA2DS2-VASc Score = 3  This indicates a 3.2% annual risk of stroke. The patient's score is based upon: CHF History: No HTN History: Yes Diabetes History: No Stroke History: No Vascular Disease History: No Age Score: 1 Gender Score: 1     Physical Exam:    VS:  BP (!) 150/90   Pulse (!) 51   Ht 5\' 7"  (1.702 m)   Wt 206 lb (93.4 kg)   SpO2 97%   BMI 32.26  kg/m     Wt Readings from Last 3 Encounters:  11/30/20 206 lb (93.4 kg)  01/22/20 195 lb 9.6 oz (88.7 kg)  11/26/19 197 lb 3.2 oz (89.4 kg)     Constitutional:      Appearance: Healthy appearance. Not in distress.  Neck:     Vascular: JVD normal.  Pulmonary:     Effort: Pulmonary effort is normal.     Breath sounds: No wheezing. No rales.  Cardiovascular:     Normal rate. Regular rhythm. Normal S1. Normal S2.      Murmurs: There is no murmur.  Edema:    Peripheral edema absent.  Abdominal:     Palpations:  Abdomen is soft. There is no hepatomegaly.  Skin:    General: Skin is warm and dry.  Neurological:     Mental Status: Alert and oriented to person, place and time.     Cranial Nerves: Cranial nerves are intact.         ASSESSMENT & PLAN:    1. Paroxysmal atrial fibrillation (HCC) She continues to have paroxysms of atrial fibrillation.  She does take extra diltiazem and sometimes extra propranolol when she has episodes.  She has not really noticed any improved symptoms since starting on flecainide.  Therefore, I am not certain that increasing the dose would be beneficial.  We discussed referral to EP for consideration of PVI ablation.  She has already researched this and would like to see electrophysiology.  She would like to see Dr. Rayann Heman.  I will refer her to Dr. Rayann Heman for atrial fibrillation and consideration of PVI ablation.  For now, continue current dose of diltiazem, propranolol, flecainide, apixaban.  Follow-up with Dr. Harrington Challenger in 6 months.  2. Essential hypertension Blood pressure is uncontrolled.  She has gained weight recently and thinks it is related to this.  She plans to work on weight loss.  Her pressures have been better at times.  I have asked her to monitor blood pressure for the next 2 weeks and send me those readings for review.  If she has BP 150/90 or higher consistently, she knows to contact me sooner so that we can adjust her medications.  If she needs  medication, I would start her on an ARB.  3. Lung nodule This is followed by pulmonology at [facility] (Dr. Mariana Arn)      Dispo:  Return in about 6 months (around 06/01/2021) for Routine follow up in 6 months with Dr. Harrington Challenger. .   Medication Adjustments/Labs and Tests Ordered: Current medicines are reviewed at length with the patient today.  Concerns regarding medicines are outlined above.  Tests Ordered: Orders Placed This Encounter  Procedures   Ambulatory referral to Cardiac Electrophysiology   EKG 12-Lead   Medication Changes: No orders of the defined types were placed in this encounter.   Signed, Richardson Dopp, PA-C  11/30/2020 4:39 PM    [facility] [address], [place], [place]  [address] Phone: [phone]; Fax: [phone]

## 2020-11-30 NOTE — Patient Instructions (Addendum)
Medication Instructions:   -NONE  *If you need a refill on your cardiac medications before your next appointment, please call your pharmacy*   Lab Work:  -NONE  If you have labs (blood work) drawn today and your tests are completely normal, you will receive your results only by: [place] (if you have MyChart) OR A paper copy in the mail If you have any lab test that is abnormal or we need to change your treatment, we will call you to review the results.   Testing/Procedures:  -NONE   Follow-Up: At [facility], you and your health needs are our priority.  As part of our continuing mission to provide you with exceptional heart care, we have created designated Provider Care Teams.  These Care Teams include your primary Cardiologist (physician) and Advanced Practice Providers (APPs -  Physician Assistants and Nurse Practitioners) who all work together to provide you with the care you need, when you need it.  We recommend signing up for the patient portal called "MyChart".  Sign up information is provided on this After Visit Summary.  MyChart is used to connect with patients for Virtual Visits (Telemedicine).  Patients are able to view lab/test results, encounter notes, upcoming appointments, etc.  Non-urgent messages can be sent to your provider as well.   To learn more about what you can do with MyChart, go to [email or web address].    Your next appointment:   6 month(s) with Dr. Harrington Challenger on Thursday, December 8 @ 2:20 pm.  The format for your next appointment:   In Person  Provider:   Dorris Carnes, MD   Other Instructions  You have been referred to Dr. Rayann Heman for A-fib on Wednesday, August 10 @ 2:30 pm.   Please check blood pressure readings and call if BP is consistently above 150/90 X 3 times or send [employer].

## 2020-12-10 ENCOUNTER — Telehealth: Payer: Self-pay | Admitting: Internal Medicine

## 2020-12-10 DIAGNOSIS — I10 Essential (primary) hypertension: Secondary | ICD-10-CM

## 2020-12-10 MED ORDER — LOSARTAN POTASSIUM 25 MG PO TABS: 25.0000 mg | ORAL_TABLET | Freq: Every day | ORAL | 3 refills | Status: DC

## 2020-12-10 NOTE — Telephone Encounter (Signed)
Orders placed. Patient agreeable and verbalized understanding. Will bring or send in BP readings on lab appointment day 12/17/20.

## 2020-12-10 NOTE — Telephone Encounter (Signed)
PT is calling to let Nicki Reaper know her BP is averaging 150/90

## 2020-12-10 NOTE — Telephone Encounter (Signed)
I'm covering for [place] today. Notes and last labs reviewed.  Please add losartan 25mg  daily and arrange for f/u bmet in 1 wk (under [facility]).

## 2020-12-17 ENCOUNTER — Ambulatory Visit (HOSPITAL_COMMUNITY)
Admission: EM | Admit: 2020-12-17 | Discharge: 2020-12-17 | Disposition: A | Discharge status: Home / Self Care | Payer: Medicare Other | Attending: Emergency Medicine | Admitting: Emergency Medicine

## 2020-12-17 ENCOUNTER — Telehealth: Payer: Self-pay | Admitting: *Deleted

## 2020-12-17 ENCOUNTER — Other Ambulatory Visit: Payer: Medicare Other | Admitting: *Deleted

## 2020-12-17 ENCOUNTER — Ambulatory Visit (INDEPENDENT_AMBULATORY_CARE_PROVIDER_SITE_OTHER): Payer: Medicare Other

## 2020-12-17 ENCOUNTER — Encounter (HOSPITAL_COMMUNITY): Payer: Self-pay

## 2020-12-17 ENCOUNTER — Other Ambulatory Visit: Payer: Self-pay

## 2020-12-17 DIAGNOSIS — R059 Cough, unspecified: Secondary | ICD-10-CM

## 2020-12-17 DIAGNOSIS — I10 Essential (primary) hypertension: Secondary | ICD-10-CM

## 2020-12-17 DIAGNOSIS — Z79899 Other long term (current) drug therapy: Secondary | ICD-10-CM

## 2020-12-17 MED ORDER — PREDNISONE 10 MG (21) PO TBPK: ORAL_TABLET | Freq: Every day | ORAL | 0 refills | Status: DC

## 2020-12-17 MED ORDER — BENZONATATE 100 MG PO CAPS: 100.0000 mg | ORAL_CAPSULE | Freq: Three times a day (TID) | ORAL | 0 refills | Status: DC

## 2020-12-17 MED ORDER — LOSARTAN POTASSIUM 50 MG PO TABS: 50.0000 mg | ORAL_TABLET | Freq: Every day | ORAL | 3 refills | Status: DC

## 2020-12-17 MED ORDER — CETIRIZINE HCL 10 MG PO TABS: 10.0000 mg | ORAL_TABLET | Freq: Every day | ORAL | 0 refills | Status: DC

## 2020-12-17 NOTE — ED Provider Notes (Signed)
[place]    CSN: [id number] Arrival date & time: 12/17/20  1216      History   Chief Complaint Chief Complaint  Patient presents with   Cough   Nasal Congestion    HPI Katelyn [name] is a 70 y.o. female.   Patient presents with nasal congestion with colored sputum production, a feeling of a lump in her throat when she swallows and a congested cough but unable to cough up sputum for 6 weeks.  Has intermittent wheezing.  Denies shortness of breath, chest pain, chest tightness, fever, chills, ear pain or fullness, headaches, sore throat, nausea, vomiting, diarrhea.  Using Flonase and Singulair with no improvement.  Has history of left upper lobe lung nodule that has been stable since 2016 . Past Medical History:  Diagnosis Date   Anemia    ANXIETY DEPRESSION 04/14/2009   GERD 09/07/2009   HYPERLIPIDEMIA 09/07/2009   HYPERTENSION 07/02/2009   KNEE PAIN, RIGHT 12/07/2009   MIGRAINE UNSP W/O INTRACT W/O STATUS MIGRAINOSUS 07/02/2009   Palpitations    a. never documented arrhythmias   Papilloma of right breast    PONV (postoperative nausea and vomiting)    Recurrent UTI     Patient Active Problem List   Diagnosis Date Noted   Secondary hypercoagulable state ([place]) 01/22/2020   Routine general medical examination at a health care facility 07/26/2016   Lung nodule 11/23/2015   Nipple discharge 12/25/2014   Diverticulosis of colon without hemorrhage 03/26/2014   Iron deficiency anemia 03/26/2014   Essential hypertension, benign 08/06/2013   Migraine 08/06/2013   ANXIETY DEPRESSION 04/14/2009   Atrial fibrillation ([place]) 04/14/2009    Past Surgical History:  Procedure Laterality Date   ABDOMINAL HYSTERECTOMY  1997   APPENDECTOMY  1964   BREAST LUMPECTOMY WITH RADIOACTIVE SEED LOCALIZATION Right 04/09/2015   Procedure: RIGHT BREAST LUMPECTOMY WITH RADIOACTIVE SEED LOCALIZATION;  Surgeon: Alphonsa Overall, MD;  Location: [facility];  Service: General;   Laterality: Right;   COLONOSCOPY N/A 03/26/2014   Procedure: COLONOSCOPY;  Surgeon: Milus Banister, MD;  Location: [facility];  Service: Endoscopy;  Laterality: N/A;   ESOPHAGOGASTRODUODENOSCOPY N/A 03/26/2014   Procedure: ESOPHAGOGASTRODUODENOSCOPY (EGD);  Surgeon: Milus Banister, MD;  Location: [facility];  Service: Endoscopy;  Laterality: N/A;   Septorhinoplasty  1979    OB History     Gravida  1   Para  1   Term  1   Preterm      AB      Living  1      SAB      IAB      Ectopic      Multiple      Live Births  1            Home Medications    Prior to Admission medications   Medication Sig Start Date End Date Taking? Authorizing Provider  apixaban (ELIQUIS) 5 MG TABS tablet Take 1 tablet (5 mg total) by mouth 2 (two) times daily. 02/11/20  Yes Fay Records, MD  benzonatate (TESSALON) 100 MG capsule Take 1 capsule (100 mg total) by mouth every 8 (eight) hours. 12/17/20  Yes Ansleigh Safer R, NP  busPIRone (BUSPAR) 10 MG tablet Take 1 tablet by mouth 3 (three) times daily. 03/25/19  Yes [provider]  cetirizine (ZYRTEC ALLERGY) 10 MG tablet Take 1 tablet (10 mg total) by mouth daily. 12/17/20  Yes Catelyn Friel, Leitha Schuller, NP  diltiazem (CARDIZEM CD)  120 MG 24 hr capsule Take 1 capsule (120 mg total) by mouth daily. Need annual visit for further refills 06/29/17  Yes Hoyt Koch, MD  DULoxetine (CYMBALTA) 30 MG capsule Take 30 mg by mouth daily. 02/04/20  Yes [provider]  ferrous sulfate 325 (65 FE) MG tablet Take 325 mg by mouth daily.   Yes [provider]  flecainide (TAMBOCOR) 50 MG tablet Take 1 tablet (50 mg total) by mouth 2 (two) times daily. 11/26/20  Yes Fay Records, MD  fluticasone [facility]) 50 MCG/ACT nasal spray Place 2 sprays into the nose as needed for allergies or rhinitis.   Yes [provider]  losartan (COZAAR) 50 MG tablet Take 1 tablet (50 mg total) by mouth daily. 12/17/20  Yes Weaver, Scott T,  PA-C  montelukast (SINGULAIR) 10 MG tablet Take 10 mg by mouth at bedtime. 11/05/19  Yes [provider]  Multiple Vitamin (MULTIVITAMIN) tablet Take 1 tablet by mouth daily.   Yes [provider]  predniSONE (STERAPRED UNI-PAK 21 TAB) 10 MG (21) TBPK tablet Take by mouth daily. Take 6 tabs by mouth daily  for 2 days, then 5 tabs for 2 days, then 4 tabs for 2 days, then 3 tabs for 2 days, 2 tabs for 2 days, then 1 tab by mouth daily for 2 days 12/17/20  Yes Markeshia Giebel R, NP  propranolol (INDERAL) 80 MG tablet Take 1 tablet by mouth 3 (three) times daily. 09/20/20  Yes [provider]  rosuvastatin (CRESTOR) 20 MG tablet Take 1 tablet (20 mg total) by mouth daily. 09/21/16 12/17/20 Yes Fay Records, MD  SUMAtriptan (IMITREX) 100 MG tablet TAKE 1 TABLET BY MOUTH TWICE A DAY AS NEEDED FOR MIGRAINE 09/26/16  Yes Hoyt Koch, MD    Family History Family History  Problem Relation Age of Onset   Hypertension Mother    Breast cancer Mother    Hypertension Father    Lung cancer Father        textiles and smoker   Hypertension Paternal Grandmother    Hypertension Paternal Grandfather     Social History Social History   Tobacco Use   Smoking status: Never   Smokeless tobacco: Never  Substance Use Topics   Alcohol use: Not Currently    Alcohol/week: 0.0 standard drinks    Comment: social occasional   Drug use: No     Allergies   Codeine, Lisinopril, and Morphine   Review of Systems Review of Systems  Constitutional: Negative.   HENT:  Positive for congestion. Negative for dental problem, drooling, ear discharge, ear pain, facial swelling, hearing loss, mouth sores, nosebleeds, postnasal drip, rhinorrhea, sinus pressure, sinus pain, sneezing, sore throat, tinnitus, trouble swallowing and voice change.   Respiratory:  Positive for cough, shortness of breath and wheezing. Negative for apnea, choking, chest tightness and stridor.   Cardiovascular:  Negative.   Gastrointestinal: Negative.   Neurological: Negative.     Physical Exam Triage Vital Signs ED Triage Vitals  Enc Vitals Group     BP 12/17/20 1330 118/66     Pulse Rate 12/17/20 1330 (!) 57     Resp 12/17/20 1330 18     Temp 12/17/20 1330 98.3 F (36.8 C)     Temp Source 12/17/20 1330 Oral     SpO2 12/17/20 1330 96 %     Weight --      Height --      Head Circumference --  Peak Flow --      Pain Score 12/17/20 1326 0     Pain Loc --      Pain Edu? --      Excl. in [place]? --    No data found.  Updated Vital Signs BP 118/66 (BP Location: Left Arm)   Pulse (!) 57   Temp 98.3 F (36.8 C) (Oral)   Resp 18   SpO2 96%   Visual Acuity Right Eye Distance:   Left Eye Distance:   Bilateral Distance:    Right Eye Near:   Left Eye Near:    Bilateral Near:     Physical Exam Constitutional:      Appearance: Normal appearance. She is normal weight.  HENT:     Head: Normocephalic.     Right Ear: Tympanic membrane, ear canal and external ear normal.     Left Ear: Tympanic membrane, ear canal and external ear normal.     Nose: Congestion present. No rhinorrhea.     Mouth/Throat:     Mouth: Mucous membranes are moist.     Pharynx: Oropharynx is clear.  Eyes:     Extraocular Movements: Extraocular movements intact.  Neck:     Thyroid: No thyroid mass, thyromegaly or thyroid tenderness.  Cardiovascular:     Rate and Rhythm: Normal rate and regular rhythm.     Pulses: Normal pulses.     Heart sounds: Normal heart sounds.  Pulmonary:     Effort: Pulmonary effort is normal.     Breath sounds: Examination of the right-upper field reveals wheezing. Examination of the left-upper field reveals wheezing. Wheezing present.  Musculoskeletal:     Cervical back: Normal range of motion and neck supple.  Lymphadenopathy:     Cervical: No cervical adenopathy.  Neurological:     Mental Status: She is alert.     UC Treatments / Results  Labs (all labs ordered are  listed, but only abnormal results are displayed) Labs Reviewed - No data to display  EKG   Radiology DG Chest 2 View  Result Date: 12/17/2020 CLINICAL DATA:  Cough. EXAM: CHEST - 2 VIEW COMPARISON:  PA and lateral chest 11/15/2014.  CT chest 07/24/2016. FINDINGS: Lungs clear. Heart size normal. No pneumothorax or pleural fluid. But scratch the small hiatal hernia. No acute or focal bony abnormality. IMPRESSION: No acute disease. Small to moderate hiatal. Electronically Signed   By: Inge Rise M.D.   On: 12/17/2020 14:12    Procedures Procedures (including critical care time)  Medications Ordered in UC Medications - No data to display  Initial Impression / Assessment and Plan / UC Course  I have reviewed the triage vital signs and the nursing notes.  Pertinent labs & imaging results that were available during my care of the patient were reviewed by me and considered in my medical decision making (see chart for details).  Cough  1.  Chest x-ray negative 2.  Prednisone 60 mg taper 3.  Zyrtec 10 mg daily for.  Tessalon 100 mg 3 times daily. 5.  Continue use of Flonase and Singulair 6. For persistent symptoms please follow-up with pulmonologist Final Clinical Impressions(s) / UC Diagnoses   Final diagnoses:  Cough     Discharge Instructions      Your chest x-ray did not show infection, fluid or acute injury   Take prednisone as prescribed on bottle,avoids NSAIDS while taking, take with food and take in the morning  Take zyrtec daily  Can tessalon every  8 hours as needed  Continue use of Singulair as prescribed    ED Prescriptions     Medication Sig Dispense Auth. Provider   predniSONE (STERAPRED UNI-PAK 21 TAB) 10 MG (21) TBPK tablet Take by mouth daily. Take 6 tabs by mouth daily  for 2 days, then 5 tabs for 2 days, then 4 tabs for 2 days, then 3 tabs for 2 days, 2 tabs for 2 days, then 1 tab by mouth daily for 2 days 42 tablet Alydia Gosser R, NP   cetirizine  (ZYRTEC ALLERGY) 10 MG tablet Take 1 tablet (10 mg total) by mouth daily. 30 tablet Jalila Goodnough R, NP   benzonatate (TESSALON) 100 MG capsule Take 1 capsule (100 mg total) by mouth every 8 (eight) hours. 21 capsule Rayshaun Needle, Leitha Schuller, NP      PDMP not reviewed this encounter.   Hans Eden, NP 12/17/20 1535

## 2020-12-17 NOTE — ED Triage Notes (Signed)
Pt states she has been sick for about 6 weeks and is having brown/green mucus, feels lump in throat, and congested/rattling cough.

## 2020-12-17 NOTE — Discharge Instructions (Addendum)
Your chest x-ray did not show infection, fluid or acute injury   Take prednisone as prescribed on bottle,avoids NSAIDS while taking, take with food and take in the morning  Take zyrtec daily  Can tessalon every 8 hours as needed  Continue use of Singulair as prescribed

## 2020-12-17 NOTE — Telephone Encounter (Signed)
BP better but still above goal.  PLAN:  Increase Losartan to 50 mg once daily.  BMET in 2 weeks.  Richardson Dopp, PA-C  12/17/2020  07:46   Pt aware to increase Losartan to 50 mg daily.  She will take 25 mg (2) tabs daily until she runs out and then get the 50 mg RX filled.  (Was sent into pharmacy of choice).  She is aware to have blood work in 2 weeks.  Order placed.

## 2020-12-18 LAB — BASIC METABOLIC PANEL
BUN/Creatinine Ratio: 17 (ref 12–28)
BUN: 14 mg/dL (ref 8–27)
CO2: 27 mmol/L (ref 20–29)
Calcium: 9.4 mg/dL (ref 8.7–10.3)
Chloride: 105 mmol/L (ref 96–106)
Creatinine, Ser: 0.81 mg/dL (ref 0.57–1.00)
Glucose: 83 mg/dL (ref 65–99)
Potassium: 5.2 mmol/L (ref 3.5–5.2)
Sodium: 142 mmol/L (ref 134–144)
eGFR: 80 mL/min/{1.73_m2} (ref >59)

## 2020-12-22 NOTE — Progress Notes (Signed)
Pt has been made aware of normal result and verbalized understanding.  jw

## 2020-12-31 ENCOUNTER — Other Ambulatory Visit: Payer: Medicare Other | Admitting: *Deleted

## 2020-12-31 ENCOUNTER — Other Ambulatory Visit: Payer: Self-pay

## 2020-12-31 DIAGNOSIS — I10 Essential (primary) hypertension: Secondary | ICD-10-CM

## 2020-12-31 DIAGNOSIS — Z79899 Other long term (current) drug therapy: Secondary | ICD-10-CM

## 2021-01-01 LAB — BASIC METABOLIC PANEL
BUN/Creatinine Ratio: 16 (ref 12–28)
BUN: 12 mg/dL (ref 8–27)
CO2: 27 mmol/L (ref 20–29)
Calcium: 9 mg/dL (ref 8.7–10.3)
Chloride: 101 mmol/L (ref 96–106)
Creatinine, Ser: 0.77 mg/dL (ref 0.57–1.00)
Glucose: 94 mg/dL (ref 65–99)
Potassium: 4.7 mmol/L (ref 3.5–5.2)
Sodium: 141 mmol/L (ref 134–144)
eGFR: 84 mL/min/{1.73_m2} (ref >59)

## 2021-01-19 ENCOUNTER — Ambulatory Visit (INDEPENDENT_AMBULATORY_CARE_PROVIDER_SITE_OTHER): Payer: Medicare Other | Admitting: Internal Medicine

## 2021-01-19 ENCOUNTER — Encounter: Payer: Self-pay | Admitting: Internal Medicine

## 2021-01-19 ENCOUNTER — Other Ambulatory Visit: Payer: Self-pay

## 2021-01-19 VITALS — BP 130/90 | HR 49 | Ht 67.0 in | Wt 210.0 lb

## 2021-01-19 DIAGNOSIS — I10 Essential (primary) hypertension: Secondary | ICD-10-CM | POA: Diagnosis not present

## 2021-01-19 DIAGNOSIS — I480 Paroxysmal atrial fibrillation: Secondary | ICD-10-CM | POA: Diagnosis not present

## 2021-01-19 MED ORDER — LOSARTAN POTASSIUM 25 MG PO TABS: 75.0000 mg | ORAL_TABLET | Freq: Every day | ORAL | 3 refills | Status: DC

## 2021-01-19 NOTE — Progress Notes (Signed)
Electrophysiology Office Note   Date:  01/19/2021   ID:  Aleasha, Felling [birth year], MRN [id number]  PCP:  Nancee Liter, MD  Cardiologist:  Dr Harrington Challenger Primary Electrophysiologist: Thompson Grayer, MD    CC: afib   History of Present Illness: Katelyn [name] is a 70 y.o. female who presents today for electrophysiology evaluation.  She is referred by Dr Harrington Challenger and Ignacia Bayley.   She presents for EP consultation regarding afib.  The patient initially was diagnosed with AF 11/2019 after presenting with tachy palpitations.  She was started on diltiazem and flecainide.  Episodes occur several times per week, lasting up to 4 hours.  She reports increasing frequency and duration of afib since that time. Today, she denies symptoms of palpitations, chest pain, shortness of breath, orthopnea, PND, lower extremity edema, claudication, dizziness, presyncope, syncope, bleeding, or neurologic sequela. The patient is tolerating medications without difficulties and is otherwise without complaint today.    Past Medical History:  Diagnosis Date   Anemia    ANXIETY DEPRESSION 04/14/2009   GERD 09/07/2009   HYPERLIPIDEMIA 09/07/2009   HYPERTENSION 07/02/2009   KNEE PAIN, RIGHT 12/07/2009   MIGRAINE UNSP W/O INTRACT W/O STATUS MIGRAINOSUS 07/02/2009   Palpitations    a. never documented arrhythmias   Papilloma of right breast    PONV (postoperative nausea and vomiting)    Recurrent UTI    Past Surgical History:  Procedure Laterality Date   [place]   BREAST LUMPECTOMY WITH RADIOACTIVE SEED LOCALIZATION Right 04/09/2015   Procedure: RIGHT BREAST LUMPECTOMY WITH RADIOACTIVE SEED LOCALIZATION;  Surgeon: Alphonsa Overall, MD;  Location: [facility];  Service: General;  Laterality: Right;   COLONOSCOPY N/A 03/26/2014   Procedure: COLONOSCOPY;  Surgeon: Milus Banister, MD;  Location: [facility];  Service: Endoscopy;  Laterality: N/A;    ESOPHAGOGASTRODUODENOSCOPY N/A 03/26/2014   Procedure: ESOPHAGOGASTRODUODENOSCOPY (EGD);  Surgeon: Milus Banister, MD;  Location: [facility];  Service: Endoscopy;  Laterality: N/A;   Septorhinoplasty  1979     Current Outpatient Medications  Medication Sig Dispense Refill   apixaban (ELIQUIS) 5 MG TABS tablet Take 1 tablet (5 mg total) by mouth 2 (two) times daily. 60 tablet 11   busPIRone (BUSPAR) 10 MG tablet Take 1 tablet by mouth 3 (three) times daily.     diltiazem (CARDIZEM CD) 120 MG 24 hr capsule Take 1 capsule (120 mg total) by mouth daily. Need annual visit for further refills 90 capsule 0   DULoxetine (CYMBALTA) 30 MG capsule Take 30 mg by mouth daily.     ferrous sulfate 325 (65 FE) MG tablet Take 325 mg by mouth every other day.     flecainide (TAMBOCOR) 50 MG tablet Take 1 tablet (50 mg total) by mouth 2 (two) times daily. 180 tablet 0   fluticasone (FLONASE) 50 MCG/ACT nasal spray Place 2 sprays into the nose as needed for allergies or rhinitis.     losartan (COZAAR) 50 MG tablet Take 1 tablet (50 mg total) by mouth daily. 90 tablet 3   montelukast (SINGULAIR) 10 MG tablet Take 10 mg by mouth at bedtime.     Multiple Vitamin (MULTIVITAMIN) tablet Take 1 tablet by mouth daily.     propranolol (INDERAL) 80 MG tablet Take 1 tablet by mouth 3 (three) times daily.     SUMAtriptan (IMITREX) 100 MG tablet TAKE 1 TABLET BY MOUTH TWICE A DAY AS NEEDED FOR MIGRAINE 10  tablet 0   rosuvastatin (CRESTOR) 20 MG tablet Take 1 tablet (20 mg total) by mouth daily. 90 tablet 3   No current facility-administered medications for this visit.    Allergies:   Codeine, Lisinopril, and Morphine   Social History:  The patient  reports that she has never smoked. She has never used smokeless tobacco. She reports previous alcohol use. She reports that she does not use drugs.   Family History:  The patient's family history includes Breast cancer in her mother; Hypertension in her father, mother,  paternal grandfather, and paternal grandmother; Lung cancer in her father.    ROS:  Please see the history of present illness.   All other systems are personally reviewed and negative.    PHYSICAL EXAM: VS:  BP 130/90   Pulse (!) 49   Ht '5\' 7"'$  (1.702 m)   Wt 210 lb (95.3 kg)   SpO2 96%   BMI 32.89 kg/m  , BMI Body mass index is 32.89 kg/m. GEN: Well nourished, well developed, in no acute distress HEENT: normal Neck: no JVD, carotid bruits, or masses Cardiac: RRR; no murmurs, rubs, or gallops,no edema  Respiratory:  clear to auscultation bilaterally, normal work of breathing GI: soft, nontender, nondistended, + BS MS: no deformity or atrophy Skin: warm and dry  Neuro:  Strength and sensation are intact Psych: euthymic mood, full affect  EKG:  EKG is ordered today. The ekg ordered today is personally reviewed and shows sinus bradycardia   Recent Labs: 02/06/2020: Hemoglobin 12.4; Platelets 265 12/31/2020: BUN 12; Creatinine, Ser 0.77; Potassium 4.7; Sodium 141  personally reviewed   Lipid Panel     Component Value Date/Time   CHOL 154 01/30/2017 0831   TRIG 107 01/30/2017 0831   HDL 60 01/30/2017 0831   CHOLHDL 2.6 01/30/2017 0831   CHOLHDL 4 11/23/2015 1351   VLDL 26.4 11/23/2015 1351   LDLCALC 73 01/30/2017 0831   LDLDIRECT 118.9 12/07/2009 0953   personally reviewed   Wt Readings from Last 3 Encounters:  01/19/21 210 lb (95.3 kg)  11/30/20 206 lb (93.4 kg)  01/22/20 195 lb 9.6 oz (88.7 kg)      Other studies personally reviewed: Additional studies/ records that were reviewed today include: Leta Speller notes, prior echo, AF clinic notes  Review of the above records today demonstrates: as above   ASSESSMENT AND PLAN:  1.  Paroxysmal atrial fibrillation The patient has symptomatic, recurrent  atrial fibrillation. she has failed medical therapy with flecainide.  Medical therapy is limited by badycadria Chads2vasc score is at least 3.  she is  anticoagulated with eliquis . Therapeutic strategies for afib including medicine (tikosyn, amiodarone) and ablation were discussed in detail with the patient today. Risk, benefits, and alternatives to EP study and radiofrequency ablation for afib were also discussed in detail today. These risks include but are not limited to stroke, bleeding, vascular damage, tamponade, perforation, damage to the esophagus, lungs, and other structures, pulmonary vein stenosis, worsening renal function, and death. The patient understands these risk and wishes to proceed.  We will therefore proceed with catheter ablation at the next available time.  Carto, ICE, anesthesia are requested for the procedure.  Will also obtain cardiac CT prior to the procedure to exclude LAA thrombus and further evaluate atrial anatomy.  2. HTN Elevated Increase losartan to '75mg'$  daily today   Risks, benefits and potential toxicities for medications prescribed and/or refilled reviewed with patient today.     Signed, Thompson Grayer, MD  01/19/2021 2:47 PM     [place] [address] [phone] (office) [phone] (fax)

## 2021-01-19 NOTE — Patient Instructions (Addendum)
Medication Instructions:  Increase Cozaar 75 mg daily Your physician recommends that you continue on your current medications as directed. Please refer to the Current Medication list given to you today.  Labwork: None ordered.  Testing/Procedures: Your physician has requested that you have cardiac CT. Cardiac computed tomography (CT) is a painless test that uses an x-ray machine to take clear, detailed pictures of your heart. For further information please visit [facility]. Please follow instruction sheet as given.   Your physician has recommended that you have an ablation. Catheter ablation is a medical procedure used to treat some cardiac arrhythmias (irregular heartbeats). During catheter ablation, a long, thin, flexible tube is put into a blood vessel in your groin (upper thigh), or neck. This tube is called an ablation catheter. It is then guided to your heart through the blood vessel. Radio frequency waves destroy small areas of heart tissue where abnormal heartbeats may cause an arrhythmia to start. Please see the instruction sheet given to you today.   Follow-Up: Your physician wants you to follow-up in: Dates for ablation November 1, 8, 15, 29. Call Otila Kluver RN at [phone].   Any Other Special Instructions Will Be Listed Below (If Applicable).  If you need a refill on your cardiac medications before your next appointment, please call your pharmacy.     Cardiac Ablation Cardiac ablation is a procedure to destroy (ablate) some heart tissue that is sending bad signals. These bad signals causeproblems in heart rhythm. The heart has many areas that make these signals. If there are problems in these areas, they can make the heart beat in a way that is not normal.Destroying some tissues can help make the heart rhythm normal. Tell your doctor about: Any allergies you have. All medicines you are taking. These include vitamins, herbs, eye drops, creams, and over-the-counter  medicines. Any problems you or family members have had with medicines that make you fall asleep (anesthetics). Any blood disorders you have. Any surgeries you have had. Any medical conditions you have, such as kidney failure. Whether you are pregnant or may be pregnant. What are the risks? This is a safe procedure. But problems may occur, including: Infection. Bruising and bleeding. Bleeding into the chest. Stroke or blood clots. Damage to nearby areas of your body. Allergies to medicines or dyes. The need for a pacemaker if the normal system is damaged. Failure of the procedure to treat the problem. What happens before the procedure? Medicines Ask your doctor about: Changing or stopping your normal medicines. This is important. Taking aspirin and ibuprofen. Do not take these medicines unless your doctor tells you to take them. Taking other medicines, vitamins, herbs, and supplements. General instructions Follow instructions from your doctor about what you cannot eat or drink. Plan to have someone take you home from the hospital or clinic. If you will be going home right after the procedure, plan to have someone with you for 24 hours. Ask your doctor what steps will be taken to prevent infection. What happens during the procedure?  An IV tube will be put into one of your veins. You will be given a medicine to help you relax. The skin on your neck or groin will be numbed. A cut (incision) will be made in your neck or groin. A needle will be put through your cut and into a large vein. A tube (catheter) will be put into the needle. The tube will be moved to your heart. Dye may be put through the tube. This helps  your doctor see your heart. Small devices (electrodes) on the tube will send out signals. A type of energy will be used to destroy some heart tissue. The tube will be taken out. Pressure will be held on your cut. This helps stop bleeding. A bandage will be put over your  cut. The exact procedure may vary among doctors and hospitals. What happens after the procedure? You will be watched until you leave the hospital or clinic. This includes checking your heart rate, breathing rate, oxygen, and blood pressure. Your cut will be watched for bleeding. You will need to lie still for a few hours. Do not drive for 24 hours or as long as your doctor tells you. Summary Cardiac ablation is a procedure to destroy some heart tissue. This is done to treat heart rhythm problems. Tell your doctor about any medical conditions you may have. Tell him or her about all medicines you are taking to treat them. This is a safe procedure. But problems may occur. These include infection, bruising, bleeding, and damage to nearby areas of your body. Follow what your doctor tells you about food and drink. You may also be told to change or stop some of your medicines. After the procedure, do not drive for 24 hours or as long as your doctor tells you. This information is not intended to replace advice given to you by your health care provider. Make sure you discuss any questions you have with your healthcare provider. Document Revised: 05/01/2019 Document Reviewed: 05/01/2019 Elsevier Patient Education  2022 [employer].

## 2021-01-20 ENCOUNTER — Other Ambulatory Visit: Payer: Self-pay

## 2021-01-20 MED ORDER — LOSARTAN POTASSIUM 50 MG PO TABS: 75.0000 mg | ORAL_TABLET | Freq: Every day | ORAL | 3 refills | Status: DC

## 2021-02-04 ENCOUNTER — Telehealth: Payer: Self-pay | Admitting: Internal Medicine

## 2021-02-04 DIAGNOSIS — Z01818 Encounter for other preprocedural examination: Secondary | ICD-10-CM

## 2021-02-04 DIAGNOSIS — I480 Paroxysmal atrial fibrillation: Secondary | ICD-10-CM

## 2021-02-04 NOTE — Telephone Encounter (Signed)
Patient advised that she is at a new job and can not schedule her ablation at this time. May have to be in December or at the beginning of the year but she will call us back in October to let us know.

## 2021-02-04 NOTE — Telephone Encounter (Signed)
Pt is calling to speak with nurse Otila Kluver... states she was told to contact Nurse Otila Kluver when she made a decision about surgery... please advise

## 2021-02-11 NOTE — Telephone Encounter (Signed)
Pt aware Katelyn [name] will call week after next to schedule procedure Appears availability is into Oct anyway ./cy

## 2021-02-11 NOTE — Telephone Encounter (Signed)
Patient is following up. She states she is ready to schedule her ablation now. I informed her RN is on vacation next week and she would like to know if anyone else can schedule. Please advise.

## 2021-02-13 ENCOUNTER — Other Ambulatory Visit: Payer: Self-pay | Admitting: Internal Medicine

## 2021-02-15 NOTE — Telephone Encounter (Signed)
Eliquis '5mg'$  refill request received. Patient is 70 years old, weight-95.3kg, Crea-0.77 on 12/31/20, Diagnosis-Afib, and last seen by Dr. Rayann Heman on 01/19/21. Dose is appropriate based on dosing criteria. Will send in refill to requested pharmacy.

## 2021-02-16 ENCOUNTER — Other Ambulatory Visit: Payer: Self-pay | Admitting: Internal Medicine

## 2021-02-17 ENCOUNTER — Telehealth: Payer: Self-pay

## 2021-02-17 NOTE — Telephone Encounter (Signed)
Pt would like to schedule afib ablation.  Dates sent via [employer].

## 2021-02-21 NOTE — Telephone Encounter (Signed)
Picked ablation and lab dates. Will call later this week with further instructions. Patient verbalized understanding.

## 2021-02-21 NOTE — Telephone Encounter (Signed)
See phone note

## 2021-02-24 NOTE — Addendum Note (Signed)
Addended by: Darrell Jewel on: 02/24/2021 09:59 AM   Modules accepted: Orders

## 2021-02-25 ENCOUNTER — Encounter: Payer: Self-pay | Admitting: *Deleted

## 2021-02-28 ENCOUNTER — Other Ambulatory Visit: Payer: Self-pay | Admitting: Internal Medicine

## 2021-03-03 NOTE — Telephone Encounter (Signed)
Went over instructions with patient for CT and ablation. Verbalized understanding.

## 2021-03-29 ENCOUNTER — Other Ambulatory Visit: Payer: Medicare Other | Admitting: *Deleted

## 2021-03-29 ENCOUNTER — Other Ambulatory Visit: Payer: Self-pay

## 2021-03-29 DIAGNOSIS — Z01818 Encounter for other preprocedural examination: Secondary | ICD-10-CM

## 2021-03-29 DIAGNOSIS — I480 Paroxysmal atrial fibrillation: Secondary | ICD-10-CM

## 2021-03-29 LAB — CBC WITH DIFFERENTIAL/PLATELET
Basophils Absolute: 0 10*3/uL (ref 0.0–0.2)
Basos: 1 %
EOS (ABSOLUTE): 0.2 10*3/uL (ref 0.0–0.4)
Eos: 2 %
Hematocrit: 38.6 % (ref 34.0–46.6)
Hemoglobin: 12.8 g/dL (ref 11.1–15.9)
Lymphocytes Absolute: 2 10*3/uL (ref 0.7–3.1)
Lymphs: 26 %
MCH: 27.3 pg (ref 26.6–33.0)
MCHC: 33.2 g/dL (ref 31.5–35.7)
MCV: 82 fL (ref 79–97)
Monocytes Absolute: 0.9 10*3/uL (ref 0.1–0.9)
Monocytes: 12 %
Neutrophils Absolute: 4.6 10*3/uL (ref 1.4–7.0)
Neutrophils: 59 %
Platelets: 310 10*3/uL (ref 150–450)
RBC: 4.69 x10E6/uL (ref 3.77–5.28)
RDW: 15.8 % — ABNORMAL HIGH (ref 11.7–15.4)
WBC: 7.6 10*3/uL (ref 3.4–10.8)

## 2021-03-29 LAB — BASIC METABOLIC PANEL
BUN/Creatinine Ratio: 19 (ref 12–28)
BUN: 17 mg/dL (ref 8–27)
CO2: 30 mmol/L — ABNORMAL HIGH (ref 20–29)
Calcium: 10.3 mg/dL (ref 8.7–10.3)
Chloride: 104 mmol/L (ref 96–106)
Creatinine, Ser: 0.88 mg/dL (ref 0.57–1.00)
Glucose: 106 mg/dL — ABNORMAL HIGH (ref 70–99)
Potassium: 5 mmol/L (ref 3.5–5.2)
Sodium: 142 mmol/L (ref 134–144)
eGFR: 72 mL/min/{1.73_m2} (ref >59)

## 2021-04-12 ENCOUNTER — Telehealth (HOSPITAL_COMMUNITY): Payer: Self-pay | Admitting: Emergency Medicine

## 2021-04-12 NOTE — Telephone Encounter (Signed)
Reaching out to patient to offer assistance regarding upcoming cardiac imaging study; pt verbalizes understanding of appt date/time, parking situation and where to check in, pre-test NPO status and medications ordered, and verified current allergies; name and call back number provided for further questions should they arise Marchia Bond RN [facility] [phone] office [phone] cell  Denies iv issues

## 2021-04-14 ENCOUNTER — Ambulatory Visit (HOSPITAL_COMMUNITY)
Admission: RE | Admit: 2021-04-14 | Discharge: 2021-04-14 | Disposition: A | Discharge status: Home / Self Care | Payer: Medicare Other | Source: Ambulatory Visit | Attending: Internal Medicine | Admitting: Internal Medicine

## 2021-04-14 ENCOUNTER — Other Ambulatory Visit: Payer: Self-pay

## 2021-04-14 DIAGNOSIS — I480 Paroxysmal atrial fibrillation: Secondary | ICD-10-CM | POA: Insufficient documentation

## 2021-04-14 MED ORDER — NITROGLYCERIN 0.4 MG SL SUBL: 0.8000 mg | SUBLINGUAL_TABLET | Freq: Once | SUBLINGUAL | Status: DC

## 2021-04-14 MED ORDER — IOHEXOL 350 MG/ML SOLN
100.0000 mL | Freq: Once | INTRAVENOUS | Status: AC | PRN
  Administered 2021-04-14: 80 mL via INTRAVENOUS

## 2021-04-20 NOTE — Pre-Procedure Instructions (Signed)
Attempted to call patient regarding procedure instructions for tomorrow.  Left voice mail on the following items: Arrival time 0530 Nothing to eat or drink after midnight No meds AM of procedure Responsible person to drive you home and stay with you for 24 hrs  Have you missed any doses of anti-coagulant Eliquis- take both doses today, none in the morning

## 2021-04-21 ENCOUNTER — Ambulatory Visit (HOSPITAL_COMMUNITY): Payer: Medicare Other | Admitting: Certified Registered"

## 2021-04-21 ENCOUNTER — Other Ambulatory Visit: Payer: Self-pay

## 2021-04-21 ENCOUNTER — Encounter (HOSPITAL_COMMUNITY)
Admission: RE | Disposition: A | Discharge status: Home / Self Care | Payer: Self-pay | Source: Ambulatory Visit | Attending: Internal Medicine

## 2021-04-21 ENCOUNTER — Ambulatory Visit (HOSPITAL_COMMUNITY)
Admission: RE | Admit: 2021-04-21 | Discharge: 2021-04-21 | Disposition: A | Discharge status: Home / Self Care | Payer: Medicare Other | Source: Ambulatory Visit | Attending: Internal Medicine | Admitting: Internal Medicine

## 2021-04-21 ENCOUNTER — Encounter (HOSPITAL_COMMUNITY): Payer: Self-pay | Admitting: Internal Medicine

## 2021-04-21 DIAGNOSIS — Z7901 Long term (current) use of anticoagulants: Secondary | ICD-10-CM | POA: Insufficient documentation

## 2021-04-21 DIAGNOSIS — I10 Essential (primary) hypertension: Secondary | ICD-10-CM | POA: Diagnosis not present

## 2021-04-21 DIAGNOSIS — I480 Paroxysmal atrial fibrillation: Secondary | ICD-10-CM

## 2021-04-21 HISTORY — PX: ATRIAL FIBRILLATION ABLATION: EP1191

## 2021-04-21 LAB — POCT I-STAT, CHEM 8
BUN: 15 mg/dL (ref 8–23)
Calcium, Ion: 1.25 mmol/L (ref 1.15–1.40)
Chloride: 105 mmol/L (ref 98–111)
Creatinine, Ser: 0.9 mg/dL (ref 0.44–1.00)
Glucose, Bld: 100 mg/dL — ABNORMAL HIGH (ref 70–99)
HCT: 33 % — ABNORMAL LOW (ref 36.0–46.0)
Hemoglobin: 11.2 g/dL — ABNORMAL LOW (ref 12.0–15.0)
Potassium: 3.8 mmol/L (ref 3.5–5.1)
Sodium: 141 mmol/L (ref 135–145)
TCO2: 26 mmol/L (ref 22–32)

## 2021-04-21 LAB — POCT ACTIVATED CLOTTING TIME
Activated Clotting Time: 242 seconds
Activated Clotting Time: 265 seconds
Activated Clotting Time: 300 seconds

## 2021-04-21 SURGERY — ATRIAL FIBRILLATION ABLATION
Anesthesia: Monitor Anesthesia Care

## 2021-04-21 MED ORDER — HEPARIN SODIUM (PORCINE) 1000 UNIT/ML IJ SOLN
INTRAMUSCULAR | Status: DC | PRN
  Administered 2021-04-21: 15000 [IU] via INTRAVENOUS
  Administered 2021-04-21: 1000 [IU] via INTRAVENOUS

## 2021-04-21 MED ORDER — SODIUM CHLORIDE 0.9 % IV SOLN: 250.0000 mL | INTRAVENOUS | Status: DC | PRN

## 2021-04-21 MED ORDER — SODIUM CHLORIDE 0.9% FLUSH: 3.0000 mL | Freq: Two times a day (BID) | INTRAVENOUS | Status: DC

## 2021-04-21 MED ORDER — APIXABAN 5 MG PO TABS
5.0000 mg | ORAL_TABLET | Freq: Once | ORAL | Status: AC
  Administered 2021-04-21: 5 mg via ORAL
  Filled 2021-04-21: qty 1

## 2021-04-21 MED ORDER — LIDOCAINE 2% (20 MG/ML) 5 ML SYRINGE
INTRAMUSCULAR | Status: DC | PRN
  Administered 2021-04-21: 20 mg via INTRAVENOUS

## 2021-04-21 MED ORDER — SODIUM CHLORIDE 0.9% FLUSH: 3.0000 mL | INTRAVENOUS | Status: DC | PRN

## 2021-04-21 MED ORDER — FENTANYL CITRATE (PF) 100 MCG/2ML IJ SOLN
INTRAMUSCULAR | Status: DC | PRN
  Administered 2021-04-21 (×4): 25 ug via INTRAVENOUS

## 2021-04-21 MED ORDER — ONDANSETRON HCL 4 MG/2ML IJ SOLN
INTRAMUSCULAR | Status: DC | PRN
  Administered 2021-04-21: 4 mg via INTRAVENOUS

## 2021-04-21 MED ORDER — PROPOFOL 10 MG/ML IV BOLUS
INTRAVENOUS | Status: DC | PRN
  Administered 2021-04-21: 20 mg via INTRAVENOUS

## 2021-04-21 MED ORDER — ACETAMINOPHEN 325 MG PO TABS: 650.0000 mg | ORAL_TABLET | ORAL | Status: DC | PRN

## 2021-04-21 MED ORDER — DEXAMETHASONE SODIUM PHOSPHATE 10 MG/ML IJ SOLN
INTRAMUSCULAR | Status: DC | PRN
  Administered 2021-04-21: 5 mg via INTRAVENOUS

## 2021-04-21 MED ORDER — MIDAZOLAM HCL 5 MG/5ML IJ SOLN
INTRAMUSCULAR | Status: DC | PRN
  Administered 2021-04-21: 2 mg via INTRAVENOUS

## 2021-04-21 MED ORDER — PHENYLEPHRINE HCL-NACL 20-0.9 MG/250ML-% IV SOLN
INTRAVENOUS | Status: DC | PRN
  Administered 2021-04-21: 30 ug/min via INTRAVENOUS

## 2021-04-21 MED ORDER — PANTOPRAZOLE SODIUM 40 MG PO TBEC: 40.0000 mg | DELAYED_RELEASE_TABLET | Freq: Every day | ORAL | 0 refills | Status: DC

## 2021-04-21 MED ORDER — PROPOFOL 500 MG/50ML IV EMUL
INTRAVENOUS | Status: DC | PRN
  Administered 2021-04-21: 100 ug/kg/min via INTRAVENOUS

## 2021-04-21 MED ORDER — SODIUM CHLORIDE 0.9 % IV SOLN: INTRAVENOUS | Status: DC

## 2021-04-21 MED ORDER — PROTAMINE SULFATE 10 MG/ML IV SOLN
INTRAVENOUS | Status: DC | PRN
  Administered 2021-04-21: 40 mg via INTRAVENOUS

## 2021-04-21 MED ORDER — HEPARIN SODIUM (PORCINE) 1000 UNIT/ML IJ SOLN
INTRAMUSCULAR | Status: DC | PRN
  Administered 2021-04-21: 3000 [IU] via INTRAVENOUS
  Administered 2021-04-21: 5000 [IU] via INTRAVENOUS

## 2021-04-21 MED ORDER — HEPARIN (PORCINE) IN NACL 1000-0.9 UT/500ML-% IV SOLN
INTRAVENOUS | Status: DC | PRN
  Administered 2021-04-21 (×3): 500 mL

## 2021-04-21 MED ORDER — HEPARIN SODIUM (PORCINE) 1000 UNIT/ML IJ SOLN
INTRAMUSCULAR | Status: AC
  Filled 2021-04-21: qty 2

## 2021-04-21 MED ORDER — ONDANSETRON HCL 4 MG/2ML IJ SOLN: 4.0000 mg | Freq: Four times a day (QID) | INTRAMUSCULAR | Status: DC | PRN

## 2021-04-21 SURGICAL SUPPLY — 17 items
BLANKET WARM UNDERBOD FULL ACC (MISCELLANEOUS) ×2 IMPLANT
CATH 8FR REPROCESSED SOUNDSTAR (CATHETERS) ×2 IMPLANT
CATH OCTARAY 2.0 F 3-3-3-3-3 (CATHETERS) ×2 IMPLANT
CATH SMTCH THERMOCOOL SF DF (CATHETERS) ×2 IMPLANT
CATH WEB BI DIR CSDF CRV REPRO (CATHETERS) ×2 IMPLANT
CLOSURE PERCLOSE PROSTYLE (VASCULAR PRODUCTS) ×6 IMPLANT
COVER SWIFTLINK CONNECTOR (BAG) ×2 IMPLANT
NEEDLE BAYLIS TRANSSEPTAL 71CM (NEEDLE) ×2 IMPLANT
PACK EP LATEX FREE (CUSTOM PROCEDURE TRAY) ×2
PACK EP LF (CUSTOM PROCEDURE TRAY) ×1 IMPLANT
PAD PRO RADIOLUCENT 2001M-C (PAD) ×2 IMPLANT
PATCH CARTO3 (PAD) ×2 IMPLANT
SHEATH PINNACLE 7F 10CM (SHEATH) ×4 IMPLANT
SHEATH PINNACLE 9F 10CM (SHEATH) ×2 IMPLANT
SHEATH PROBE COVER 6X72 (BAG) ×2 IMPLANT
SHEATH SWARTZ TS SL2 63CM 8.5F (SHEATH) ×2 IMPLANT
TUBING SMART ABLATE COOLFLOW (TUBING) ×2 IMPLANT

## 2021-04-21 NOTE — Transfer of Care (Signed)
Immediate Anesthesia Transfer of Care Note  Patient: Katelyn [name]  Procedure(s) Performed: ATRIAL FIBRILLATION ABLATION  Patient Location: Cath Lab  Anesthesia Type:MAC  Level of Consciousness: sedated  Airway & Oxygen Therapy: Patient connected to nasal cannula oxygen  Post-op Assessment: Post -op Vital signs reviewed and stable  Post vital signs: stable  Last Vitals:  Vitals Value Taken Time  BP    Temp    Pulse    Resp    SpO2      Last Pain:  Vitals:   04/21/21 0549  TempSrc: Oral  PainSc:       Patients Stated Pain Goal: 3 (23/34/35 6861)  Complications: No notable events documented.

## 2021-04-21 NOTE — Discharge Instructions (Addendum)
Post procedure care instructions No driving for 4 days. No lifting over 5 lbs for 1 week. No vigorous or sexual activity for 1 week. You may return to work/your usual activities on 04/29/21. Keep procedure site clean & dry. If you notice increased pain, swelling, bleeding or pus, call/return!  You may shower after 24 hours, but no soaking in baths/hot tubs/pools for 1 week.     Cardiac Ablation, Care After  This sheet gives you information about how to care for yourself after your procedure. Your health care provider may also give you more specific instructions. If you have problems or questions, contact your health care provider. What can I expect after the procedure? After the procedure, it is common to have: Bruising around your puncture site. Tenderness around your puncture site. Skipped heartbeats. Tiredness (fatigue).  Follow these instructions at home: Puncture site care  Follow instructions from your health care provider about how to take care of your puncture site. Make sure you: If present, leave stitches (sutures), skin glue, or adhesive strips in place. These skin closures may need to stay in place for up to 2 weeks. If adhesive strip edges start to loosen and curl up, you may trim the loose edges. Do not remove adhesive strips completely unless your health care provider tells you to do that. If a large square bandage is present, this may be removed 24 hours after surgery.  Check your puncture site every day for signs of infection. Check for: Redness, swelling, or pain. Fluid or blood. If your puncture site starts to bleed, lie down on your back, apply firm pressure to the area, and contact your health care provider. Warmth. Pus or a bad smell. Driving Do not drive for at least 4 days after your procedure or however long your health care provider recommends. (Do not resume driving if you have previously been instructed not to drive for other health reasons.) Do not drive or use  heavy machinery while taking prescription pain medicine. Activity Avoid activities that take a lot of effort for at least 7 days after your procedure. Do not lift anything that is heavier than 5 lb (4.5 kg) for one week.  No sexual activity for 1 week.  Return to your normal activities as told by your health care provider. Ask your health care provider what activities are safe for you. General instructions Take over-the-counter and prescription medicines only as told by your health care provider. Do not use any products that contain nicotine or tobacco, such as cigarettes and e-cigarettes. If you need help quitting, ask your health care provider. You may shower after 24 hours, but Do not take baths, swim, or use a hot tub for 1 week.  Do not drink alcohol for 24 hours after your procedure. Keep all follow-up visits as told by your health care provider. This is important. Contact a health care provider if: You have redness, mild swelling, or pain around your puncture site. You have fluid or blood coming from your puncture site that stops after applying firm pressure to the area. Your puncture site feels warm to the touch. You have pus or a bad smell coming from your puncture site. You have a fever. You have chest pain or discomfort that spreads to your neck, jaw, or arm. You are sweating a lot. You feel nauseous. You have a fast or irregular heartbeat. You have shortness of breath. You are dizzy or light-headed and feel the need to lie down. You have pain or  numbness in the arm or leg closest to your puncture site. Get help right away if: Your puncture site suddenly swells. Your puncture site is bleeding and the bleeding does not stop after applying firm pressure to the area. These symptoms may represent a serious problem that is an emergency. Do not wait to see if the symptoms will go away. Get medical help right away. Call your local emergency services (911 in the [place] Do not drive  yourself to the hospital. Summary After the procedure, it is normal to have bruising and tenderness at the puncture site in your groin, neck, or forearm. Check your puncture site every day for signs of infection. Get help right away if your puncture site is bleeding and the bleeding does not stop after applying firm pressure to the area. This is a medical emergency. This information is not intended to replace advice given to you by your health care provider. Make sure you discuss any questions you have with your health care provider.     You have an appointment set up with the [facility].  Multiple studies have shown that being followed by a dedicated atrial fibrillation clinic in addition to the standard care you receive from your other physicians improves health. We believe that enrollment in the atrial fibrillation clinic will allow [place] to better care for you.   The phone number to the [facility] is [phone]. The clinic is staffed Monday through Friday from 8:30am to 5pm.  Parking Directions: The clinic is located in the Heart and Vascular Building connected to [facility]. 1)From [address] turn on to [address] and go to the 3rd entrance  (Heart and Vascular entrance) on the right. 2)Look to the right for Heart &Vascular Parking Garage. 3)A code for the entrance is required, for Dec is 5555.   4)Take the elevators to the 1st floor. Registration is in the room with the glass walls at the end of the hallway.  If you have any trouble parking or locating the clinic, please don't hesitate to call [phone].

## 2021-04-21 NOTE — Anesthesia Procedure Notes (Signed)
Procedure Name: MAC Date/Time: 04/21/2021 8:00 AM Performed by: Lavell Luster, CRNA Pre-anesthesia Checklist: Patient identified, Emergency Drugs available, Suction available, Patient being monitored and Timeout performed Patient Re-evaluated:Patient Re-evaluated prior to induction Oxygen Delivery Method: Nasal cannula Preoxygenation: Pre-oxygenation with 100% oxygen Induction Type: IV induction Ventilation: Oral airway inserted - appropriate to patient size Placement Confirmation: breath sounds checked- equal and bilateral and positive ETCO2 Dental Injury: Teeth and Oropharynx as per pre-operative assessment

## 2021-04-21 NOTE — Anesthesia Preprocedure Evaluation (Addendum)
Anesthesia Evaluation  Patient identified by MRN, date of birth, ID band Patient awake    Reviewed: Allergy & Precautions, NPO status , Patient's Chart, lab work & pertinent test results  History of Anesthesia Complications (+) PONV and history of anesthetic complications  Airway Mallampati: III  TM Distance: >3 FB Neck ROM: Full    Dental  (+) Dental Advisory Given, Teeth Intact   Pulmonary neg pulmonary ROS,    breath sounds clear to auscultation       Cardiovascular hypertension, + dysrhythmias Atrial Fibrillation  Rhythm:Regular  Left ventricle: The cavity size was normal. Wall thickness was  increased in a pattern of mild LVH. Systolic function was normal.  The estimated ejection fraction was in the range of 60% to 65%.  Wall motion was normal; there were no regional wall motion  abnormalities.  - Mitral valve: There was mild regurgitation.  - Pulmonary arteries: PA peak pressure: 31 mm Hg (S).    Neuro/Psych  Headaches, PSYCHIATRIC DISORDERS Depression    GI/Hepatic Neg liver ROS, GERD  ,  Endo/Other  negative endocrine ROS  Renal/GU negative Renal ROSLab Results      Component                Value               Date                      CREATININE               0.90                04/21/2021           Lab Results      Component                Value               Date                      K                        3.8                 04/21/2021                Musculoskeletal   Abdominal   Peds  Hematology  (+) Blood dyscrasia, anemia , Lab Results      Component                Value               Date                      WBC                      7.6                 03/29/2021                HGB                      11.2 (L)            04/21/2021                HCT  33.0 (L)            04/21/2021                MCV                      82                  03/29/2021                 PLT                      310                 03/29/2021              Anesthesia Other Findings   Reproductive/Obstetrics                             Anesthesia Physical Anesthesia Plan  ASA: 2  Anesthesia Plan: MAC   Post-op Pain Management:    Induction: Intravenous  PONV Risk Score and Plan: 3 and Propofol infusion and Treatment may vary due to age or medical condition  Airway Management Planned: Nasal Cannula  Additional Equipment: None  Intra-op Plan:   Post-operative Plan:   Informed Consent: I have reviewed the patients History and Physical, chart, labs and discussed the procedure including the risks, benefits and alternatives for the proposed anesthesia with the patient or authorized representative who has indicated his/her understanding and acceptance.     Dental advisory given  Plan Discussed with: CRNA and Anesthesiologist  Anesthesia Plan Comments: (dysphonia)       Anesthesia Quick Evaluation

## 2021-04-21 NOTE — H&P (Signed)
CC: afib   History of Present Illness: Katelyn [name] is a 70 y.o. female who presents today for electrophysiology study and ablation for afib.  She presents for EP consultation regarding afib.  The patient initially was diagnosed with AF 11/2019 after presenting with tachy palpitations.  She was started on diltiazem and flecainide.  Episodes occur several times per week, lasting up to 4 hours.   She reports increasing frequency and duration of afib since that time. Today, she denies symptoms of palpitations, chest pain, shortness of breath, orthopnea, PND, lower extremity edema, claudication, dizziness, presyncope, syncope, bleeding, or neurologic sequela. The patient is tolerating medications without difficulties and is otherwise without complaint today.          Past Medical History:  Diagnosis Date   Anemia     ANXIETY DEPRESSION 04/14/2009   GERD 09/07/2009   HYPERLIPIDEMIA 09/07/2009   HYPERTENSION 07/02/2009   KNEE PAIN, RIGHT 12/07/2009   MIGRAINE UNSP W/O INTRACT W/O STATUS MIGRAINOSUS 07/02/2009   Palpitations      a. never documented arrhythmias   Papilloma of right breast     PONV (postoperative nausea and vomiting)     Recurrent UTI           Past Surgical History:  Procedure Laterality Date   [place]   BREAST LUMPECTOMY WITH RADIOACTIVE SEED LOCALIZATION Right 04/09/2015    Procedure: RIGHT BREAST LUMPECTOMY WITH RADIOACTIVE SEED LOCALIZATION;  Surgeon: Alphonsa Overall, MD;  Location: [facility];  Service: General;  Laterality: Right;   COLONOSCOPY N/A 03/26/2014    Procedure: COLONOSCOPY;  Surgeon: Milus Banister, MD;  Location: [facility];  Service: Endoscopy;  Laterality: N/A;   ESOPHAGOGASTRODUODENOSCOPY N/A 03/26/2014    Procedure: ESOPHAGOGASTRODUODENOSCOPY (EGD);  Surgeon: Milus Banister, MD;  Location: [facility];  Service: Endoscopy;  Laterality: N/A;   Septorhinoplasty   1979              Current  Outpatient Medications  Medication Sig Dispense Refill   apixaban (ELIQUIS) 5 MG TABS tablet Take 1 tablet (5 mg total) by mouth 2 (two) times daily. 60 tablet 11   busPIRone (BUSPAR) 10 MG tablet Take 1 tablet by mouth 3 (three) times daily.       diltiazem (CARDIZEM CD) 120 MG 24 hr capsule Take 1 capsule (120 mg total) by mouth daily. Need annual visit for further refills 90 capsule 0   DULoxetine (CYMBALTA) 30 MG capsule Take 30 mg by mouth daily.       ferrous sulfate 325 (65 FE) MG tablet Take 325 mg by mouth every other day.       flecainide (TAMBOCOR) 50 MG tablet Take 1 tablet (50 mg total) by mouth 2 (two) times daily. 180 tablet 0   fluticasone (FLONASE) 50 MCG/ACT nasal spray Place 2 sprays into the nose as needed for allergies or rhinitis.       losartan (COZAAR) 50 MG tablet Take 1 tablet (50 mg total) by mouth daily. 90 tablet 3   montelukast (SINGULAIR) 10 MG tablet Take 10 mg by mouth at bedtime.       Multiple Vitamin (MULTIVITAMIN) tablet Take 1 tablet by mouth daily.       propranolol (INDERAL) 80 MG tablet Take 1 tablet by mouth 3 (three) times daily.       SUMAtriptan (IMITREX) 100 MG tablet TAKE 1 TABLET BY MOUTH TWICE A DAY AS NEEDED FOR MIGRAINE  10 tablet 0   rosuvastatin (CRESTOR) 20 MG tablet Take 1 tablet (20 mg total) by mouth daily. 90 tablet 3    No current facility-administered medications for this visit.      Allergies:   Codeine, Lisinopril, and Morphine    Social History:  The patient  reports that she has never smoked. She has never used smokeless tobacco. She reports previous alcohol use. She reports that she does not use drugs.    Family History:  The patient's family history includes Breast cancer in her mother; Hypertension in her father, mother, paternal grandfather, and paternal grandmother; Lung cancer in her father.      ROS:  Please see the history of present illness.   All other systems are personally reviewed and negative.      PHYSICAL  EXAM: Vitals:   04/21/21 0549  BP: (!) 143/71  Pulse: (!) 55  Resp: 14  Temp: 98.4 F (36.9 C)  SpO2: 98%   GEN: Well nourished, well developed, in no acute distress HEENT: normal Neck: no JVD, carotid bruits, or masses Cardiac: RRR; no murmurs, rubs, or gallops,no edema  Respiratory:  clear to auscultation bilaterally, normal work of breathing GI: soft, nontender, nondistended, + BS MS: no deformity or atrophy Skin: warm and dry  Neuro:  Strength and sensation are intact Psych: euthymic mood, full affect   EKG:  EKG is ordered today. The ekg ordered today is personally reviewed and shows sinus bradycardia         ASSESSMENT AND PLAN:   1.  Paroxysmal atrial fibrillation The patient has symptomatic, recurrent  atrial fibrillation. she has failed medical therapy with flecainide.  Medical therapy is limited by badycadria Chads2vasc score is at least 3.  she is anticoagulated with eliquis .   Risk, benefits, and alternatives to EP study and radiofrequency ablation for afib were again discussed in detail today. These risks include but are not limited to stroke, bleeding, vascular damage, tamponade, perforation, damage to the esophagus, lungs, and other structures, pulmonary vein stenosis, worsening renal function, and death. The patient understands these risk and wishes to proceed.    Cardiac CT reviewed at length with the patient today.  she reports compliance with [place] without interruption.  Thompson Grayer MD, [place] 04/21/2021 7:24 AM

## 2021-04-22 ENCOUNTER — Encounter (HOSPITAL_COMMUNITY): Payer: Self-pay | Admitting: Internal Medicine

## 2021-04-22 NOTE — Addendum Note (Signed)
Addendum  created 04/22/21 0932 by Oleta Mouse, MD   Clinical Note Signed

## 2021-04-22 NOTE — Anesthesia Postprocedure Evaluation (Signed)
Anesthesia Post Note  Patient: Katelyn [name]  Procedure(s) Performed: ATRIAL FIBRILLATION ABLATION     Patient location during evaluation: Cath Lab Anesthesia Type: MAC Level of consciousness: awake and alert Pain management: pain level controlled Vital Signs Assessment: post-procedure vital signs reviewed and stable Respiratory status: spontaneous breathing, nonlabored ventilation, respiratory function stable and patient connected to nasal cannula oxygen Cardiovascular status: stable and blood pressure returned to baseline Postop Assessment: no apparent nausea or vomiting Anesthetic complications: no   No notable events documented.  Last Vitals:  Vitals:   04/21/21 1200 04/21/21 1300  BP: (!) 104/56 110/61  Pulse: 62 (!) 58  Resp: 16 16  Temp:    SpO2: 92% 90%    Last Pain:  Vitals:   04/21/21 1049  TempSrc: Oral  PainSc:                  Evadna Donaghy

## 2021-05-19 ENCOUNTER — Ambulatory Visit (HOSPITAL_COMMUNITY)
Admission: RE | Admit: 2021-05-19 | Discharge: 2021-05-19 | Disposition: A | Discharge status: Home / Self Care | Payer: Medicare Other | Source: Ambulatory Visit | Attending: Physician Assistant | Admitting: Physician Assistant

## 2021-05-19 ENCOUNTER — Encounter (HOSPITAL_COMMUNITY): Payer: Self-pay | Admitting: Physician Assistant

## 2021-05-19 ENCOUNTER — Other Ambulatory Visit: Payer: Self-pay

## 2021-05-19 ENCOUNTER — Telehealth: Payer: Self-pay

## 2021-05-19 ENCOUNTER — Ambulatory Visit: Payer: Medicare Other | Admitting: Internal Medicine

## 2021-05-19 VITALS — BP 122/84 | HR 56 | Ht 67.0 in | Wt 210.0 lb

## 2021-05-19 DIAGNOSIS — I480 Paroxysmal atrial fibrillation: Secondary | ICD-10-CM | POA: Insufficient documentation

## 2021-05-19 DIAGNOSIS — D6869 Other thrombophilia: Secondary | ICD-10-CM | POA: Diagnosis not present

## 2021-05-19 DIAGNOSIS — I10 Essential (primary) hypertension: Secondary | ICD-10-CM | POA: Diagnosis not present

## 2021-05-19 DIAGNOSIS — Z6832 Body mass index (BMI) 32.0-32.9, adult: Secondary | ICD-10-CM | POA: Insufficient documentation

## 2021-05-19 DIAGNOSIS — Z7901 Long term (current) use of anticoagulants: Secondary | ICD-10-CM | POA: Diagnosis not present

## 2021-05-19 DIAGNOSIS — Z79899 Other long term (current) drug therapy: Secondary | ICD-10-CM | POA: Insufficient documentation

## 2021-05-19 DIAGNOSIS — Z7182 Exercise counseling: Secondary | ICD-10-CM | POA: Insufficient documentation

## 2021-05-19 DIAGNOSIS — E669 Obesity, unspecified: Secondary | ICD-10-CM | POA: Insufficient documentation

## 2021-05-19 NOTE — Progress Notes (Signed)
Primary Care Physician: Nancee Liter, MD Primary Cardiologist: Dr Harrington Challenger Primary Electrophysiologist: Dr Rayann Heman Referring Physician: Dr Melody Comas Katelyn [name] is a 70 y.o. female with a history of HTN, paroxysmal atrial fibrillation who presents for follow up in the [facility].  The patient was initially diagnosed with atrial fibrillation 11/2019 after presenting with symptoms of palpitations. She has a smart watch which did show afib and this was later confirmed on formal heart monitoring. She was started on flecainide. Patient is not currently on anticoagulation for a CHADS2VASC score of 3. She did note some heart rates ~50-55 bpm since starting flecainide. She had an echo 01/22/20 which showed preserved EF and mild MR. She denies alcohol use. She continued to have episodes of afib and was referred to Dr Rayann Heman for ablation consideration.   On follow up today, patient is s/p afib ablation with Dr Rayann Heman on 04/21/21. Patient reports that she has done very well since the procedure. She had only one episode of afib lasting about 2 hours. She denies CP, swallowing pain, or groin issues.   Today, she denies symptoms of palpitations, chest pain, shortness of breath, orthopnea, PND, lower extremity edema, dizziness, presyncope, syncope, snoring, daytime somnolence, bleeding, or neurologic sequela. The patient is tolerating medications without difficulties and is otherwise without complaint today.    Atrial Fibrillation Risk Factors:  she does not have symptoms or diagnosis of sleep apnea. she does not have a history of rheumatic fever. she does not have a history of alcohol use.   she has a BMI of Body mass index is 32.89 kg/m.Marland Kitchen Filed Weights   05/19/21 1046  Weight: 95.3 kg     Family History  Problem Relation Age of Onset   Hypertension Mother    Breast cancer Mother    Hypertension Father    Lung cancer Father        textiles and smoker    Hypertension Paternal Grandmother    Hypertension Paternal Grandfather      Atrial Fibrillation Management history:  Previous antiarrhythmic drugs: flecainide Previous cardioversions: none Previous ablations: 04/21/21 CHADS2VASC score: 3 Anticoagulation history: Eliquis   Past Medical History:  Diagnosis Date   Anemia    ANXIETY DEPRESSION 04/14/2009   GERD 09/07/2009   HYPERLIPIDEMIA 09/07/2009   HYPERTENSION 07/02/2009   KNEE PAIN, RIGHT 12/07/2009   MIGRAINE UNSP W/O INTRACT W/O STATUS MIGRAINOSUS 07/02/2009   Papilloma of right breast    Paroxysmal atrial fibrillation (HCC)    PONV (postoperative nausea and vomiting)    Recurrent UTI    Past Surgical History:  Procedure Laterality Date   [place] N/A 04/21/2021   Procedure: ATRIAL FIBRILLATION ABLATION;  Surgeon: Thompson Grayer, MD;  Location: [facility];  Service: Cardiovascular;  Laterality: N/A;   BREAST LUMPECTOMY WITH RADIOACTIVE SEED LOCALIZATION Right 04/09/2015   Procedure: RIGHT BREAST LUMPECTOMY WITH RADIOACTIVE SEED LOCALIZATION;  Surgeon: Alphonsa Overall, MD;  Location: [facility];  Service: General;  Laterality: Right;   COLONOSCOPY N/A 03/26/2014   Procedure: COLONOSCOPY;  Surgeon: Milus Banister, MD;  Location: [facility];  Service: Endoscopy;  Laterality: N/A;   ESOPHAGOGASTRODUODENOSCOPY N/A 03/26/2014   Procedure: ESOPHAGOGASTRODUODENOSCOPY (EGD);  Surgeon: Milus Banister, MD;  Location: [facility];  Service: Endoscopy;  Laterality: N/A;   Septorhinoplasty  1979    Current Outpatient Medications  Medication Sig Dispense Refill  apixaban (ELIQUIS) 5 MG TABS tablet Take 1 tablet by mouth twice daily 60 tablet 5   busPIRone (BUSPAR) 15 MG tablet Take 15 mg by mouth 2 (two) times daily.     Cholecalciferol (VITAMIN D3) 250 MCG (10000 UT) TABS Take 10,000 Units by mouth every evening.     Cod Liver Oil  1000 MG CAPS Take 1,000 mg by mouth at bedtime.     Coenzyme Q10 300 MG CAPS Take 300 mg by mouth at bedtime.     diltiazem (CARDIZEM CD) 120 MG 24 hr capsule Take 1 capsule (120 mg total) by mouth daily. Need annual visit for further refills 90 capsule 0   diphenhydrAMINE (BENADRYL) 50 MG capsule Take 50 mg by mouth at bedtime.     DULoxetine (CYMBALTA) 60 MG capsule Take 60 mg by mouth every evening.     ferrous sulfate 325 (65 FE) MG tablet Take 325 mg by mouth every 3 (three) days. In the evening     flecainide (TAMBOCOR) 50 MG tablet Take 1 tablet by mouth twice daily 180 tablet 0   fluticasone (FLONASE) 50 MCG/ACT nasal spray Place 2 sprays into the nose at bedtime as needed for allergies or rhinitis.     losartan (COZAAR) 50 MG tablet Take 1.5 tablets (75 mg total) by mouth daily. 135 tablet 3   Magnesium Oxide 500 MG CAPS Take 500 mg by mouth at bedtime.     montelukast (SINGULAIR) 10 MG tablet Take 10 mg by mouth at bedtime.     OVER THE COUNTER MEDICATION Take 350 mg by mouth at bedtime. Phytoceramides     propranolol (INDERAL) 80 MG tablet Take 80 mg by mouth 3 (three) times daily.     SUMAtriptan (IMITREX) 100 MG tablet TAKE 1 TABLET BY MOUTH TWICE A DAY AS NEEDED FOR MIGRAINE 10 tablet 0   Thiamine HCl (VITAMIN B-1) 250 MG tablet Take 250 mg by mouth at bedtime.     No current facility-administered medications for this encounter.    Allergies  Allergen Reactions   Codeine Nausea And Vomiting   Lisinopril Cough and Other (See Comments)    Other reaction(s): Cough (ALLERGY/intolerance) cough    Morphine Hives, Itching and Nausea And Vomiting    Social History   Socioeconomic History   Marital status: Divorced    Spouse name: Not on file   Number of children: 1   Years of education: Not on file   Highest education level: Not on file  Occupational History   Occupation: Programmer, multimedia: [employer]  Tobacco Use   Smoking status: Never   Smokeless  tobacco: Never  Substance and Sexual Activity   Alcohol use: Not Currently    Alcohol/week: 0.0 standard drinks    Comment: social occasional   Drug use: No   Sexual activity: Never    Birth control/protection: None  Other Topics Concern   Not on file  Social History Narrative   Previously worked at [employer], has been stressed by 12 years of separation with husband who recently stopped paying alimony.  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     Social Determinants of Health   Financial Resource Strain: Not on file  Food Insecurity: Not on file  Transportation Needs: Not on file  Physical Activity: Not on file  Stress: Not on file  Social Connections: Not on file  Intimate Partner Violence: Not on file     ROS-  All systems are reviewed and negative except as per the HPI above.  Physical Exam: Vitals:   05/19/21 1046  BP: 122/84  Pulse: (!) 56  Weight: 95.3 kg  Height: 5\' 7"  (1.702 m)    GEN- The patient is a well appearing obese female, alert and oriented x 3 today.   HEENT-head normocephalic, atraumatic, sclera clear, conjunctiva pink, hearing intact, trachea midline. Lungs- Clear to ausculation bilaterally, normal work of breathing Heart- Regular rate and rhythm, bradycardia, no murmurs, rubs or gallops  GI- soft, NT, ND, + BS Extremities- no clubbing, cyanosis, or edema MS- no significant deformity or atrophy Skin- no rash or lesion Psych- euthymic mood, full affect Neuro- strength and sensation are intact   Wt Readings from Last 3 Encounters:  05/19/21 95.3 kg  04/21/21 88.5 kg  01/19/21 95.3 kg    EKG today demonstrates  SB, 1st degree AV block Vent. rate 56 BPM PR interval 246 ms QRS duration 88 ms QT/QTcB 470/453 ms  Echo 01/22/20 demonstrated [facility]) SUMMARY   The left ventricular size is normal.   There is normal left ventricular wall thickness.   Left ventricular systolic function is normal.   LV ejection fraction = 60-65%.   Left  ventricular filling pattern is indeterminate.   No segmental wall motion abnormalities seen in the left ventricle   The right ventricle is normal in size and function.   The left atrium is mildly dilated.   There is mild mitral regurgitation.   IVC size was normal.   There is no pericardial effusion.   No comparison study available.   Epic records are reviewed at length today  CHA2DS2-VASc Score = 3  The patient's score is based upon: CHF History: 0 HTN History: 1 Diabetes History: 0 Stroke History: 0 Vascular Disease History: 0 Age Score: 1 Gender Score: 1      ASSESSMENT AND PLAN: 1. Paroxysmal Atrial Fibrillation (ICD10:  I48.0) The patient's CHA2DS2-VASc score is 3, indicating a 3.2% annual risk of stroke.   S/p afib ablation 04/21/21 Patient appears to be maintaining SR. Continue Eliquis 5 mg BID with no missed doses for 3 months post ablation.  Continue flecainide 50 mg BID Continue diltiazem 120 mg daily  2. Secondary Hypercoagulable State (ICD10:  D68.69) The patient is at significant risk for stroke/thromboembolism based upon her CHA2DS2-VASc Score of 3.    3. Obesity Body mass index is 32.89 kg/m. Lifestyle modification was discussed and encouraged including regular physical activity and weight reduction.  4. HTN Stable, no changes today.   Follow up with Dr Rayann Heman as scheduled.    [facility] [address] [place], [place] [address] [phone] 05/19/2021 11:16 AM

## 2021-05-19 NOTE — Telephone Encounter (Signed)
Appt cancelled for today with Dr. Harrington Challenger since the pt saw Katelyn Latus PA this morning for post Ablation. I added the pt to our list and a recall to be seen early this Spring but sooner if needed.

## 2021-05-29 ENCOUNTER — Other Ambulatory Visit: Payer: Self-pay | Admitting: Internal Medicine

## 2021-08-03 ENCOUNTER — Ambulatory Visit: Payer: Medicare Other | Admitting: Internal Medicine

## 2021-08-18 ENCOUNTER — Other Ambulatory Visit: Payer: Self-pay | Admitting: Internal Medicine

## 2021-08-18 NOTE — Telephone Encounter (Signed)
Prescription refill request for Eliquis received. ?Indication: afib  ?Last office visit: [place], 05/19/2021, 01/19/2021 [place] ?Scr: 0.90, 04/21/2021 ?Age: 71 yo  ?Weight: 95.3 kg  ? ?Refill sent.  ?

## 2021-09-01 ENCOUNTER — Ambulatory Visit: Payer: Medicare Other | Admitting: Internal Medicine

## 2021-09-23 ENCOUNTER — Ambulatory Visit (INDEPENDENT_AMBULATORY_CARE_PROVIDER_SITE_OTHER): Payer: Medicare Other | Admitting: Internal Medicine

## 2021-09-23 ENCOUNTER — Encounter (HOSPITAL_BASED_OUTPATIENT_CLINIC_OR_DEPARTMENT_OTHER): Payer: Self-pay | Admitting: Internal Medicine

## 2021-09-23 VITALS — BP 142/102 | HR 66 | Ht 67.0 in | Wt 200.0 lb

## 2021-09-23 DIAGNOSIS — I480 Paroxysmal atrial fibrillation: Secondary | ICD-10-CM | POA: Diagnosis not present

## 2021-09-23 DIAGNOSIS — D6869 Other thrombophilia: Secondary | ICD-10-CM | POA: Diagnosis not present

## 2021-09-23 DIAGNOSIS — I10 Essential (primary) hypertension: Secondary | ICD-10-CM | POA: Diagnosis not present

## 2021-09-23 MED ORDER — LOSARTAN POTASSIUM 100 MG PO TABS: 100.0000 mg | ORAL_TABLET | Freq: Every day | ORAL | 3 refills | Status: DC

## 2021-09-23 NOTE — Patient Instructions (Addendum)
Medication Instructions:  ?Your physician has recommended you make the following change in your medication:  ? ? INCREASE your losartan - Take 100 mg by mouth daily ? ?Labwork: ?None ordered. ? ?Testing/Procedures: ?None ordered. ? ?Follow-Up: ?Your physician wants you to follow-up in: 3 months with Thompson Grayer, MD  ? ?Any Other Special Instructions Will Be Listed Below (If Applicable). ? ?If you need a refill on your cardiac medications before your next appointment, please call your pharmacy.  ? ?Important Information About Sugar ? ? ? ? ? ? ? ?

## 2021-09-23 NOTE — Progress Notes (Signed)
? ? ?PCP: Molli Barrows, MD ?Primary Cardiologist: Dr Harrington Challenger ? ?Katelyn [name] is a 71 y.o. female who presents today for routine electrophysiology followup.  Since his recent afib ablation, the patient reports doing very well.  she denies procedure related complications and is pleased with the results of the procedure.  Unfortunately over the past few weeks, she has been having more afib.  + palpitations lasting < 6 hours.  Today, she denies symptoms of chest pain, shortness of breath,  lower extremity edema, dizziness, presyncope, or syncope.  The patient is otherwise without complaint today.  ? ?Past Medical History:  ?Diagnosis Date  ? Anemia   ? ANXIETY DEPRESSION 04/14/2009  ? GERD 09/07/2009  ? HYPERLIPIDEMIA 09/07/2009  ? HYPERTENSION 07/02/2009  ? KNEE PAIN, RIGHT 12/07/2009  ? MIGRAINE UNSP W/O INTRACT W/O STATUS MIGRAINOSUS 07/02/2009  ? Papilloma of right breast   ? Paroxysmal atrial fibrillation ([place])   ? PONV (postoperative nausea and vomiting)   ? Recurrent UTI   ? ?Past Surgical History:  ?Procedure Laterality Date  ? ABDOMINAL HYSTERECTOMY  1997  ? APPENDECTOMY  1964  ? ATRIAL FIBRILLATION ABLATION N/A 04/21/2021  ? Procedure: ATRIAL FIBRILLATION ABLATION;  Surgeon: Thompson Grayer, MD;  Location: [facility];  Service: Cardiovascular;  Laterality: N/A;  ? BREAST LUMPECTOMY WITH RADIOACTIVE SEED LOCALIZATION Right 04/09/2015  ? Procedure: RIGHT BREAST LUMPECTOMY WITH RADIOACTIVE SEED LOCALIZATION;  Surgeon: Alphonsa Overall, MD;  Location: [facility];  Service: General;  Laterality: Right;  ? COLONOSCOPY N/A 03/26/2014  ? Procedure: COLONOSCOPY;  Surgeon: Milus Banister, MD;  [facility];  Service: Endoscopy;  Laterality: N/A;  ? ESOPHAGOGASTRODUODENOSCOPY N/A 03/26/2014  ? Procedure: ESOPHAGOGASTRODUODENOSCOPY (EGD);  Surgeon: Milus Banister, MD;  Location: [facility];  Service: Endoscopy;  Laterality: N/A;  ? Septorhinoplasty  1979  ? ? ?ROS- all systems are  personally reviewed and negatives except as per HPI above ? ?Current Outpatient Medications  ?Medication Sig Dispense Refill  ? apixaban (ELIQUIS) 5 MG TABS tablet TAKE ONE TABLET BY MOUTH TWICE A DAY 60 tablet 5  ? busPIRone (BUSPAR) 15 MG tablet Take 15 mg by mouth 2 (two) times daily.    ? Cholecalciferol (VITAMIN D3) 250 MCG (10000 UT) TABS Take 10,000 Units by mouth every evening.    ? Cod Liver Oil 1000 MG CAPS Take 1,000 mg by mouth at bedtime.    ? Coenzyme Q10 300 MG CAPS Take 300 mg by mouth at bedtime.    ? diltiazem (CARDIZEM CD) 120 MG 24 hr capsule Take 1 capsule (120 mg total) by mouth daily. Need annual visit for further refills 90 capsule 0  ? diphenhydrAMINE (BENADRYL) 50 MG capsule Take 50 mg by mouth at bedtime.    ? DULoxetine (CYMBALTA) 60 MG capsule Take 60 mg by mouth every evening.    ? ferrous sulfate 325 (65 FE) MG tablet Take 325 mg by mouth every 3 (three) days. In the evening    ? flecainide (TAMBOCOR) 50 MG tablet Take 1 tablet by mouth twice daily 180 tablet 2  ? fluticasone (FLONASE) 50 MCG/ACT nasal spray Place 2 sprays into the nose at bedtime as needed for allergies or rhinitis.    ? losartan (COZAAR) 50 MG tablet Take 1.5 tablets (75 mg total) by mouth daily. 135 tablet 3  ? Magnesium Oxide 500 MG CAPS Take 500 mg by mouth at bedtime.    ? montelukast (SINGULAIR) 10 MG tablet Take 10 mg by  mouth at bedtime.    ? OVER THE COUNTER MEDICATION Take 350 mg by mouth at bedtime. Phytoceramides    ? propranolol (INDERAL) 80 MG tablet Take 80 mg by mouth 3 (three) times daily.    ? SUMAtriptan (IMITREX) 100 MG tablet TAKE 1 TABLET BY MOUTH TWICE A DAY AS NEEDED FOR MIGRAINE 10 tablet 0  ? Thiamine HCl (VITAMIN B-1) 250 MG tablet Take 250 mg by mouth at bedtime.    ? ?No current facility-administered medications for this visit.  ? ? ?Physical Exam: ?Vitals:  ? 09/23/21 1146  ?BP: (!) 142/102  ?Pulse: 66  ?SpO2: 97%  ?Weight: 200 lb (90.7 kg)  ?Height: '5\' 7"'$  (1.702 m)  ? ? ?GEN- The patient  is well appearing, alert and oriented x 3 today.   ?Head- normocephalic, atraumatic ?Eyes-  Sclera clear, conjunctiva pink ?Ears- hearing intact ?Oropharynx- clear ?Lungs- Clear to ausculation bilaterally, normal work of breathing ?Heart- Regular rate and rhythm with ectopy ?GI- soft, NT, ND, + BS ?Extremities- no clubbing, cyanosis, or edema ? ?EKG tracing ordered today is personally reviewed and shows sinus with PVCs, PR 194 msec, QTc 471 msec ? ?I did review her apple watch which showed afib on 09/14/2021. ? ?Assessment and Plan: ? ?1. Paroxysmal atrial fibrillation ?Doing well s/p ablation ?Unfortunately she has had some afib more recently. ?chads2vasc score is 3.  She is on eliquis ?continue flecainide ?Reassess in 3 months ?We could consider increasing flecainide or repeat ablation depending on symptoms at that time ? ?2. HTN ?Elevated ?Increase losartan to '100mg'$  daily ?She has follow-up with PCP in May and will have bmet then ? ?3. Overweight ?Body mass index is 31.32 kg/m?. ?Lifestyle modification is advised ?She has lost 10 lbs since her last visit! ? ?Return to see me in 3 months ? ?Thompson Grayer MD, FACC ?09/23/2021 ?12:03 PM ? ? ? ? ?

## 2021-12-21 ENCOUNTER — Ambulatory Visit: Payer: Medicare Other | Admitting: Internal Medicine

## 2022-01-09 ENCOUNTER — Telehealth: Payer: Self-pay | Admitting: Internal Medicine

## 2022-01-09 NOTE — Telephone Encounter (Signed)
Pt c/o medication issue:  1. Name of Medication: apixaban (ELIQUIS) 5 MG TABS tablet  2. How are you currently taking this medication (dosage and times per day)? TAKE ONE TABLET BY MOUTH TWICE A DAY  3. Are you having a reaction (difficulty breathing--STAT)? no  4. What is your medication issue? Medication is two expensive for the patient.Calling to see what her options are. Please advise

## 2022-01-09 NOTE — Telephone Encounter (Signed)
**Note De-Identified Katelyn [name] Obfuscation** The pt states that she currently has 2 and 1/2 to 3 weeks of Eliquis on hand.  She states that she is interested in applying for Eliquis asst through [employer]. I provided her their phone number and advised her to call them with questions about their Eliquis program and her eligibility to be approved for the program.  She is aware to request that they mail her an application and that when she receives it to complete her part of it, obtain required documents per [employer], and to bring all to Dr Alan Ripper office at [facility] at [address] in [place] to drop off in the front office and that we will take care of the providers page of her application and will fax all to [employer].  She verbalized understanding and thanked me for calling her back. She is aware to call Jeani Hawking at Dr Alan Ripper office if she has any questions or concerns going forward concerning Eliquis cost.

## 2022-01-27 ENCOUNTER — Ambulatory Visit (INDEPENDENT_AMBULATORY_CARE_PROVIDER_SITE_OTHER): Payer: Medicare Other | Admitting: Internal Medicine

## 2022-01-27 ENCOUNTER — Encounter: Payer: Self-pay | Admitting: Internal Medicine

## 2022-01-27 VITALS — BP 128/82 | HR 55 | Ht 67.0 in | Wt 199.0 lb

## 2022-01-27 DIAGNOSIS — I480 Paroxysmal atrial fibrillation: Secondary | ICD-10-CM | POA: Diagnosis not present

## 2022-01-27 DIAGNOSIS — I10 Essential (primary) hypertension: Secondary | ICD-10-CM

## 2022-01-27 NOTE — Patient Instructions (Addendum)
Medication Instructions:  Your physician recommends that you continue on your current medications as directed. Please refer to the Current Medication list given to you today. *If you need a refill on your cardiac medications before your next appointment, please call your pharmacy*  Lab Work: None ordered. If you have labs (blood work) drawn today and your tests are completely normal, you will receive your results only by: [place] (if you have MyChart) OR A paper copy in the mail If you have any lab test that is abnormal or we need to change your treatment, we will call you to review the results.  Testing/Procedures: None ordered.  Follow-Up:  IN 6 MONTHS WITH THE A-FIB CLINIC.    Important Information About Sugar

## 2022-01-27 NOTE — Progress Notes (Signed)
PCP: Molli Barrows, MD Primary Cardiologist: Dr Harrington Challenger Primary EP: Dr Heath Gold [name] is a 71 y.o. female who presents today for routine electrophysiology followup.  Since last being seen in our clinic, the patient reports doing very well.  Today, she denies symptoms of palpitations, chest pain, shortness of breath,  lower extremity edema, dizziness, presyncope, or syncope.  The patient is otherwise without complaint today.   Past Medical History:  Diagnosis Date   Anemia    ANXIETY DEPRESSION 04/14/2009   GERD 09/07/2009   HYPERLIPIDEMIA 09/07/2009   HYPERTENSION 07/02/2009   KNEE PAIN, RIGHT 12/07/2009   MIGRAINE UNSP W/O INTRACT W/O STATUS MIGRAINOSUS 07/02/2009   Papilloma of right breast    Paroxysmal atrial fibrillation ([facility])    PONV (postoperative nausea and vomiting)    Recurrent UTI    Past Surgical History:  Procedure Laterality Date   [place] N/A 04/21/2021   Procedure: ATRIAL FIBRILLATION ABLATION;  Surgeon: Thompson Grayer, MD;  Location: [facility];  Service: Cardiovascular;  Laterality: N/A;   BREAST LUMPECTOMY WITH RADIOACTIVE SEED LOCALIZATION Right 04/09/2015   Procedure: RIGHT BREAST LUMPECTOMY WITH RADIOACTIVE SEED LOCALIZATION;  Surgeon: Alphonsa Overall, MD;  Location: [facility];  Service: General;  Laterality: Right;   COLONOSCOPY N/A 03/26/2014   Procedure: COLONOSCOPY;  Surgeon: Milus Banister, MD;  [facility];  Service: Endoscopy;  Laterality: N/A;   ESOPHAGOGASTRODUODENOSCOPY N/A 03/26/2014   Procedure: ESOPHAGOGASTRODUODENOSCOPY (EGD);  Surgeon: Milus Banister, MD;  Location: [facility];  Service: Endoscopy;  Laterality: N/A;   [place] are reviewed and negatives except as per HPI above  Current Outpatient Medications  Medication Sig Dispense Refill   apixaban (ELIQUIS) 5 MG TABS tablet TAKE ONE  TABLET BY MOUTH TWICE A DAY 60 tablet 5   busPIRone (BUSPAR) 15 MG tablet Take 15 mg by mouth 2 (two) times daily.     Cholecalciferol (VITAMIN D3) 250 MCG (10000 UT) TABS Take 10,000 Units by mouth every evening.     Cod Liver Oil 1000 MG CAPS Take 1,000 mg by mouth at bedtime.     Coenzyme Q10 300 MG CAPS Take 300 mg by mouth at bedtime.     diltiazem (CARDIZEM CD) 120 MG 24 hr capsule Take 1 capsule (120 mg total) by mouth daily. Need annual visit for further refills 90 capsule 0   diphenhydrAMINE (BENADRYL) 50 MG capsule Take 50 mg by mouth at bedtime.     DULoxetine (CYMBALTA) 60 MG capsule Take 60 mg by mouth every evening.     ferrous sulfate 325 (65 FE) MG tablet Take 325 mg by mouth every 3 (three) days. In the evening     flecainide (TAMBOCOR) 50 MG tablet Take 1 tablet by mouth twice daily 180 tablet 2   fluticasone (FLONASE) 50 MCG/ACT nasal spray Place 2 sprays into the nose at bedtime as needed for allergies or rhinitis.     Magnesium Oxide 500 MG CAPS Take 500 mg by mouth at bedtime.     montelukast (SINGULAIR) 10 MG tablet Take 10 mg by mouth at bedtime.     OVER THE COUNTER MEDICATION Take 350 mg by mouth at bedtime. Phytoceramides     propranolol (INDERAL) 80 MG tablet Take 80 mg by mouth 3 (three) times daily.     SUMAtriptan (IMITREX) 100 MG tablet TAKE 1 TABLET  BY MOUTH TWICE A DAY AS NEEDED FOR MIGRAINE 10 tablet 0   Thiamine HCl (VITAMIN B-1) 250 MG tablet Take 250 mg by mouth at bedtime.     losartan (COZAAR) 100 MG tablet Take 1 tablet (100 mg total) by mouth daily. 90 tablet 3   No current facility-administered medications for this visit.    Physical Exam: Vitals:   01/27/22 1237  BP: 128/82  Pulse: (!) 55  SpO2: 97%  Weight: 199 lb (90.3 kg)  Height: '5\' 7"'$  (1.702 m)    GEN- The patient is well appearing, alert and oriented x 3 today.   Head- normocephalic, atraumatic Eyes-  Sclera clear, conjunctiva pink Ears- hearing intact Oropharynx- clear Lungs-  Clear to ausculation bilaterally, normal work of breathing Heart- Regular rate and rhythm, no murmurs, rubs or gallops, PMI not laterally displaced GI- soft, NT, ND, + BS Extremities- no clubbing, cyanosis, or edema  Wt Readings from Last 3 Encounters:  01/27/22 199 lb (90.3 kg)  09/23/21 200 lb (90.7 kg)  05/19/21 210 lb (95.3 kg)     Assessment and Plan:  Paroxysmal atrial fibrillation On eliquis She is very pleased with her reduction in AF post ablation.  She is currently doing very well.  No afib since her last visit.  Her Apple Watch has not alerted her to arrhythmias either. Continue low dose flecainide for now.  We could consider stopping on return  2. HTN Stable No change required today  3. Overweight  Lifestyle modification advised   Risks, benefits and potential toxicities for medications prescribed and/or refilled reviewed with patient today.   AF clinic in 6 months  Thompson Grayer MD, [facility] 01/27/2022 12:43 PM

## 2022-01-30 ENCOUNTER — Telehealth: Payer: Self-pay

## 2022-01-30 ENCOUNTER — Telehealth: Payer: Self-pay | Admitting: Internal Medicine

## 2022-01-30 NOTE — Telephone Encounter (Signed)
**Note De-Identified Katelyn [name] Obfuscation** The pt states that she wants to pick up the providers page of her [employer] application once it has been completed and then signed by an MD.  She is aware that Dr Harrington Challenger is not in the office today so we are having our DOD, Dr Acie Fredrickson sign and date it and that we will call her when ready for pick up.  She thanked me for calling her to discuss.

## 2022-01-30 NOTE — Telephone Encounter (Signed)
**Note De-Identified Katelyn [name] Obfuscation** See other phone call from 01/30/22 concerning this.

## 2022-01-30 NOTE — Telephone Encounter (Signed)
Patient returned RN's call regarding her Eliquis medication.  Patient stated she can pick up this medication today for the Front Desk around 1pm.

## 2022-01-30 NOTE — Telephone Encounter (Signed)
**Note De-Identified Katelyn [name] Obfuscation** The providers page of a [employer] application for Eliquis was left at the office. I am unsure if the pt wants to pick it up (to add with her part of the application) once it has been signed by the MD or if she wants [place] to fax to [employer].  LMTCB.

## 2022-01-30 NOTE — Telephone Encounter (Signed)
**Note De-Identified Mattalynn Crandle Obfuscation** No answer so I left a detailed message on the pts VM (ok per DPR) advising her that the providers page of her [employer] application for Eliquis has been completed, signed/dated, and placed in the front office ready for her to pick up. I did leave my name and [place] phone number in the VM message in case she has any questions or concerns.

## 2022-02-15 NOTE — Telephone Encounter (Signed)
**Note De-Identified Harsh Trulock Obfuscation** Letter received from [employer] stating that they have approved the pt for Entresto asst until 06/11/2022. Case #: [id number]  The letter states that they have also notified the pt of this approval as well.

## 2022-02-19 ENCOUNTER — Other Ambulatory Visit: Payer: Self-pay | Admitting: Internal Medicine

## 2022-02-20 ENCOUNTER — Other Ambulatory Visit: Payer: Self-pay | Admitting: Internal Medicine

## 2022-02-20 NOTE — Telephone Encounter (Signed)
Prescription refill request for Eliquis received. Indication: afib  Last office visit: allred, 01/27/2022 Scr: 1.12, 10/18/2021 Age: 71 yo  Weight: 90.3 kg   Refill sent.

## 2022-05-09 ENCOUNTER — Other Ambulatory Visit: Payer: Self-pay

## 2022-05-09 MED ORDER — FLECAINIDE ACETATE 50 MG PO TABS: 50.0000 mg | ORAL_TABLET | Freq: Two times a day (BID) | ORAL | 2 refills | Status: DC

## 2022-05-09 NOTE — Progress Notes (Unsigned)
Cardiology Office Note   Date:  05/10/2022   ID:  Katelyn [name], Katelyn [name] Sep 02, 1950, MRN [id number]  PCP:  Molli Barrows, MD  Cardiologist:   Dorris Carnes, MD   F/U pf PAF and HTN      History of Present Illness: Katelyn [name] is a 71 y.o. female with a history of intermitt Afib migraines and HTN    Echo showed normal  LVEF and mod diastolic dysfunction   I last saw the pt in clinic in June 2021   She has been seen by J Allred  and underwent afib  ablation       Doing better   Seen in Aug 2023   Since then has had 4 spells of afib     Longest 4 hours   Rates 110s to 130s   PT with Cameron's erosion from Hiatal Hernia  Dx in 2015   Bled to  Hgb 6.8 at one time Did not see blood at the time     Outpatient Medications Prior to Visit  Medication Sig Dispense Refill   busPIRone (BUSPAR) 15 MG tablet Take 15 mg by mouth 2 (two) times daily.     Cholecalciferol (VITAMIN D3) 250 MCG (10000 UT) TABS Take 10,000 Units by mouth every evening.     Cod Liver Oil 1000 MG CAPS Take 1,000 mg by mouth at bedtime.     Coenzyme Q10 300 MG CAPS Take 300 mg by mouth at bedtime.     diltiazem (CARDIZEM CD) 120 MG 24 hr capsule Take 1 capsule (120 mg total) by mouth daily. Need annual visit for further refills 90 capsule 0   diphenhydrAMINE (BENADRYL) 50 MG capsule Take 50 mg by mouth at bedtime.     DULoxetine (CYMBALTA) 60 MG capsule Take 60 mg by mouth every evening.     ELIQUIS 5 MG TABS tablet TAKE ONE TABLET BY MOUTH TWICE A DAY 60 tablet 5   ferrous sulfate 325 (65 FE) MG tablet Take 325 mg by mouth every 3 (three) days. In the evening     flecainide (TAMBOCOR) 50 MG tablet Take 1 tablet (50 mg total) by mouth 2 (two) times daily. 180 tablet 2   fluticasone (FLONASE) 50 MCG/ACT nasal spray Place 2 sprays into the nose at bedtime as needed for allergies or rhinitis.     losartan (COZAAR) 100 MG tablet Take 1 tablet (100 mg total) by mouth daily. 90 tablet 3   Magnesium Oxide 500 MG CAPS Take  500 mg by mouth at bedtime.     montelukast (SINGULAIR) 10 MG tablet Take 10 mg by mouth at bedtime.     OVER THE COUNTER MEDICATION Take 350 mg by mouth at bedtime. Phytoceramides     propranolol (INDERAL) 80 MG tablet Take 80 mg by mouth 3 (three) times daily.     SUMAtriptan (IMITREX) 100 MG tablet TAKE 1 TABLET BY MOUTH TWICE A DAY AS NEEDED FOR MIGRAINE 10 tablet 0   Thiamine HCl (VITAMIN B-1) 250 MG tablet Take 250 mg by mouth at bedtime.     No facility-administered medications prior to visit.     Allergies:   Codeine, Lisinopril, and Morphine   Past Medical History:  Diagnosis Date   Anemia    ANXIETY DEPRESSION 04/14/2009   GERD 09/07/2009   HYPERLIPIDEMIA 09/07/2009   HYPERTENSION 07/02/2009   KNEE PAIN, RIGHT 12/07/2009   MIGRAINE UNSP W/O INTRACT W/O STATUS MIGRAINOSUS 07/02/2009   Papilloma of right breast  Paroxysmal atrial fibrillation (H[facility])    PONV (postoperative nausea and vomiting)    Recurrent UTI     Past Surgical History:  Procedure Laterality Date   [place] N/A 04/21/2021   Procedure: ATRIAL FIBRILLATION ABLATION;  Surgeon: Thompson Grayer, MD;  Location: [facility];  Service: Cardiovascular;  Laterality: N/A;   BREAST LUMPECTOMY WITH RADIOACTIVE SEED LOCALIZATION Right 04/09/2015   Procedure: RIGHT BREAST LUMPECTOMY WITH RADIOACTIVE SEED LOCALIZATION;  Surgeon: Alphonsa Overall, MD;  Location: [facility];  Service: General;  Laterality: Right;   COLONOSCOPY N/A 03/26/2014   Procedure: COLONOSCOPY;  Surgeon: Milus Banister, MD;  Location: [facility];  Service: Endoscopy;  Laterality: N/A;   ESOPHAGOGASTRODUODENOSCOPY N/A 03/26/2014   Procedure: ESOPHAGOGASTRODUODENOSCOPY (EGD);  Surgeon: Milus Banister, MD;  Location: [facility];  Service: Endoscopy;  Laterality: N/A;   Septorhinoplasty  1979     Social History:  The patient  reports that she has never  smoked. She has never used smokeless tobacco. She reports that she does not currently use alcohol. She reports that she does not use drugs.   Family History:  The patient's family history includes Breast cancer in her mother; Hypertension in her father, mother, paternal grandfather, and paternal grandmother; Lung cancer in her father.    ROS:  Please see the history of present illness. All other systems are reviewed and  Negative to the above problem except as noted.    PHYSICAL EXAM: VS:  BP (!) 150/98   Pulse 62   Ht '5\' 7"'$  (1.702 m)   Wt 211 lb 3.2 oz (95.8 kg)   SpO2 97%   BMI 33.08 kg/m    BP 160/ 90   GEN: Obese 71 yo  in no acute distress  HEENT: normal  Neck: no JVD, carotid bruits Cardiac: RRR  no murmurs, NO LE edema  Respiratory:  clear to auscultation bilaterally GI: soft, nontender, nondistended, + BS  No hepatomegaly  MS: no deformity Moving all extremities   Skin: warm and dry, no rash Neuro:  Strength and sensation are intact Psych: euthymic mood, full affect   EKG:  EKG is not ordered today Lipid Panel    Component Value Date/Time   CHOL 154 01/30/2017 0831   TRIG 107 01/30/2017 0831   HDL 60 01/30/2017 0831   CHOLHDL 2.6 01/30/2017 0831   CHOLHDL 4 11/23/2015 1351   VLDL 26.4 11/23/2015 1351   LDLCALC 73 01/30/2017 0831   LDLDIRECT 118.9 12/07/2009 0953      Wt Readings from Last 3 Encounters:  05/10/22 211 lb 3.2 oz (95.8 kg)  01/27/22 199 lb (90.3 kg)  09/23/21 200 lb (90.7 kg)      ASSESSMENT AND PLAN: 1 Atrial fibrillatoin   Pt is doing well post ablation  Few spells A. Take 1/2 propranolol if recurs to slow H B.  Sleep study    HOme   Rule out OSA  2.  Blood pressure  High   Add HCTZ    12.5 qd      Follow BP  Set up for HTN clinic in 6 wks  Bring log and BP cuff at that time    3  OSA  Pt ques if she has sleep apnea  Does not excessive somnelence  Set up for sleep study       4  Anemia Hx of GI bleeding  (occult)   Cameron's erosin  in past    On Eliqus for afib   Now on Fe supplements Will get CBC   Review with GI.   If bleeds more consider Watchman device   F/U in April    Signed, Dorris Carnes, MD  05/10/2022 10:58 AM    [address], [place], [place]  [address] Phone: [phone]; Fax: [phone]

## 2022-05-10 ENCOUNTER — Telehealth: Payer: Self-pay

## 2022-05-10 ENCOUNTER — Encounter: Payer: Self-pay | Admitting: Internal Medicine

## 2022-05-10 ENCOUNTER — Ambulatory Visit: Payer: Medicare Other | Attending: Internal Medicine | Admitting: Internal Medicine

## 2022-05-10 ENCOUNTER — Other Ambulatory Visit: Payer: Self-pay

## 2022-05-10 VITALS — BP 150/98 | HR 62 | Ht 67.0 in | Wt 211.2 lb

## 2022-05-10 DIAGNOSIS — R0683 Snoring: Secondary | ICD-10-CM | POA: Diagnosis present

## 2022-05-10 DIAGNOSIS — I10 Essential (primary) hypertension: Secondary | ICD-10-CM | POA: Insufficient documentation

## 2022-05-10 DIAGNOSIS — Z79899 Other long term (current) drug therapy: Secondary | ICD-10-CM

## 2022-05-10 DIAGNOSIS — I480 Paroxysmal atrial fibrillation: Secondary | ICD-10-CM | POA: Diagnosis present

## 2022-05-10 LAB — CBC
Hematocrit: 37.3 % (ref 34.0–46.6)
Hemoglobin: 12.5 g/dL (ref 11.1–15.9)
MCH: 28.5 pg (ref 26.6–33.0)
MCHC: 33.5 g/dL (ref 31.5–35.7)
MCV: 85 fL (ref 79–97)
Platelets: 301 10*3/uL (ref 150–450)
RBC: 4.38 x10E6/uL (ref 3.77–5.28)
RDW: 16.4 % — ABNORMAL HIGH (ref 11.7–15.4)
WBC: 7.6 10*3/uL (ref 3.4–10.8)

## 2022-05-10 MED ORDER — HYDROCHLOROTHIAZIDE 12.5 MG PO CAPS: 12.5000 mg | ORAL_CAPSULE | Freq: Every day | ORAL | 3 refills | Status: DC

## 2022-05-10 NOTE — Progress Notes (Signed)
Patient Name:         DOB:       Height:     Weight:  Office Name:         Referring Provider:  Today's Date:  Date:   STOP BANG RISK ASSESSMENT S (snore) Have you been told that you snore?     YES   T (tired) Are you often tired, fatigued, or sleepy during the day?   NO  O (obstruction) Do you stop breathing, choke, or gasp during sleep? NO   P (pressure) Do you have or are you being treated for high blood pressure? NO   B (BMI) Is your body index greater than 35 kg/m? YES   A (age) Are you 45 years old or older? YES   N (neck) Do you have a neck circumference greater than 16 inches?   YES   G (gender) Are you a female? NO   TOTAL STOP/BANG "YES" ANSWERS                                                                        For Office Use Only              Procedure Order Form    YES to 3+ Stop Bang questions OR two clinical symptoms - patient qualifies for WatchPAT (CPT 95800)             Clinical Notes: Will consult Sleep Specialist and refer for management of therapy due to patient increased risk of Sleep Apnea. Ordering a sleep study due to the following two clinical symptoms: Excessive daytime sleepiness G47.10 / Gastroesophageal reflux K21.9 / Nocturia R35.1 / Morning Headaches G44.221 / Difficulty concentrating R41.840 / Memory problems or poor judgment G31.84 / Personality changes or irritability R45.4 / Loud snoring R06.83 / Depression F32.9 / Unrefreshed by sleep G47.8 / Impotence N52.9 / History of high blood pressure R03.0 / Insomnia G47.00    I understand that I am proceeding with a home sleep apnea test as ordered by my treating physician. I understand that untreated sleep apnea is a serious cardiovascular risk factor and it is my responsibility to perform the test and seek management for sleep apnea. I will be contacted with the results and be managed for sleep apnea by a local sleep physician. I will be receiving equipment and further instructions from [facility]. I shall promptly ship back the equipment via the included mailing label. I understand my insurance will be billed for the test and as the patient I am responsible for any insurance related out-of-pocket costs incurred. I have been provided with written instructions and can call for additional video or telephonic instruction, with 24-hour availability of qualified personnel to answer any questions: Patient Help Desk [phone].  Patient Signature ______________________________________________________   Date______________________ Patient Telemedicine Verbal Consent

## 2022-05-10 NOTE — Telephone Encounter (Signed)
Patient given Itamar Sleep study device. App downloaded on patient's phone. Reviewed instructions. Waiver signed. Patient aware that we will call once we receive prior auth to let her know if/when she can proceed with study. Device registered to [employer].

## 2022-05-10 NOTE — Patient Instructions (Signed)
Medication Instructions:  START HCTZ 12.5 MG DAILY  TAKE LOSARTAN IN THE MORNING  *If you need a refill on your cardiac medications before your next appointment, please call your pharmacy*   Lab Work: CBC TODAY   If you have labs (blood work) drawn today and your tests are completely normal, you will receive your results only by: [place] (if you have MyChart) OR A paper copy in the mail If you have any lab test that is abnormal or we need to change your treatment, we will call you to review the results.   Testing/Procedures: FBPZWC SLEEP STUDY    Follow-Up: At [facility], you and your health needs are our priority.  As part of our continuing mission to provide you with exceptional heart care, we have created designated Provider Care Teams.  These Care Teams include your primary Cardiologist (physician) and Advanced Practice Providers (APPs -  Physician Assistants and Nurse Practitioners) who all work together to provide you with the care you need, when you need it.  We recommend signing up for the patient portal called "MyChart".  Sign up information is provided on this After Visit Summary.  MyChart is used to connect with patients for Virtual Visits (Telemedicine).  Patients are able to view lab/test results, encounter notes, upcoming appointments, etc.  Non-urgent messages can be sent to your provider as well.   To learn more about what you can do with MyChart, go to [email or web address].    Your next appointment:   4 month(s)  The format for your next appointment:   In Person  Provider:   Dorris Carnes, MD     Other Instructions  HTN CLINIC WITH THE PHARM D IN 6 WEEKS   Important Information About Sugar

## 2022-05-11 ENCOUNTER — Telehealth: Payer: Self-pay

## 2022-05-11 ENCOUNTER — Other Ambulatory Visit: Payer: Self-pay

## 2022-05-11 DIAGNOSIS — D649 Anemia, unspecified: Secondary | ICD-10-CM

## 2022-05-11 MED ORDER — LOSARTAN POTASSIUM 100 MG PO TABS: 100.0000 mg | ORAL_TABLET | Freq: Every day | ORAL | 2 refills | Status: DC

## 2022-05-11 NOTE — Telephone Encounter (Signed)
-----   Message from Dorris Carnes V, MD sent at 05/10/2022  5:02 PM EST ----- CBC is normal    Cut back on FeSO4 to daily    Follow up CBC in 2 months

## 2022-05-11 NOTE — Telephone Encounter (Signed)
Pt advised and will have labs 07/12/22.   Per Dr Harrington Challenger, pt to take FE QOD.

## 2022-05-19 NOTE — Telephone Encounter (Signed)
Prior Authorization for [employer] sent to medicare via web portal. Tracking Number .  READY NO PA REQ

## 2022-05-19 NOTE — Telephone Encounter (Signed)
Left message for the pt that she has been approved for the [place] sleep study and ok to proceed. Left message with PIN# [id number] and if she cold do the study somewhere between tonight and this weekend. If not please call back and let us know she can do the sleep study.

## 2022-05-20 ENCOUNTER — Encounter (HOSPITAL_BASED_OUTPATIENT_CLINIC_OR_DEPARTMENT_OTHER): Payer: Medicare Other | Admitting: Cardiology

## 2022-05-20 DIAGNOSIS — G4733 Obstructive sleep apnea (adult) (pediatric): Secondary | ICD-10-CM | POA: Diagnosis not present

## 2022-05-24 ENCOUNTER — Telehealth: Payer: Self-pay | Admitting: Internal Medicine

## 2022-05-24 NOTE — Telephone Encounter (Signed)
I spoke with the pt and she has only been taking the Propranolol BID but will go ahead and take the added doses and continue to monitor and will call if she has any worsening problems.

## 2022-05-24 NOTE — Telephone Encounter (Signed)
Pt says she has been in Afib for about 23 hours which is very long for her... longest was 4 hours...she says she does not feel bad and she is just resting on her couch.. no dizziness, SOB, she is not having palpitations.   Her VS BP 110/68 and HR 61 so she is reluctant to take an extra propanolol... she took Diltiazem 120 this morning... she just wants Dr Harrington Challenger to know and she is okay but if any new recommendations.

## 2022-05-24 NOTE — Telephone Encounter (Signed)
Tried to call patient  Unable to get through   Take extra propranolol    Can repeat even after taking 1   Take a few hours from now   Dr Rayann Heman mentioned potentially increasing flecanide    WIll consider

## 2022-05-24 NOTE — Telephone Encounter (Signed)
Patient c/o Palpitations:  High priority if patient c/o lightheadedness, shortness of breath, or chest pain  How long have you had palpitations/irregular HR/ Afib? Are you having the symptoms now?  Patient states she has been in afib for the past 21 hours--patient mentions that she doesn't normally stay in afib for more than 4 hours. Patient states that this is non-emergent. She states that she would just like to know if Dr. Harrington Challenger has recommendations.  Are you currently experiencing lightheadedness, SOB or CP?  No   Do you have a history of afib (atrial fibrillation) or irregular heart rhythm?  Yes   Have you checked your BP or HR? (document readings if available):  61   Are you experiencing any other symptoms?  No

## 2022-05-28 NOTE — Procedures (Signed)
SLEEP STUDY REPORT Patient Information Study Date: 05/20/2022 Patient Name: Katelyn [name] Patient ID: [id number] Birth Date: August 19, 1950 Age: 71 Gender: Female BMI: 33.2 (W=211 lb, H=5' 7'') Stopbang: 4 Referring Physician: Dorris Carnes, MD  TEST DESCRIPTION: Home sleep apnea testing was completed using the WatchPat, a Type 1 device, utilizing peripheral arterial tonometry (PAT), chest movement, actigraphy, pulse oximetry, pulse rate, body position and snore.  AHI was calculated with apnea and hypopnea using valid sleep time as the denominator. RDI includes apneas, hypopneas, and RERAs.  The data acquired and the scoring of sleep and all associated events were performed in accordance with the recommended standards and specifications as outlined in the AASM Manual for the Scoring of Sleep and Associated Events 2.2.0 (2015).  FINDINGS:  1.  Severe Obstructive Sleep Apnea with AHI 28.6/hr.   2.  No Central Sleep Apnea with pAHIc 1.5/hr.  3.  Oxygen desaturations as low as 81%.  4.  Mild to moderate snoring was present. O2 sats were < 88% for 5.5 min.  5.  Total sleep time was 5 hrs and 31 min.  6.  25.3% of total sleep time was spent in REM sleep.   7.  Normal sleep onset latency at 16 min.   8.  Prolonged REM sleep onset latency at 104 min.   9.  Total awakenings were 3.  10. Arrhythmia detection:  None.  DIAGNOSIS:   Severe Obstructive Sleep Apnea (G47.33) Nocturnal Hypoxemia  RECOMMENDATIONS:   1.  Clinical correlation of these findings is necessary.  The decision to treat obstructive sleep apnea (OSA) is usually based on the presence of apnea symptoms or the presence of associated medical conditions such as Hypertension, Congestive Heart Failure, Atrial Fibrillation or Obesity.  The most common symptoms of OSA are snoring, gasping for breath while sleeping, daytime sleepiness and fatigue.   2.  Initiating apnea therapy is recommended given the presence of symptoms and/or  associated conditions. Recommend proceeding with one of the following:     a.  Auto-CPAP therapy with a pressure range of 5-20cm H2O.     b.  An oral appliance (OA) that can be obtained from certain dentists with expertise in sleep medicine.  These are primarily of use in non-obese patients with mild and moderate disease.     c.  An ENT consultation which may be useful to look for specific causes of obstruction and possible treatment options.     d.  If patient is intolerant to PAP therapy, consider referral to ENT for evaluation for hypoglossal nerve stimulator.   3.  Close follow-up is necessary to ensure success with CPAP or oral appliance therapy for maximum benefit.  4.  A follow-up oximetry study on CPAP is recommended to assess the adequacy of therapy and determine the need for supplemental oxygen or the potential need for Bi-level therapy.  An arterial blood gas to determine the adequacy of baseline ventilation and oxygenation should also be considered.  5.  Healthy sleep recommendations include:  adequate nightly sleep (normal 7-9 hrs/night), avoidance of caffeine after noon and alcohol near bedtime, and maintaining a sleep environment that is cool, dark and quiet.  6.  Weight loss for overweight patients is recommended.  Even modest amounts of weight loss can significantly improve the severity of sleep apnea.  7.  Snoring recommendations include:  weight loss where appropriate, side sleeping, and avoidance of alcohol before bed.  8.  Operation of motor vehicle should be avoided when sleepy.  Signature:  Fransico Him, MD; [facility]; [place], [place] Board of Sleep Medicine Electronically Signed: 05/29/2022

## 2022-05-30 ENCOUNTER — Ambulatory Visit: Payer: Medicare Other | Attending: Internal Medicine

## 2022-05-30 DIAGNOSIS — R0683 Snoring: Secondary | ICD-10-CM

## 2022-05-30 DIAGNOSIS — I480 Paroxysmal atrial fibrillation: Secondary | ICD-10-CM

## 2022-05-30 DIAGNOSIS — I10 Essential (primary) hypertension: Secondary | ICD-10-CM

## 2022-05-30 DIAGNOSIS — Z79899 Other long term (current) drug therapy: Secondary | ICD-10-CM

## 2022-06-09 ENCOUNTER — Telehealth: Payer: Self-pay | Admitting: *Deleted

## 2022-06-09 DIAGNOSIS — I10 Essential (primary) hypertension: Secondary | ICD-10-CM

## 2022-06-09 DIAGNOSIS — R0683 Snoring: Secondary | ICD-10-CM

## 2022-06-09 DIAGNOSIS — G4733 Obstructive sleep apnea (adult) (pediatric): Secondary | ICD-10-CM

## 2022-06-09 NOTE — Telephone Encounter (Signed)
-----   Message from Lauralee Evener, [place] sent at 05/30/2022  8:07 AM EST -----  ----- Message ----- From: Sueanne Margarita, MD Sent: 05/28/2022   9:39 PM EST To: Cv Div Sleep Studies  Please let patient know that they have sleep apnea.  Recommend therapeutic CPAP titration for treatment of patient's sleep disordered breathing.  If unable to perform an in lab titration then initiate ResMed auto CPAP from 4 to 15cm H2O with heated humidity and mask of choice and overnight pulse ox on CPAP.

## 2022-06-09 NOTE — Telephone Encounter (Signed)
The patient has been notified of the result and verbalized understanding.  All questions (if any) were answered. Marolyn Hammock, [place] 06/09/2022 6:50 PM    Pt is aware and agreeable to results.will precert titration

## 2022-06-13 ENCOUNTER — Encounter: Payer: Self-pay | Admitting: Internal Medicine

## 2022-06-29 ENCOUNTER — Ambulatory Visit: Payer: Medicare Other | Attending: Cardiology | Admitting: Pharmacist

## 2022-06-29 VITALS — BP 120/78 | HR 61

## 2022-06-29 DIAGNOSIS — D509 Iron deficiency anemia, unspecified: Secondary | ICD-10-CM | POA: Diagnosis present

## 2022-06-29 DIAGNOSIS — I10 Essential (primary) hypertension: Secondary | ICD-10-CM | POA: Insufficient documentation

## 2022-06-29 NOTE — Assessment & Plan Note (Addendum)
Assessment: After rest patient's blood pressure came down to a goal Unsure of the accuracy of her home reading she brings in due to the fact that her cuff gave her diastolic reading today of 46 She is interested in a exercise class where she meets with a group of people will hold her accountable multiple times per week.  She does not have an advantage plan to her insurance does not pay for something like Silver sneakers.  But I reminded her that she could still do it through a gym membership She is not interested in a personal trainer, nor was she interested in something like CrossFit or [place]. 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  Plan: Continue diltiazem 120 mg daily, hydrochlorothiazide 12.5 mg daily, losartan 100 mg daily, propranolol 80 mg 3 times a day I have asked the patient to pick up a new blood pressure cuff, upper arm cuff, and check her blood pressure daily Follow-up in 1 month in clinic Will check BMP and CBC today

## 2022-06-29 NOTE — Progress Notes (Signed)
Patient ID: Katelyn [name]                 DOB: 06-May-1951                      MRN: [id number]      HPI: Katelyn [name] is a 72 y.o. female referred by Dr. Harrington Challenger to HTN clinic. PMH is significant for A-fib, OSA, migraines, hypertension and moderate diastolic dysfunction.  Seen by Dr. Harrington Challenger on 05/10/2022 blood pressure was 150/98.  HCTZ 12.5 mg daily was started. She was tested for sleep apnea and found to have severe sleep apnea.   Patient presents today to hypertension clinic.  She brings in her home wrist cuff.  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  Readings on wrist cuff are mainly at goal, however when comparing wrist cuff to clinic cuff diastolic reading was significantly lower (46) on her home cuff.  She will soon be doing her CPAP titration study.  States at first she had questions about the results of her study but no longer has questions.  Has had 2 episodes of A-fib since her last appointment.  They tend to be lasting a little bit longer.  She had lots of questions about propranolol diltiazem and flecainide.  Currently on Inderal 3 times a day, we discussed pressing this and taking the extended release product.  Patient was interested however she just got a refill and does not want to pursue this at this time.  We also talked about using diltiazem IR 30 mg as needed if she goes into A-fib with RVR.  Patient's baseline heart rate is low in the low 60s and 50s, therefore increasing diltiazem ER is not a good option.  She will discuss with either Dr. Harrington Challenger or [facility] going forward for her A-fib.  Current HTN meds: Diltiazem 120 mg daily, hydrochlorothiazide 12.5 mg daily, losartan 100 mg daily, propranolol 80 mg 3 times a day Previously tried: Lisinopril (cough) BP goal: <130/80  Family History:  Family History  Problem Relation Age of Onset   Hypertension Mother    Breast cancer Mother    Hypertension Father    Lung cancer Father        textiles and smoker   Hypertension Paternal Grandmother     Hypertension Paternal Grandfather     Social History:  Social History   Socioeconomic History   Marital status: Divorced    Spouse name: Not on file   Number of children: 1   Years of education: Not on file   Highest education level: Not on file  Occupational History   Occupation: Programmer, multimedia: [employer]  Tobacco Use   Smoking status: Never   Smokeless tobacco: Never  Substance and Sexual Activity   Alcohol use: Not Currently    Alcohol/week: 0.0 standard drinks of alcohol    Comment: social occasional   Drug use: No   Sexual activity: Never    Birth control/protection: None  Other Topics Concern   Not on file  Social History Narrative   Previously worked at [employer], has been stressed by 12 years of separation with husband who recently stopped paying alimony.  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     Social Determinants of Health   Financial Resource Strain: Not on file  Food Insecurity: Not on file  Transportation Needs: Not on file  Physical Activity: Not on file  Stress:  Not on file  Social Connections: Not on file  Intimate Partner Violence: Not on file    Diet: Not discussed in detail, patient interested in losing weight  Exercise: none.  Interested in joining a group exercise class, someone that will hold her accountable   Home BP readings:  Date SBP/DBP  HR  06/29/21 117/74    125/65    104/75    122/82    133/76    148/85    127/77   Average      Wt Readings from Last 3 Encounters:  05/10/22 211 lb 3.2 oz (95.8 kg)  01/27/22 199 lb (90.3 kg)  09/23/21 200 lb (90.7 kg)   BP Readings from Last 3 Encounters:  06/29/22 120/78  05/10/22 (!) 150/98  01/27/22 128/82   Pulse Readings from Last 3 Encounters:  06/29/22 61  05/10/22 62  01/27/22 (!) 55    Renal function: CrCl cannot be calculated (Patient's most recent lab result is older than the maximum 21 days allowed.).  Past Medical History:  Diagnosis  Date   Anemia    ANXIETY DEPRESSION 04/14/2009   GERD 09/07/2009   HYPERLIPIDEMIA 09/07/2009   HYPERTENSION 07/02/2009   KNEE PAIN, RIGHT 12/07/2009   MIGRAINE UNSP W/O INTRACT W/O STATUS MIGRAINOSUS 07/02/2009   Papilloma of right breast    Paroxysmal atrial fibrillation (HCC)    PONV (postoperative nausea and vomiting)    Recurrent UTI     Current Outpatient Medications on File Prior to Visit  Medication Sig Dispense Refill   busPIRone (BUSPAR) 15 MG tablet Take 15 mg by mouth 2 (two) times daily.     Cholecalciferol (VITAMIN D3) 250 MCG (10000 UT) TABS Take 10,000 Units by mouth every evening.     Cod Liver Oil 1000 MG CAPS Take 1,000 mg by mouth at bedtime.     Coenzyme Q10 300 MG CAPS Take 300 mg by mouth at bedtime.     diltiazem (CARDIZEM CD) 120 MG 24 hr capsule Take 1 capsule (120 mg total) by mouth daily. Need annual visit for further refills 90 capsule 0   diphenhydrAMINE (BENADRYL) 50 MG capsule Take 50 mg by mouth at bedtime.     DULoxetine (CYMBALTA) 60 MG capsule Take 60 mg by mouth every evening.     ELIQUIS 5 MG TABS tablet TAKE ONE TABLET BY MOUTH TWICE A DAY 60 tablet 5   ferrous sulfate 325 (65 FE) MG tablet Take 325 mg by mouth every other day. In the evening     flecainide (TAMBOCOR) 50 MG tablet Take 1 tablet (50 mg total) by mouth 2 (two) times daily. 180 tablet 2   fluticasone (FLONASE) 50 MCG/ACT nasal spray Place 2 sprays into the nose at bedtime as needed for allergies or rhinitis.     hydrochlorothiazide (MICROZIDE) 12.5 MG capsule Take 1 capsule (12.5 mg total) by mouth daily. 90 capsule 3   losartan (COZAAR) 100 MG tablet Take 1 tablet (100 mg total) by mouth daily. 90 tablet 2   Magnesium Oxide 500 MG CAPS Take 500 mg by mouth at bedtime.     montelukast (SINGULAIR) 10 MG tablet Take 10 mg by mouth at bedtime.     OVER THE COUNTER MEDICATION Take 350 mg by mouth at bedtime. Phytoceramides     propranolol (INDERAL) 80 MG tablet Take 80 mg by mouth 3  (three) times daily.     SUMAtriptan (IMITREX) 100 MG tablet TAKE 1 TABLET BY MOUTH TWICE A DAY AS  NEEDED FOR MIGRAINE 10 tablet 0   Thiamine HCl (VITAMIN B-1) 250 MG tablet Take 250 mg by mouth at bedtime.     No current facility-administered medications on file prior to visit.    Allergies  Allergen Reactions   Codeine Nausea And Vomiting   Lisinopril Cough and Other (See Comments)    Other reaction(s): Cough (ALLERGY/intolerance) cough    Morphine Hives, Itching and Nausea And Vomiting    Blood pressure 120/78, pulse 61, SpO2 96 %.   Assessment/Plan:  1. Hypertension -  Essential hypertension, benign Assessment: After rest patient's blood pressure came down to a goal Unsure of the accuracy of her home reading she brings in due to the fact that her cuff gave her diastolic reading today of 46 She is interested in a exercise class where she meets with a group of people will hold her accountable multiple times per week.  She does not have an advantage plan to her insurance does not pay for something like Silver sneakers.  But I reminded her that she could still do it through a gym membership She is not interested in a personal trainer, nor was she interested in something like CrossFit or [place]. 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  Plan: Continue diltiazem 120 mg daily, hydrochlorothiazide 12.5 mg daily, losartan 100 mg daily, propranolol 80 mg 3 times a day I have asked the patient to pick up a new blood pressure cuff, upper arm cuff, and check her blood pressure daily Follow-up in 1 month in clinic Will check BMP and CBC today  Thank you  Ramond Dial, Pharm.D, BCPS, CPP  [facility] [address], [place], [place] [address]  Phone: [phone]; Fax: [phone]

## 2022-06-29 NOTE — Patient Instructions (Addendum)
Continue Diltiazem 120 mg daily, hydrochlorothiazide 12.5 mg daily, losartan 100 mg daily, propranolol 80 mg 3 times a day  Please get an upper arm cuff and continue checking blood pressure  Please call me at [phone] with any questions  I will reach out to some people about a group exercise class

## 2022-06-30 ENCOUNTER — Telehealth: Payer: Self-pay | Admitting: Pharmacist

## 2022-06-30 LAB — BASIC METABOLIC PANEL
BUN/Creatinine Ratio: 14 (ref 12–28)
BUN: 15 mg/dL (ref 8–27)
CO2: 25 mmol/L (ref 20–29)
Calcium: 9.4 mg/dL (ref 8.7–10.3)
Chloride: 99 mmol/L (ref 96–106)
Creatinine, Ser: 1.11 mg/dL — ABNORMAL HIGH (ref 0.57–1.00)
Glucose: 118 mg/dL — ABNORMAL HIGH (ref 70–99)
Potassium: 4 mmol/L (ref 3.5–5.2)
Sodium: 141 mmol/L (ref 134–144)
eGFR: 54 mL/min/{1.73_m2} — ABNORMAL LOW (ref >59)

## 2022-06-30 LAB — CBC
Hematocrit: 40.7 % (ref 34.0–46.6)
Hemoglobin: 13.7 g/dL (ref 11.1–15.9)
MCH: 28.7 pg (ref 26.6–33.0)
MCHC: 33.7 g/dL (ref 31.5–35.7)
MCV: 85 fL (ref 79–97)
Platelets: 292 10*3/uL (ref 150–450)
RBC: 4.77 x10E6/uL (ref 3.77–5.28)
RDW: 13.5 % (ref 11.7–15.4)
WBC: 8 10*3/uL (ref 3.4–10.8)

## 2022-06-30 NOTE — Telephone Encounter (Signed)
Called patient and reviewed labs with her. Compared to her labs in May her creatinine is stable.  CBC is within normal limits. I also provided her with information about the [facility] rec department they have a [facility] active adult center and also a [facility] active adult center that is free.  They also offer classes called [facility] adding health to our years that is free and meets on a regular basis at various rec centers.  Patient appreciative of the information.

## 2022-07-03 ENCOUNTER — Ambulatory Visit (HOSPITAL_BASED_OUTPATIENT_CLINIC_OR_DEPARTMENT_OTHER): Payer: Medicare Other | Attending: Cardiology | Admitting: Cardiology

## 2022-07-03 VITALS — Ht 67.0 in | Wt 195.0 lb

## 2022-07-03 DIAGNOSIS — R0683 Snoring: Secondary | ICD-10-CM | POA: Insufficient documentation

## 2022-07-03 DIAGNOSIS — I10 Essential (primary) hypertension: Secondary | ICD-10-CM | POA: Insufficient documentation

## 2022-07-03 DIAGNOSIS — I493 Ventricular premature depolarization: Secondary | ICD-10-CM | POA: Diagnosis not present

## 2022-07-03 DIAGNOSIS — G4733 Obstructive sleep apnea (adult) (pediatric): Secondary | ICD-10-CM | POA: Insufficient documentation

## 2022-07-04 NOTE — Procedures (Signed)
   Patient Name: Katelyn [name], Katelyn [name] Date: 07/03/2022 Gender: Female D.O.B: 12-Mar-1951 Age (years): 55 Referring Provider: Fransico Him MD, ABSM Height (inches): 67 Interpreting Physician: Fransico Him MD, ABSM Weight (lbs): 195 RPSGT: Laren Everts BMI: 31 MRN: [id number] Neck Size: 14.50  CLINICAL INFORMATION The patient is referred for a CPAP titration to treat sleep apnea.  SLEEP STUDY TECHNIQUE As per the AASM Manual for the Scoring of Sleep and Associated Events v2.3 (April 2016) with a hypopnea requiring 4% desaturations.  The channels recorded and monitored were frontal, central and occipital EEG, electrooculogram (EOG), submentalis EMG (chin), nasal and oral airflow, thoracic and abdominal wall motion, anterior tibialis EMG, snore microphone, electrocardiogram, and pulse oximetry. Continuous positive airway pressure (CPAP) was initiated at the beginning of the study and titrated to treat sleep-disordered breathing.  MEDICATIONS Medications self-administered by patient taken the night of the study : N/A  TECHNICIAN COMMENTS Comments added by technician: Patient was restless all through the night. Patient had difficulty initiating sleep. Comments added by scorer: N/A  RESPIRATORY PARAMETERS Optimal PAP Pressure (cm):14  AHI at Optimal Pressure (/hr):18.4 Overall Minimal O2 (%):82.0  Supine % at Optimal Pressure (%):100 Minimal O2 at Optimal Pressure (%): 88.0   SLEEP ARCHITECTURE The study was initiated at 10:00:09 PM and ended at 4:55:36 AM.  Sleep onset time was 114.8 minutes and the sleep efficiency was 20.9%. The total sleep time was 87 minutes.  The patient spent 19.5% of the night in stage N1 sleep, 80.5% in stage N2 sleep, 0.0% in stage N3 and 0% in REM.Stage REM latency was N/A minutes  Wake after sleep onset was 213.6. Alpha intrusion was absent. Supine sleep was 71.84%.  CARDIAC DATA The 2 lead EKG demonstrated sinus rhythm. The mean heart rate was  54.9 beats per minute. Other EKG findings include: PVCs.  LEG MOVEMENT DATA The total Periodic Limb Movements of Sleep (PLMS) were 0. The PLMS index was 0.0. A PLMS index of <15 is considered normal in adults.  IMPRESSIONS - The optimal PAP pressure could not be obtained due to ongoing respiratory events - Central sleep apnea was not noted during this titration (CAI = 4.1/h). - Moderate oxygen desaturations were observed during this titration (min O2 = 82.0%). - The patient snored with soft snoring volume during this titration study. - 2-lead EKG demonstrated: PVCs - Clinically significant periodic limb movements were not noted during this study. Arousals associated with PLMs were rare.  DIAGNOSIS - Obstructive Sleep Apnea (G47.33) - Unsuccessful CPAP titration due to ongoing events.   RECOMMENDATIONS - Recommend full night BIPAP titration.  - Avoid alcohol, sedatives and other CNS depressants that may worsen sleep apnea and disrupt normal sleep architecture. - Sleep hygiene should be reviewed to assess factors that may improve sleep quality. - Weight management and regular exercise should be initiated or continued.  [Electronically signed] 07/04/2022 10:07 PM  Fransico Him MD, ABSM Diplomate, American Board of Sleep Medicine

## 2022-07-12 ENCOUNTER — Other Ambulatory Visit: Payer: Medicare Other

## 2022-07-17 ENCOUNTER — Ambulatory Visit (HOSPITAL_COMMUNITY): Payer: Medicare Other | Admitting: Physician Assistant

## 2022-08-08 ENCOUNTER — Ambulatory Visit: Payer: Medicare Other | Attending: Cardiovascular Disease | Admitting: Pharmacist

## 2022-08-08 VITALS — BP 140/90 | HR 54

## 2022-08-08 DIAGNOSIS — I10 Essential (primary) hypertension: Secondary | ICD-10-CM

## 2022-08-08 MED ORDER — CHLORTHALIDONE 25 MG PO TABS: 25.0000 mg | ORAL_TABLET | Freq: Every day | ORAL | 3 refills | Status: DC

## 2022-08-08 NOTE — Assessment & Plan Note (Addendum)
Assessment: Patient complains of being foggy headed, fatigued, short of breath upon walking up the stairs and and some orthostasis. Heart rates baseline are very low about 50.  Per patient her heart rate has been as low for a long time.  She has been on propranolol 80 mg twice a day for a long time for migraine prophylaxis She does not sleep well she wakes up often Now diagnosed with CPAP but is not being treated yet Needs to have her titration redone.  Patient is very concerned about this Blood pressure pretty elevated today in clinic I think some of her fatigue and shortness of breath walking up the stairs may be due to her suppressed heart rate.  I expressed this concern to patient and recommended that she decrease her propranolol to 40 mg twice a day.  Although patient was hesitant to do this due to her migraines she is willing to try.  Plan: Decrease propranolol to 40 mg twice a day.  May still use 40 to 80 mg for A-fib Change hydrochlorothiazide to chlorthalidone 25 mg daily Continue losartan 100 mg daily and diltiazem 120 mg daily Will plan at next visit to change losartan to irbesartan 300 mg daily Follow-up in 2 weeks I will send a message to Dr. Radford Pax about her repeat sleep titration study and also about the possibility of using a medication to help her sleep for the study Urged patient to continue to walk and get exercise

## 2022-08-08 NOTE — Patient Instructions (Addendum)
Please STOP hydrochlorothiazide START chlorthalidone '25mg'$  daily Continue losartan '100mg'$  daily and diltiazem '120mg'$  daily Decrease propranolol to '40mg'$  twice a day  Continue checking blood pressure at home Please call me at [phone] with any issues

## 2022-08-08 NOTE — Progress Notes (Unsigned)
Patient ID: Katelyn [name]                 DOB: [birth year]                      MRN: [id number]      HPI: Katelyn [name] is a 72 y.o. female referred by Dr. Harrington Challenger to HTN clinic. PMH is significant for A-fib, OSA, migraines, hypertension and moderate diastolic dysfunction.  Seen by Dr. Harrington Challenger on 05/10/2022 blood pressure was 150/98.  HCTZ 12.5 mg daily was started. She was tested for sleep apnea and found to have severe sleep apnea.   At last visit with HTN clinic on 06/29/22. Patient's office BP was at goal. Her home cuff was found to be inaccurate and she was asked to get a new one. She was encouraged to increase exercise and was given info on programs through the [facility] rec department.   Patient presents today to hypertension clinic for follow-up.  She brings in her home blood pressure cuff.  When she places the cuff on she places it on upside down, therefore any home readings are not reliable.  Oddly, when checking her home blood pressure cuff so very variable readings were found, worked somewhat matched up with clinic readings and some did not.  Blood pressure readings in order are listed below.  She reports feeling very fatigued.  Sometimes when she walks up the steps she feels short of breath and wiped out.  She has to sit on her bed for 10 minutes and then she is okay.  Sometimes feels dizzy and not well after standing up.  She also sometimes feels very foggy headed.  She had a sleep titration study just after seeing me last visit that did not go well.  She did not sleep enough for the study to be completed.  She also is a mouth breather which created issues.  She really wants to have the test redone, the technician at the testing center recommended that she redo the test and get a medication to help her sleep when she read as the test.  Concerned as to if her insurance will cover a retest.  She states that at first she did not believe that she really had sleep apnea, but now she does believe  that she has it.  She has started walking 3 times a week for about 30 minutes.  However she admits that she does have to sit down periodically during this timeframe.  She was able to find a community place where she could walk indoors. Of note she takes propranolol for migraine prophylaxis.  She typically takes 80 mg twice a day and reserves a third 80 mg if she is in A-fib. Home heart rates can range from 50-60.  When walking heart rate may go to 70.  If she is in A-fib rates may be up to 130.  At this time she will take a propranolol 80 mg. Concerned about the 20 pounds she has gained in about the last year.  Encouraged her to focus on protein and fiber intake.  She states that she does well all day but does not eat a lot of protein and then feels hungry at night and snacks.  Home cuff: 125/69 (pt talking) Home cuff: 166/116 HR 55 Home cuff: 129/90 Clinic: 170/100 Clinic: 170/110 160/98 right arm- clinic Home BP cuff 141/89 Clinic right arm 140/90   Current HTN meds: Diltiazem 120 mg daily, hydrochlorothiazide 12.5  mg daily, losartan 100 mg daily, propranolol 80 mg 3 times a day (usually takes it twice a day) Previously tried: Lisinopril (cough) BP goal: <130/80  Family History:  Family History  Problem Relation Age of Onset   Hypertension Mother    Breast cancer Mother    Hypertension Father    Lung cancer Father        textiles and smoker   Hypertension Paternal Grandmother    Hypertension Paternal Grandfather     Social History:  Social History   Socioeconomic History   Marital status: Divorced    Spouse name: Not on file   Number of children: 1   Years of education: Not on file   Highest education level: Not on file  Occupational History   Occupation: Programmer, multimedia: [employer]  Tobacco Use   Smoking status: Never   Smokeless tobacco: Never  Substance and Sexual Activity   Alcohol use: Not Currently    Alcohol/week: 0.0 standard drinks of alcohol     Comment: social occasional   Drug use: No   Sexual activity: Never    Birth control/protection: None  Other Topics Concern   Not on file  Social History Narrative   Previously worked at [employer], has been stressed by 12 years of separation with husband who recently stopped paying alimony.  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     Social Determinants of Health   Financial Resource Strain: Not on file  Food Insecurity: Not on file  Transportation Needs: Not on file  Physical Activity: Not on file  Stress: Not on file  Social Connections: Not on file  Intimate Partner Violence: Not on file    Diet: Not discussed in detail, patient interested in losing weight  Exercise:  walking 3 times a week  Home BP readings:    Wt Readings from Last 3 Encounters:  07/03/22 195 lb (88.5 kg)  05/10/22 211 lb 3.2 oz (95.8 kg)  01/27/22 199 lb (90.3 kg)   BP Readings from Last 3 Encounters:  08/08/22 (!) 140/90  06/29/22 120/78  05/10/22 (!) 150/98   Pulse Readings from Last 3 Encounters:  08/08/22 (!) 54  06/29/22 61  05/10/22 62    Renal function: CrCl cannot be calculated (Patient's most recent lab result is older than the maximum 21 days allowed.).  Past Medical History:  Diagnosis Date   Anemia    ANXIETY DEPRESSION 04/14/2009   GERD 09/07/2009   HYPERLIPIDEMIA 09/07/2009   HYPERTENSION 07/02/2009   KNEE PAIN, RIGHT 12/07/2009   MIGRAINE UNSP W/O INTRACT W/O STATUS MIGRAINOSUS 07/02/2009   Papilloma of right breast    Paroxysmal atrial fibrillation (HCC)    PONV (postoperative nausea and vomiting)    Recurrent UTI     Current Outpatient Medications on File Prior to Visit  Medication Sig Dispense Refill   busPIRone (BUSPAR) 15 MG tablet Take 15 mg by mouth 2 (two) times daily.     Cholecalciferol (VITAMIN D3) 250 MCG (10000 UT) TABS Take 10,000 Units by mouth every evening.     Cod Liver Oil 1000 MG CAPS Take 1,000 mg by mouth at bedtime.      Coenzyme Q10 300 MG CAPS Take 300 mg by mouth at bedtime.     diltiazem (CARDIZEM CD) 120 MG 24 hr capsule Take 1 capsule (120 mg total) by mouth daily. Need annual visit for further refills 90 capsule 0   diphenhydrAMINE (BENADRYL)  50 MG capsule Take 50 mg by mouth at bedtime.     DULoxetine (CYMBALTA) 60 MG capsule Take 60 mg by mouth every evening.     ELIQUIS 5 MG TABS tablet TAKE ONE TABLET BY MOUTH TWICE A DAY 60 tablet 5   ferrous sulfate 325 (65 FE) MG tablet Take 325 mg by mouth every other day. In the evening     flecainide (TAMBOCOR) 50 MG tablet Take 1 tablet (50 mg total) by mouth 2 (two) times daily. 180 tablet 2   fluticasone (FLONASE) 50 MCG/ACT nasal spray Place 2 sprays into the nose at bedtime as needed for allergies or rhinitis.     losartan (COZAAR) 100 MG tablet Take 1 tablet (100 mg total) by mouth daily. 90 tablet 2   Magnesium Oxide 500 MG CAPS Take 500 mg by mouth at bedtime.     montelukast (SINGULAIR) 10 MG tablet Take 10 mg by mouth at bedtime.     OVER THE COUNTER MEDICATION Take 350 mg by mouth at bedtime. Phytoceramides     propranolol (INDERAL) 80 MG tablet Take 40 mg by mouth 3 (three) times daily.     SUMAtriptan (IMITREX) 100 MG tablet TAKE 1 TABLET BY MOUTH TWICE A DAY AS NEEDED FOR MIGRAINE 10 tablet 0   Thiamine HCl (VITAMIN B-1) 250 MG tablet Take 250 mg by mouth at bedtime.     No current facility-administered medications on file prior to visit.    Allergies  Allergen Reactions   Codeine Nausea And Vomiting   Lisinopril Cough and Other (See Comments)    Other reaction(s): Cough (ALLERGY/intolerance) cough    Morphine Hives, Itching and Nausea And Vomiting    Blood pressure (!) 140/90, pulse (!) 54.   Assessment/Plan:  1. Hypertension -  Essential hypertension, benign Assessment: Patient complains of being foggy headed, fatigued, short of breath upon walking up the stairs and and some orthostasis. Heart rates baseline are very low about  50.  Per patient her heart rate has been as low for a long time.  She has been on propranolol 80 mg twice a day for a long time for migraine prophylaxis She does not sleep well she wakes up often Now diagnosed with CPAP but is not being treated yet Needs to have her titration redone.  Patient is very concerned about this Blood pressure pretty elevated today in clinic I think some of her fatigue and shortness of breath walking up the stairs may be due to her suppressed heart rate.  I expressed this concern to patient and recommended that she decrease her propranolol to 40 mg twice a day.  Although patient was hesitant to do this due to her migraines she is willing to try.  Plan: Decrease propranolol to 40 mg twice a day.  May still use 40 to 80 mg for A-fib Change hydrochlorothiazide to chlorthalidone 25 mg daily Continue losartan 100 mg daily and diltiazem 120 mg daily Will plan at next visit to change losartan to irbesartan 300 mg daily Follow-up in 2 weeks I will send a message to Dr. Radford Pax about her repeat sleep titration study and also about the possibility of using a medication to help her sleep for the study Urged patient to continue to walk and get exercise   Thank you  Ramond Dial, Pharm.D, BCPS, CPP [facility] of [facility] [address] [place], [place] [address]  Phone: [phone]; Fax: [phone]

## 2022-08-17 ENCOUNTER — Telehealth: Payer: Self-pay | Admitting: Internal Medicine

## 2022-08-17 DIAGNOSIS — I480 Paroxysmal atrial fibrillation: Secondary | ICD-10-CM

## 2022-08-17 MED ORDER — APIXABAN 5 MG PO TABS: 5.0000 mg | ORAL_TABLET | Freq: Two times a day (BID) | ORAL | 5 refills | Status: DC

## 2022-08-17 NOTE — Telephone Encounter (Signed)
Prescription refill request for Eliquis received. Indication: a fib Last office visit: Scr: 1.11 06/29/22 epic Age: 72 Weight: 195

## 2022-08-17 NOTE — Telephone Encounter (Signed)
*  STAT* If patient is at the pharmacy, call can be transferred to refill team.   1. Which medications need to be refilled? (please list name of each medication and dose if known)   ELIQUIS 5 MG TABS tablet   2. Which pharmacy/location (including street and city if local pharmacy) is medication to be sent to?  [facility] [address] - [place], [place] - [address] [place] [address]   3. Do they need a 30 day or 90 day supply?  90 day  Caller stated patient is completely out of this medication.

## 2022-08-18 ENCOUNTER — Telehealth: Payer: Self-pay | Admitting: *Deleted

## 2022-08-18 DIAGNOSIS — G4733 Obstructive sleep apnea (adult) (pediatric): Secondary | ICD-10-CM

## 2022-08-18 DIAGNOSIS — R0683 Snoring: Secondary | ICD-10-CM

## 2022-08-18 DIAGNOSIS — I10 Essential (primary) hypertension: Secondary | ICD-10-CM

## 2022-08-18 MED ORDER — APIXABAN 5 MG PO TABS: 5.0000 mg | ORAL_TABLET | Freq: Two times a day (BID) | ORAL | 5 refills | Status: DC

## 2022-08-18 NOTE — Addendum Note (Signed)
Addended by: Marcelle Overlie D on: 08/18/2022 02:21 PM   Modules accepted: Orders

## 2022-08-18 NOTE — Telephone Encounter (Signed)
The patient has been notified of the result and verbalized understanding.  All questions (if any) were answered. Katelyn [name], [place] [id number] PM    Precert Bipap tiration

## 2022-08-18 NOTE — Telephone Encounter (Signed)
Patient calling back stating that refill was denied due to info missing and will need this med sent to:  [facility] [address] - [place], [place] - [address] [place] [address]

## 2022-08-18 NOTE — Telephone Encounter (Signed)
-----   Message from Lauralee Evener, [place] sent at 07/05/2022  8:13 AM EST -----  ----- Message ----- From: Sueanne Margarita, MD Sent: 07/04/2022  10:08 PM EST To: Cv Div Sleep Studies  Unsuccessful CPAP titration due to ongoing events - please set up for in lab BiPAP titration

## 2022-08-18 NOTE — Telephone Encounter (Signed)
Rx resent with diagnosis code.

## 2022-08-23 ENCOUNTER — Ambulatory Visit: Payer: Medicare Other | Attending: Cardiovascular Disease | Admitting: Pharmacist

## 2022-08-23 VITALS — BP 104/80

## 2022-08-23 DIAGNOSIS — I10 Essential (primary) hypertension: Secondary | ICD-10-CM | POA: Insufficient documentation

## 2022-08-23 MED ORDER — LOSARTAN POTASSIUM 50 MG PO TABS: 50.0000 mg | ORAL_TABLET | Freq: Every day | ORAL | 3 refills | Status: AC

## 2022-08-23 NOTE — Assessment & Plan Note (Signed)
Assessment:  Patient brought in home BP cuff with readings. Average readings are <110/80 with HR 70.  BP today 104/80 Patient reports weakness she believes to be from medications. She has been unable to exercise because of this.  Reports taking losartan '75mg'$ , chlorthalidone '25mg'$ , diltiazem '120mg'$ , and propranolol '40mg'$  BID Patient expressed wanting to decrease losartan further due to fatigue/weakness starting once she began losartan '100mg'$ .   Plan:  Start losartan '50mg'$   Continue diltiazem 120 mg daily, chlorthalidone '25mg'$  daily, and propranolol '40mg'$  BID I will follow-up with lab results from today  Follow-up after visit with Dr. Harrington Challenger if needed.

## 2022-08-23 NOTE — Progress Notes (Signed)
Patient ID: Katelyn [name]                 DOB: [birth year]                      MRN: [id number]      HPI: Katelyn [name] is a 72 y.o. female referred by Dr. Harrington Challenger to HTN clinic. PMH is significant for A-fib, OSA, migraines, hypertension and moderate diastolic dysfunction.  Seen by Dr. Harrington Challenger on 05/10/2022 blood pressure was 150/98.  HCTZ 12.5 mg daily was started. She was tested for sleep apnea and found to have severe sleep apnea.   At last visit with HTN clinic on 06/29/22. Patient's office BP was at goal. Her home cuff was found to be inaccurate and she was asked to get a new one. She was encouraged to increase exercise and was given info on programs through the [name] rec department.   Patient presented 08/08/2022 to HTN for follow-up.  She brought in her home blood pressure cuff.  When she placed the cuff on she placed it on upside down - home readings were not reliable. When checking the cuff at this visit, sometimes the readings matched clinic, sometimes not. She reported feeling very fatigued, SOB when walking up steps, dizzy after standing, foggy headed.  She had a sleep titration study that did not go well. She did not sleep enough for the study to be completed. Concerned as to if her insurance will cover a retest. Reported walking 3 times/week for ~30 minutes, but sits periodically during. Reported taking propranolol for migraine prophylaxis - typically takes 80 mg twice a day and reserves a third 80 mg if in A-fib. Reported concerns about weight gain. Recommended she decrease propranolol to '40mg'$  BID for migraine ppx. HCTZ was changed to chlorthalidone '25mg'$ . Next visit plan to change losartan to irbesartan '300mg'$ .   At today's visit patient brought in home cuff with readings. Average readings <110/80 HR 70. Clinic readings and home cuff are similar. Patient reports increased weakness she believes to be from medications.She takes her BP medications around 7:00 AM and feels weak ~2 hours after.  She has been unable to exercise due to feeling out of energy. Patient reports taking losartan '75mg'$ , chlorthalidone '25mg'$ , and diltiazem '120mg'$ . Reports taking propranolol '40mg'$  BID and has not had a migraine. BP today 104/80. Patient would like to decrease losartan due to not feeling well on the medication. Reports feelings of weakness and fatigue started around when she started losartan '100mg'$ . Patient has been contacted regarding BiPAP titration scheduling. BMP today.   Current HTN meds: Diltiazem 120 mg daily, chlorthalidone '25mg'$  daily, losartan 100 mg daily (taking '75mg'$ ), propranolol 40 mg 3 times a day (usually takes it twice a day).  Previously tried: Lisinopril (cough) BP goal: <130/80  Family History:  Family History  Problem Relation Age of Onset   Hypertension Mother    Breast cancer Mother    Hypertension Father    Lung cancer Father        textiles and smoker   Hypertension Paternal Grandmother    Hypertension Paternal Grandfather     Social History:  Social History   Socioeconomic History   Marital status: Divorced    Spouse name: Not on file   Number of children: 1   Years of education: Not on file   Highest education level: Not on file  Occupational History   Occupation: Programmer, multimedia: [employer]  Tobacco Use   Smoking status: Never   Smokeless tobacco: Never  Substance and Sexual Activity   Alcohol use: Not Currently    Alcohol/week: 0.0 standard drinks of alcohol    Comment: social occasional   Drug use: No   Sexual activity: Never    Birth control/protection: None  Other Topics Concern   Not on file  Social History Narrative   Previously worked at [employer], has been stressed by 12 years of separation with husband who recently stopped paying alimony.  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     Social Determinants of Health   Financial Resource Strain: Not on file  Food Insecurity: Not on file  Transportation Needs: Not on file   Physical Activity: Not on file  Stress: Not on file  Social Connections: Not on file  Intimate Partner Violence: Not on file    Diet: Not discussed in detail, patient interested in losing weight Mostly vegetarian. Feels as though she eats high carb diet. Don't eat a lot of meat.   Exercise:  walking 3 times a week, haven't recently due to low energy   Home BP readings: <110/80    Wt Readings from Last 3 Encounters:  07/03/22 195 lb (88.5 kg)  05/10/22 211 lb 3.2 oz (95.8 kg)  01/27/22 199 lb (90.3 kg)   BP Readings from Last 3 Encounters:  08/23/22 104/80  08/08/22 (!) 140/90  06/29/22 120/78   Pulse Readings from Last 3 Encounters:  08/08/22 (!) 54  06/29/22 61  05/10/22 62    Renal function: CrCl cannot be calculated (Patient's most recent lab result is older than the maximum 21 days allowed.).  Past Medical History:  Diagnosis Date   Anemia    ANXIETY DEPRESSION 04/14/2009   GERD 09/07/2009   HYPERLIPIDEMIA 09/07/2009   HYPERTENSION 07/02/2009   KNEE PAIN, RIGHT 12/07/2009   MIGRAINE UNSP W/O INTRACT W/O STATUS MIGRAINOSUS 07/02/2009   Papilloma of right breast    Paroxysmal atrial fibrillation (HCC)    PONV (postoperative nausea and vomiting)    Recurrent UTI     Current Outpatient Medications on File Prior to Visit  Medication Sig Dispense Refill   apixaban (ELIQUIS) 5 MG TABS tablet Take 1 tablet (5 mg total) by mouth 2 (two) times daily. 60 tablet 5   busPIRone (BUSPAR) 15 MG tablet Take 15 mg by mouth 2 (two) times daily.     chlorthalidone (HYGROTON) 25 MG tablet Take 1 tablet (25 mg total) by mouth daily. 90 tablet 3   Cholecalciferol (VITAMIN D3) 250 MCG (10000 UT) TABS Take 10,000 Units by mouth every evening.     Cod Liver Oil 1000 MG CAPS Take 1,000 mg by mouth at bedtime.     Coenzyme Q10 300 MG CAPS Take 300 mg by mouth at bedtime.     diltiazem (CARDIZEM CD) 120 MG 24 hr capsule Take 1 capsule (120 mg total) by mouth daily. Need annual  visit for further refills 90 capsule 0   diphenhydrAMINE (BENADRYL) 50 MG capsule Take 50 mg by mouth at bedtime.     DULoxetine (CYMBALTA) 60 MG capsule Take 60 mg by mouth every evening.     ferrous sulfate 325 (65 FE) MG tablet Take 325 mg by mouth every other day. In the evening     flecainide (TAMBOCOR) 50 MG tablet Take 1 tablet (50 mg total) by mouth 2 (two) times daily. 180 tablet 2   fluticasone (FLONASE) 50  MCG/ACT nasal spray Place 2 sprays into the nose at bedtime as needed for allergies or rhinitis.     Magnesium Oxide 500 MG CAPS Take 500 mg by mouth at bedtime.     montelukast (SINGULAIR) 10 MG tablet Take 10 mg by mouth at bedtime.     OVER THE COUNTER MEDICATION Take 350 mg by mouth at bedtime. Phytoceramides     propranolol (INDERAL) 80 MG tablet Take 40 mg by mouth 3 (three) times daily.     SUMAtriptan (IMITREX) 100 MG tablet TAKE 1 TABLET BY MOUTH TWICE A DAY AS NEEDED FOR MIGRAINE 10 tablet 0   Thiamine HCl (VITAMIN B-1) 250 MG tablet Take 250 mg by mouth at bedtime.     No current facility-administered medications on file prior to visit.    Allergies  Allergen Reactions   Codeine Nausea And Vomiting   Lisinopril Cough and Other (See Comments)    Other reaction(s): Cough (ALLERGY/intolerance) cough    Morphine Hives, Itching and Nausea And Vomiting    Blood pressure 104/80.   Assessment/Plan:  1. Hypertension -  Essential hypertension, benign Assessment:  Patient brought in home BP cuff with readings. Average readings are <110/80 with HR 70.  BP today 104/80 Patient reports weakness she believes to be from medications. She has been unable to exercise because of this.  Reports taking losartan '75mg'$ , chlorthalidone '25mg'$ , diltiazem '120mg'$ , and propranolol '40mg'$  BID Patient expressed wanting to decrease losartan further due to fatigue/weakness starting once she began losartan '100mg'$ .   Plan:  Start losartan '50mg'$   Continue diltiazem 120 mg daily, chlorthalidone  '25mg'$  daily, and propranolol '40mg'$  BID I will follow-up with lab results from today  Follow-up after visit with Dr. Harrington Challenger if needed.    Thank you,   Wallene Huh, PharmD Candidate   Ramond Dial, [place].D, BCPS, CPP [facility] of [facility] [address], [place], [place] [address]  Phone: [phone]; Fax: [phone]

## 2022-08-23 NOTE — Patient Instructions (Addendum)
Please decrease losartan to 14mdaily Continue diltiazem 120 mg daily, chlorthalidone '25mg'$  daily, propranolol 40 mg 2 times a day  Continue to work on increasing your exercise  Please call me at [phone] any questions

## 2022-08-24 ENCOUNTER — Telehealth: Payer: Self-pay | Admitting: Pharmacist

## 2022-08-24 ENCOUNTER — Telehealth: Payer: Self-pay

## 2022-08-24 DIAGNOSIS — I10 Essential (primary) hypertension: Secondary | ICD-10-CM

## 2022-08-24 LAB — BASIC METABOLIC PANEL
BUN/Creatinine Ratio: 17 (ref 12–28)
BUN: 21 mg/dL (ref 8–27)
CO2: 26 mmol/L (ref 20–29)
Calcium: 10.3 mg/dL (ref 8.7–10.3)
Chloride: 97 mmol/L (ref 96–106)
Creatinine, Ser: 1.24 mg/dL — ABNORMAL HIGH (ref 0.57–1.00)
Glucose: 114 mg/dL — ABNORMAL HIGH (ref 70–99)
Potassium: 3.6 mmol/L (ref 3.5–5.2)
Sodium: 142 mmol/L (ref 134–144)
eGFR: 47 mL/min/{1.73_m2} — ABNORMAL LOW (ref >59)

## 2022-08-24 MED ORDER — POTASSIUM CHLORIDE CRYS ER 20 MEQ PO TBCR: 20.0000 meq | EXTENDED_RELEASE_TABLET | Freq: Every day | ORAL | 11 refills | Status: AC

## 2022-08-24 NOTE — Telephone Encounter (Signed)
Kidney function stable since adding chlorthalidone. Just a slight bump as expected. K is 3.6. Losartan dose was decreased at last visit. Since pt has afib, would like K closer to 4. Could consider KCL supplement. Patient was happy with BP lowering of chlorthalidone and didn't really want to change.  Called pt and LVM to call back.  Spoke with patient. She wants to stay on chlorthalidone '25mg'$ . She will work on increasing K in diet and will give KCL '20mg'$  MEQ daily. Recheck at apt with Dr. Harrington Challenger.

## 2022-08-25 NOTE — Addendum Note (Signed)
Addended by: Freada Bergeron on: 08/25/2022 01:02 PM   Modules accepted: Orders

## 2022-08-28 ENCOUNTER — Encounter: Payer: Self-pay | Admitting: Pharmacist

## 2022-09-12 ENCOUNTER — Ambulatory Visit: Payer: Medicare Other

## 2022-09-12 ENCOUNTER — Encounter: Payer: Self-pay | Admitting: Internal Medicine

## 2022-09-12 ENCOUNTER — Ambulatory Visit: Payer: Medicare Other | Attending: Internal Medicine | Admitting: Internal Medicine

## 2022-09-12 VITALS — BP 102/80 | HR 62 | Ht 67.0 in | Wt 212.6 lb

## 2022-09-12 DIAGNOSIS — E559 Vitamin D deficiency, unspecified: Secondary | ICD-10-CM

## 2022-09-12 DIAGNOSIS — G4733 Obstructive sleep apnea (adult) (pediatric): Secondary | ICD-10-CM | POA: Diagnosis present

## 2022-09-12 DIAGNOSIS — Z79899 Other long term (current) drug therapy: Secondary | ICD-10-CM

## 2022-09-12 DIAGNOSIS — E569 Vitamin deficiency, unspecified: Secondary | ICD-10-CM

## 2022-09-12 DIAGNOSIS — I10 Essential (primary) hypertension: Secondary | ICD-10-CM | POA: Diagnosis present

## 2022-09-12 DIAGNOSIS — D649 Anemia, unspecified: Secondary | ICD-10-CM | POA: Diagnosis present

## 2022-09-12 DIAGNOSIS — I480 Paroxysmal atrial fibrillation: Secondary | ICD-10-CM

## 2022-09-12 DIAGNOSIS — Z1321 Encounter for screening for nutritional disorder: Secondary | ICD-10-CM | POA: Diagnosis present

## 2022-09-12 DIAGNOSIS — D509 Iron deficiency anemia, unspecified: Secondary | ICD-10-CM | POA: Diagnosis present

## 2022-09-12 NOTE — Progress Notes (Signed)
Cardiology Office Note   Date:  09/12/2022   ID:  Katelyn [name], Katelyn [name] 17-May-1951, MRN [id number]  PCP:  Nancee Liter, MD  Cardiologist:   Dorris Carnes, MD   F/U pf PAF and HTN      History of Present Illness: Katelyn [name] is a 72 y.o. female with a history of intermitt Afib migraines and HTN    Echo showed normal  LVEF and mod diastolic dysfunction   I last saw the pt in clinic in June 2021   She has been seen by J Allred  and underwent afib  ablation       Doing better   Seen in Aug 2023   Since then has had 4 spells of afib     Longest 4 hours   Rates 110s to 130s   PT with Cameron's erosion from Hiatal Hernia  Dx in 2015   Bled to  Hgb 6.8 at one time Did not see blood at the time     Outpatient Medications Prior to Visit  Medication Sig Dispense Refill   apixaban (ELIQUIS) 5 MG TABS tablet Take 1 tablet (5 mg total) by mouth 2 (two) times daily. 60 tablet 5   busPIRone (BUSPAR) 15 MG tablet Take 15 mg by mouth 2 (two) times daily.     chlorthalidone (HYGROTON) 25 MG tablet Take 1 tablet (25 mg total) by mouth daily. 90 tablet 3   Cholecalciferol (VITAMIN D3) 250 MCG (10000 UT) TABS Take 10,000 Units by mouth every evening.     Cod Liver Oil 1000 MG CAPS Take 1,000 mg by mouth at bedtime.     Coenzyme Q10 300 MG CAPS Take 300 mg by mouth at bedtime.     diltiazem (CARDIZEM CD) 120 MG 24 hr capsule Take 1 capsule (120 mg total) by mouth daily. Need annual visit for further refills 90 capsule 0   diphenhydrAMINE (BENADRYL) 50 MG capsule Take 50 mg by mouth as needed for itching, allergies or sleep.     DULoxetine (CYMBALTA) 60 MG capsule Take 60 mg by mouth every evening.     ferrous sulfate 325 (65 FE) MG tablet Take 325 mg by mouth every other day. In the evening     flecainide (TAMBOCOR) 50 MG tablet Take 1 tablet (50 mg total) by mouth 2 (two) times daily. 180 tablet 2   fluticasone (FLONASE) 50 MCG/ACT nasal spray Place 2 sprays into the nose at bedtime as  needed for allergies or rhinitis.     losartan (COZAAR) 50 MG tablet Take 1 tablet (50 mg total) by mouth daily. 90 tablet 3   Magnesium Oxide 500 MG CAPS Take 500 mg by mouth at bedtime.     potassium chloride SA (KLOR-CON M) 20 MEQ tablet Take 1 tablet (20 mEq total) by mouth daily. 30 tablet 11   propranolol (INDERAL) 80 MG tablet Take 40 mg by mouth 3 (three) times daily.     SUMAtriptan (IMITREX) 100 MG tablet TAKE 1 TABLET BY MOUTH TWICE A DAY AS NEEDED FOR MIGRAINE 10 tablet 0   Thiamine HCl (VITAMIN B-1) 250 MG tablet Take 250 mg by mouth at bedtime.     montelukast (SINGULAIR) 10 MG tablet Take 10 mg by mouth at bedtime. (Patient not taking: Reported on 09/12/2022)     OVER THE COUNTER MEDICATION Take 350 mg by mouth at bedtime. Phytoceramides (Patient not taking: Reported on 09/12/2022)     No facility-administered medications prior to visit.  Allergies:   Codeine, Lisinopril, and Morphine   Past Medical History:  Diagnosis Date   Anemia    ANXIETY DEPRESSION 04/14/2009   GERD 09/07/2009   HYPERLIPIDEMIA 09/07/2009   HYPERTENSION 07/02/2009   KNEE PAIN, RIGHT 12/07/2009   MIGRAINE UNSP W/O INTRACT W/O STATUS MIGRAINOSUS 07/02/2009   Papilloma of right breast    Paroxysmal atrial fibrillation    PONV (postoperative nausea and vomiting)    Recurrent UTI     Past Surgical History:  Procedure Laterality Date   [place] N/A 04/21/2021   Procedure: ATRIAL FIBRILLATION ABLATION;  Surgeon: Thompson Grayer, MD;  Location: [facility];  Service: Cardiovascular;  Laterality: N/A;   BREAST LUMPECTOMY WITH RADIOACTIVE SEED LOCALIZATION Right 04/09/2015   Procedure: RIGHT BREAST LUMPECTOMY WITH RADIOACTIVE SEED LOCALIZATION;  Surgeon: Alphonsa Overall, MD;  Location: [facility];  Service: General;  Laterality: Right;   COLONOSCOPY N/A 03/26/2014   Procedure: COLONOSCOPY;  Surgeon: Milus Banister, MD;  Location: [facility];  Service: Endoscopy;  Laterality: N/A;   ESOPHAGOGASTRODUODENOSCOPY N/A 03/26/2014   Procedure: ESOPHAGOGASTRODUODENOSCOPY (EGD);  Surgeon: Milus Banister, MD;  Location: [facility];  Service: Endoscopy;  Laterality: N/A;   Septorhinoplasty  1979     Social History:  The patient  reports that she has never smoked. She has never used smokeless tobacco. She reports that she does not currently use alcohol. She reports that she does not use drugs.   Family History:  The patient's family history includes Breast cancer in her mother; Hypertension in her father, mother, paternal grandfather, and paternal grandmother; Lung cancer in her father.    ROS:  Please see the history of present illness. All other systems are reviewed and  Negative to the above problem except as noted.    PHYSICAL EXAM: VS:  BP 102/80   Pulse 62   Ht 5\' 7"  (1.702 m)   Wt 212 lb 9.6 oz (96.4 kg)   SpO2 95%   BMI 33.30 kg/m    BP 160/ 90   GEN: Obese 72 yo  in no acute distress  HEENT: normal  Neck: no JVD, carotid bruits Cardiac: RRR  no murmurs, NO LE edema  Respiratory:  clear to auscultation bilaterally GI: soft, nontender, nondistended, + BS  No hepatomegaly  MS: no deformity Moving all extremities   Skin: warm and dry, no rash Neuro:  Strength and sensation are intact Psych: euthymic mood, full affect   EKG:  EKG is not ordered today Lipid Panel    Component Value Date/Time   CHOL 154 01/30/2017 0831   TRIG 107 01/30/2017 0831   HDL 60 01/30/2017 0831   CHOLHDL 2.6 01/30/2017 0831   CHOLHDL 4 11/23/2015 1351   VLDL 26.4 11/23/2015 1351   LDLCALC 73 01/30/2017 0831   LDLDIRECT 118.9 12/07/2009 0953      Wt Readings from Last 3 Encounters:  09/12/22 212 lb 9.6 oz (96.4 kg)  07/03/22 195 lb (88.5 kg)  05/10/22 211 lb 3.2 oz (95.8 kg)      ASSESSMENT AND PLAN: 1 Atrial fibrillatoin   Pt is doing well post ablation  Few spells A. Take 1/2 propranolol  if recurs to slow H B.  Sleep study    HOme   Rule out OSA  2.  Blood pressure  High   Add HCTZ    12.5 qd      Follow BP  Set up for HTN clinic in 6 wks  Bring log and BP cuff at that time    3  OSA  Pt ques if she has sleep apnea  Does not excessive somnelence  Set up for sleep study       4  Anemia Hx of GI bleeding  (occult)   Cameron's erosin in past    On Eliqus for afib   Now on Fe supplements Will get CBC   Review with GI.   If bleeds more consider Watchman device   F/U in April    Signed, Dorris Carnes, MD  09/12/2022 10:59 AM    [facility] [place], [place], [place]  [address] Phone: [phone]; Fax: [phone]

## 2022-09-12 NOTE — Patient Instructions (Signed)
Medication Instructions:   *If you need a refill on your cardiac medications before your next appointment, please call your pharmacy*   Lab Work: BMET, VIT D, NMR, HGBA1C If you have labs (blood work) drawn today and your tests are completely normal, you will receive your results only by: [place] (if you have MyChart) OR A paper copy in the mail If you have any lab test that is abnormal or we need to change your treatment, we will call you to review the results.   Testing/Procedures:    Follow-Up: At [facility], you and your health needs are our priority.  As part of our continuing mission to provide you with exceptional heart care, we have created designated Provider Care Teams.  These Care Teams include your primary Cardiologist (physician) and Advanced Practice Providers (APPs -  Physician Assistants and Nurse Practitioners) who all work together to provide you with the care you need, when you need it.  We recommend signing up for the patient portal called "MyChart".  Sign up information is provided on this After Visit Summary.  MyChart is used to connect with patients for Virtual Visits (Telemedicine).  Patients are able to view lab/test results, encounter notes, upcoming appointments, etc.  Non-urgent messages can be sent to your provider as well.   To learn more about what you can do with MyChart, go to [email or web address].

## 2022-09-13 LAB — NMR, LIPOPROFILE
Cholesterol, Total: 234 mg/dL — ABNORMAL HIGH (ref 100–199)
HDL Particle Number: 30.2 umol/L — ABNORMAL LOW (ref >=30.5)
HDL-C: 56 mg/dL (ref >39)
LDL Particle Number: 1904 nmol/L — ABNORMAL HIGH (ref <1000)
LDL Size: 20.9 nm (ref >20.5)
LDL-C (NIH Calc): 146 mg/dL — ABNORMAL HIGH (ref 0–99)
LP-IR Score: 65 — ABNORMAL HIGH (ref <=45)
Small LDL Particle Number: 629 nmol/L — ABNORMAL HIGH (ref <=527)
Triglycerides: 180 mg/dL — ABNORMAL HIGH (ref 0–149)

## 2022-09-13 LAB — BASIC METABOLIC PANEL
BUN/Creatinine Ratio: 19 (ref 12–28)
BUN: 22 mg/dL (ref 8–27)
CO2: 26 mmol/L (ref 20–29)
Calcium: 10.2 mg/dL (ref 8.7–10.3)
Chloride: 101 mmol/L (ref 96–106)
Creatinine, Ser: 1.16 mg/dL — ABNORMAL HIGH (ref 0.57–1.00)
Glucose: 107 mg/dL — ABNORMAL HIGH (ref 70–99)
Potassium: 4.2 mmol/L (ref 3.5–5.2)
Sodium: 142 mmol/L (ref 134–144)
eGFR: 51 mL/min/{1.73_m2} — ABNORMAL LOW (ref >59)

## 2022-09-13 LAB — HEMOGLOBIN A1C
Est. average glucose Bld gHb Est-mCnc: 128 mg/dL
Hgb A1c MFr Bld: 6.1 % — ABNORMAL HIGH (ref 4.8–5.6)

## 2022-09-13 LAB — VITAMIN D 25 HYDROXY (VIT D DEFICIENCY, FRACTURES): Vit D, 25-Hydroxy: 28.7 ng/mL — ABNORMAL LOW (ref 30.0–100.0)

## 2022-09-14 ENCOUNTER — Telehealth: Payer: Self-pay

## 2022-09-14 MED ORDER — HM VITAMIN D3 100 MCG (4000 UT) PO CAPS: 4000.0000 [IU] | ORAL_CAPSULE | Freq: Every day | ORAL | 3 refills | Status: AC

## 2022-09-14 NOTE — Telephone Encounter (Signed)
My Chart sent to the pt per her request.

## 2022-09-14 NOTE — Telephone Encounter (Signed)
-----   Message from Fay Records, MD sent at 09/13/2022  9:37 AM EDT ----- LIpids are still pending    Vit D is low   How much is she taking   ? 10, 000 daily?? A1C 6.1   Creeping up higher   Limit carbs, sugars Electrolytes are OK     BP a little low    Stop losartan     Pt thinks that this is what has made BP so low       Need close follow up     Will need BMET in 1 wk to see what KCL is Keep track of BP   May add back cozaar at lower dose (25)

## 2022-09-14 NOTE — Telephone Encounter (Signed)
She could take 4000 per day   Recheck Vit D in 4 months

## 2022-09-14 NOTE — Telephone Encounter (Signed)
Pt advised and she will hold her Losartan for now and have repeat labs 09/22/22 and bring in her BP readings....she has not been taking her Vit D and will find out what dose she should start back taking.

## 2022-09-22 ENCOUNTER — Telehealth: Payer: Self-pay | Admitting: Internal Medicine

## 2022-09-22 ENCOUNTER — Ambulatory Visit: Payer: Medicare Other | Attending: Internal Medicine

## 2022-09-22 DIAGNOSIS — D649 Anemia, unspecified: Secondary | ICD-10-CM

## 2022-09-22 NOTE — Telephone Encounter (Signed)
Came in on 09/22/2022 asking to speak with Dewayne Hatch. Please contact. Gave no details on what.

## 2022-09-23 LAB — CBC
Hematocrit: 38.6 % (ref 34.0–46.6)
Hemoglobin: 13.1 g/dL (ref 11.1–15.9)
MCH: 29 pg (ref 26.6–33.0)
MCHC: 33.9 g/dL (ref 31.5–35.7)
MCV: 86 fL (ref 79–97)
Platelets: 276 10*3/uL (ref 150–450)
RBC: 4.51 x10E6/uL (ref 3.77–5.28)
RDW: 14.3 % (ref 11.7–15.4)
WBC: 7.3 10*3/uL (ref 3.4–10.8)

## 2022-09-26 ENCOUNTER — Ambulatory Visit (HOSPITAL_BASED_OUTPATIENT_CLINIC_OR_DEPARTMENT_OTHER): Payer: Medicare Other | Attending: Cardiology | Admitting: Cardiology

## 2022-09-26 VITALS — Ht 67.0 in | Wt 199.0 lb

## 2022-09-26 DIAGNOSIS — G4733 Obstructive sleep apnea (adult) (pediatric): Secondary | ICD-10-CM | POA: Diagnosis not present

## 2022-09-26 DIAGNOSIS — I10 Essential (primary) hypertension: Secondary | ICD-10-CM | POA: Insufficient documentation

## 2022-10-02 NOTE — Procedures (Signed)
   Patient Name: Katelyn [name], Prout Date: 09/26/2022 Gender: Female D.O.B: March 25, 1951 Age (years): 76 Referring Provider: Armanda Magic MD, ABSM Height (inches): 67 Interpreting Physician: Armanda Magic MD, ABSM Weight (lbs): 199 RPSGT: Lowry Ram BMI: 31 MRN: [id number] Neck Size: 14.50  CLINICAL INFORMATION The patient is referred for a BiPAP titration to treat sleep apnea.  SLEEP STUDY TECHNIQUE As per the AASM Manual for the Scoring of Sleep and Associated Events v2.3 (April 2016) with a hypopnea requiring 4% desaturations.  The channels recorded and monitored were frontal, central and occipital EEG, electrooculogram (EOG), submentalis EMG (chin), nasal and oral airflow, thoracic and abdominal wall motion, anterior tibialis EMG, snore microphone, electrocardiogram, and pulse oximetry. Bilevel positive airway pressure (BPAP) was initiated at the beginning of the study and titrated to treat sleep-disordered breathing.  MEDICATIONS Medications self-administered by patient taken the night of the study : N/A  RESPIRATORY PARAMETERS Optimal IPAP Pressure (cm): N/A  AHI at Optimal Pressure (/hr) N/A Optimal EPAP Pressure (cm):N/A  Overall Minimal O2 (%):80.0  Minimal O2 at Optimal Pressure (%): 80.0  SLEEP ARCHITECTURE Start Time:9:59:37 PM  Stop Time:5:00:54 AM  Total Time (min):421.3  Total Sleep Time (min):275 Sleep Latency (min):19.8  Sleep Efficiency (%):65.3%  REM Latency (min):257.0  WASO (min):126.5 Stage N1 (%):6.7%  Stage N2 (%):80.4%  Stage N3 (%):4.2%  Stage R (%):8.7 Supine (%):45.45  Arousal Index (/hr):30.5   CARDIAC DATA The 2 lead EKG demonstrated sinus rhythm. The mean heart rate was 85.5 beats per minute. Other EKG findings include: None.  LEG MOVEMENT DATA The total Periodic Limb Movements of Sleep (PLMS) were 0. The PLMS index was 0.0. A PLMS index of <15 is considered normal in adults.  IMPRESSIONS - An optimal PAP pressure could not be  selected for this patient based on the available study data. - Central sleep apnea was not noted during this titration (CAI = 2.4/h). - Severe oxygen desaturations were observed during this titration (min O2 = 80.0%). - No snoring was audible during this study. - No cardiac abnormalities were observed during this study. - Clinically significant periodic limb movements were not noted during this study. Arousals associated with PLMs were significant.  DIAGNOSIS - Obstructive Sleep Apnea (G47.33) - Suboptimal BiPAP titration due to inability to achieve optimal pressure during REM sleep.  RECOMMENDATIONS - Recommend a trial of ResMed auto BiPAP with IPAP max 20cm H2O, EPAP min 5cm H2O and PS 4cm H2O with heated humidity and mask of choice. - Avoid alcohol, sedatives and other CNS depressants that may worsen sleep apnea and disrupt normal sleep architecture. - Sleep hygiene should be reviewed to assess factors that may improve sleep quality. - Weight management and regular exercise should be initiated or continued. - Return to Sleep Center for re-evaluation after 4 weeks of therapy  [Electronically signed] 10/02/2022 10:27 AM  Armanda Magic MD, ABSM Diplomate, American Board of Sleep Medicine

## 2022-10-09 ENCOUNTER — Telehealth: Payer: Self-pay | Admitting: *Deleted

## 2022-10-09 NOTE — Telephone Encounter (Signed)
The patient has been notified of the result and verbalized understanding.  All questions (if any) were answered. Latrelle Dodrill, CMA 10/09/2022 4:45 PM    Upon patient request DME selection is [facility]. Patient understands he will be contacted by [facility] to set up his cpap. Patient understands to call if [facility] does not contact him with new setup in a timely manner. Patient understands they will be called once confirmation has been received from [facility]/ that they have received their new machine to schedule 10 week follow up appointment.   [facility] notified of new cpap order  Please add to airview Patient was grateful for the call and thanked me.

## 2022-10-09 NOTE — Telephone Encounter (Signed)
-----   Message from Gaynelle Cage, [place] sent at 10/02/2022  1:39 PM EDT -----  ----- Message ----- From: Quintella Reichert, MD Sent: 10/02/2022  10:29 AM EDT To: Cv Div Sleep Studies  Please let patient know that they had a successful PAP titration and let DME know that orders are in EPIC.  Please set up 6 week OV with me. ONO on BIPAP

## 2022-11-30 ENCOUNTER — Telehealth: Payer: Self-pay | Admitting: Cardiology

## 2022-11-30 NOTE — Telephone Encounter (Signed)
Pt called in stating she is not acclimating well to the CPAP machine and would to speak to nurse about it.

## 2022-12-18 ENCOUNTER — Ambulatory Visit: Payer: Medicare Other | Attending: Cardiology | Admitting: Cardiology

## 2022-12-18 ENCOUNTER — Encounter: Payer: Self-pay | Admitting: Cardiology

## 2022-12-18 VITALS — BP 118/74 | HR 57 | Ht 67.0 in | Wt 192.4 lb

## 2022-12-18 DIAGNOSIS — G4733 Obstructive sleep apnea (adult) (pediatric): Secondary | ICD-10-CM | POA: Insufficient documentation

## 2022-12-18 DIAGNOSIS — I10 Essential (primary) hypertension: Secondary | ICD-10-CM | POA: Diagnosis not present

## 2022-12-18 NOTE — Addendum Note (Signed)
Addended by: Frutoso Schatz on: 12/18/2022 02:26 PM   Modules accepted: Orders

## 2022-12-18 NOTE — Progress Notes (Signed)
Sleep Medicine CONSULT Note    Date:  12/18/2022   ID:  Katelyn [name], DOB 1951/04/19, MRN [id number]  PCP:  Deno Lunger, MD  Cardiologist: Dietrich Pates, MD   Chief Complaint  Patient presents with   New Patient (Initial Visit)    Obstructive sleep apnea    History of Present Illness:  Katelyn [name] is a 72 y.o. female who is being seen today for the evaluation of OSA at the request of Dietrich Pates, MD.  This is a 72yo female with a hx of depression, anxiety, HTN, HLD, PAF.  The patient was seen by Dr. Tenny Craw and complained of excessive daytime sleepiness and due to hx of PAF and HTN as well as migraine HAs, she was referred for home sleep study.  Stop Bang Score of 4.  She underwent home sleep study which showed severe OSA with an AHI of 28.6/hr and nocturnal hypoxemia.  She was referred for CPAP titration but could not be titrated adequately due to ongoing respiratory events.  She ultimately underwent BiPAP titration was started on auto BiPAP with IPAP max 20 cm H2O, EPAP min 5 cm H2O and pressure support of 4 cm H2O.  She is now referred for sleep medicine consultation to establish sleep care  She tells me that she is waking up at night and taking the device off.  She has had some problems with getting a mask she could tolerate but the last mask , which is an under the nose FFM,  she got is working the best so far.  She has lost 20lbs recently and is trying to get her weight down more.  She does not really feel more rested in the am after starting the BiPAP and she is actually feeling more tired during the day after starting the PAP therapy due to poor sleep.  She does have a hx of septorhinoplasty in the 1980's and then a revision.    Past Medical History:  Diagnosis Date   Anemia    ANXIETY DEPRESSION 04/14/2009   GERD 09/07/2009   HYPERLIPIDEMIA 09/07/2009   HYPERTENSION 07/02/2009   KNEE PAIN, RIGHT 12/07/2009   MIGRAINE UNSP W/O INTRACT W/O STATUS MIGRAINOSUS  07/02/2009   Papilloma of right breast    Paroxysmal atrial fibrillation (HCC)    PONV (postoperative nausea and vomiting)    Recurrent UTI     Past Surgical History:  Procedure Laterality Date   ABDOMINAL HYSTERECTOMY  1997   APPENDECTOMY  1964   ATRIAL FIBRILLATION ABLATION N/A 04/21/2021   Procedure: ATRIAL FIBRILLATION ABLATION;  Surgeon: Hillis Range, MD;  Location: [facility];  Service: Cardiovascular;  Laterality: N/A;   BREAST LUMPECTOMY WITH RADIOACTIVE SEED LOCALIZATION Right 04/09/2015   Procedure: RIGHT BREAST LUMPECTOMY WITH RADIOACTIVE SEED LOCALIZATION;  Surgeon: Ovidio Kin, MD;  Location: [facility];  Service: General;  Laterality: Right;   COLONOSCOPY N/A 03/26/2014   Procedure: COLONOSCOPY;  Surgeon: Rachael Fee, MD;  Location: [facility];  Service: Endoscopy;  Laterality: N/A;   ESOPHAGOGASTRODUODENOSCOPY N/A 03/26/2014   Procedure: ESOPHAGOGASTRODUODENOSCOPY (EGD);  Surgeon: Rachael Fee, MD;  Location: [facility];  Service: Endoscopy;  Laterality: N/A;   Septorhinoplasty  1979    Current Medications: Current Meds  Medication Sig   apixaban (ELIQUIS) 5 MG TABS tablet Take 1 tablet (5 mg total) by mouth 2 (two) times daily.   busPIRone (BUSPAR) 15 MG tablet Take 15 mg by mouth 2 (two) times daily.  chlorthalidone (HYGROTON) 25 MG tablet Take 1 tablet (25 mg total) by mouth daily.   Cholecalciferol (HM VITAMIN D3) 100 MCG (4000 UT) CAPS Take 1 capsule (4,000 Units total) by mouth daily in the afternoon.   Cod Liver Oil 1000 MG CAPS Take 1,000 mg by mouth at bedtime.   Coenzyme Q10 300 MG CAPS Take 300 mg by mouth at bedtime.   diltiazem (CARDIZEM CD) 120 MG 24 hr capsule Take 1 capsule (120 mg total) by mouth daily. Need annual visit for further refills   diphenhydrAMINE (BENADRYL) 50 MG capsule Take 50 mg by mouth as needed for itching, allergies or sleep.   DULoxetine (CYMBALTA) 60 MG capsule Take 60 mg by mouth every evening.    ferrous sulfate 325 (65 FE) MG tablet Take 325 mg by mouth every other day. In the evening   flecainide (TAMBOCOR) 50 MG tablet Take 1 tablet (50 mg total) by mouth 2 (two) times daily.   fluticasone (FLONASE) 50 MCG/ACT nasal spray Place 2 sprays into the nose at bedtime as needed for allergies or rhinitis.   Magnesium Oxide 500 MG CAPS Take 500 mg by mouth at bedtime.   potassium chloride SA (KLOR-CON M) 20 MEQ tablet Take 1 tablet (20 mEq total) by mouth daily.   propranolol (INDERAL) 80 MG tablet Take 40 mg by mouth 3 (three) times daily.   SUMAtriptan (IMITREX) 100 MG tablet TAKE 1 TABLET BY MOUTH TWICE A DAY AS NEEDED FOR MIGRAINE   Thiamine HCl (VITAMIN B-1) 250 MG tablet Take 250 mg by mouth at bedtime.    Allergies:   Codeine, Lisinopril, and Morphine   Social History   Socioeconomic History   Marital status: Divorced    Spouse name: Not on file   Number of children: 1   Years of education: Not on file   Highest education level: Not on file  Occupational History   Occupation: Charity fundraiser    Employer: [employer]  Tobacco Use   Smoking status: Never   Smokeless tobacco: Never  Substance and Sexual Activity   Alcohol use: Not Currently    Alcohol/week: 0.0 standard drinks of alcohol    Comment: social occasional   Drug use: No   Sexual activity: Never    Birth control/protection: None  Other Topics Concern   Not on file  Social History Narrative   Previously worked at [employer], has been stressed by 12 years of separation with husband who recently stopped paying alimony.  Going back to work as Charity fundraiser after not working for 12 years.     Social Determinants of Health   Financial Resource Strain: Not on file  Food Insecurity: Not on file  Transportation Needs: Not on file  Physical Activity: Not on file  Stress: Not on file  Social Connections: Not on file     Family History:  The patient's family history includes Breast cancer in her mother; Hypertension in her  father, mother, paternal grandfather, and paternal grandmother; Lung cancer in her father.   ROS:   Please see the history of present illness.    ROS All other systems reviewed and are negative.      No data to display             PHYSICAL EXAM:   VS:  BP 118/74   Pulse (!) 57   Ht 5\' 7"  (1.702 m)   Wt 192 lb 6.4 oz (87.3 kg)   SpO2 93%   BMI 30.13 kg/m  GEN: Well nourished, well developed, in no acute distress  HEENT: normal  Neck: no JVD, carotid bruits, or masses Cardiac: RRR; no murmurs, rubs, or gallops,no edema.  Intact distal pulses bilaterally.  Respiratory:  clear to auscultation bilaterally, normal work of breathing GI: soft, nontender, nondistended, + BS MS: no deformity or atrophy  Skin: warm and dry, no rash Neuro:  Alert and Oriented x 3, Strength and sensation are intact Psych: euthymic mood, full affect  Wt Readings from Last 3 Encounters:  12/18/22 192 lb 6.4 oz (87.3 kg)  09/26/22 199 lb (90.3 kg)  09/12/22 212 lb 9.6 oz (96.4 kg)      Studies/Labs Reviewed:   Home sleep study, CPAP titration, BiPAP titration, PAP compliance download  Recent Labs: 09/12/2022: BUN 22; Creatinine, Ser 1.16; Potassium 4.2; Sodium 142 09/22/2022: Hemoglobin 13.1; Platelets 276    CHA2DS2-VASc Score =     This indicates a  % annual risk of stroke. The patient's score is based upon:            Additional studies/ records that were reviewed today include:  none    ASSESSMENT:    1. OSA (obstructive sleep apnea)   2. Essential hypertension, benign      PLAN:  In order of problems listed above:  OSA - The patient is tolerating PAP therapy well without any problems. The PAP download performed by his DME was personally reviewed and interpreted by me today and showed an AHI of 4.8 /hr on auto BiPAP cm H2O with 37% compliance in using more than 4 hours nightly.  The patient has been using and benefiting from PAP use and will continue to benefit from  therapy.  -her compliance is down because she had a panic attack with the initial mask she was using and then stopped using it for 8 days and now after getting her new mask she has used every night.  -I encouraged her to be more compliant with her device -she has a permanent plate in her mouth so oral device would not be recommended -We discussed how the Hypoglossal Nerve stimulator works and she would like to consider this -I am going to refer her to Dr. Jenne Pane with [facility] for sleep induced endocsccopy to see if she would qualify for the [facility] device.   2.  Hypertension -BP is controlled on exam today -Continue prescription drug management with chlorthalidone 25 mg daily, Cardizem CD 120 mg daily, propranolol 40 mg 3 times daily and losartan 50 mg daily with as needed refills  Followup with me in 3 months   Time Spent: 20 minutes total time of encounter, including 15 minutes spent in face-to-face patient care on the date of this encounter. This time includes coordination of care and counseling regarding above mentioned problem list. Remainder of non-face-to-face time involved reviewing chart documents/testing relevant to the patient encounter and documentation in the medical record. I have independently reviewed documentation from referring provider  Medication Adjustments/Labs and Tests Ordered: Current medicines are reviewed at length with the patient today.  Concerns regarding medicines are outlined above.  Medication changes, Labs and Tests ordered today are listed in the Patient Instructions below.  There are no Patient Instructions on file for this visit.   Signed, Armanda Magic, MD  12/18/2022 2:11 PM    [facility] [address] [place], [place], [place]  [address] Phone: [phone]; Fax: [phone]

## 2022-12-18 NOTE — Patient Instructions (Signed)
Medication Instructions:  Your physician recommends that you continue on your current medications as directed. Please refer to the Current Medication list given to you today.  *If you need a refill on your cardiac medications before your next appointment, please call your pharmacy*   Follow-Up: At [facility], you and your health needs are our priority.  As part of our continuing mission to provide you with exceptional heart care, we have created designated Provider Care Teams.  These Care Teams include your primary Cardiologist (physician) and Advanced Practice Providers (APPs -  Physician Assistants and Nurse Practitioners) who all work together to provide you with the care you need, when you need it.  Your next appointment:   You have been referred to see Dr. Jenne Pane with ENT to discuss [facility] device

## 2022-12-20 NOTE — Telephone Encounter (Signed)
Patient spoke with Dr. Mayford Knife on 12/18/22. CPAP issue resolved.

## 2023-03-05 ENCOUNTER — Other Ambulatory Visit: Payer: Self-pay | Admitting: Internal Medicine

## 2023-03-05 DIAGNOSIS — I480 Paroxysmal atrial fibrillation: Secondary | ICD-10-CM

## 2023-03-05 MED ORDER — FLECAINIDE ACETATE 50 MG PO TABS: 50.0000 mg | ORAL_TABLET | Freq: Two times a day (BID) | ORAL | 1 refills | Status: DC

## 2023-03-05 NOTE — Telephone Encounter (Signed)
Prescription refill request for Eliquis received. Indication:afib Last office visit:7/24 Scr:1.16  4/24 Age: 72 Weight:87.3  kg  Prescription refilled

## 2023-09-19 ENCOUNTER — Other Ambulatory Visit: Payer: Self-pay | Admitting: Internal Medicine

## 2023-10-06 ENCOUNTER — Other Ambulatory Visit: Payer: Self-pay | Admitting: Internal Medicine

## 2023-10-06 DIAGNOSIS — I480 Paroxysmal atrial fibrillation: Secondary | ICD-10-CM

## 2023-10-08 NOTE — Telephone Encounter (Signed)
 Eliquis  5mg  refill request received. Patient is 73 years old, weight-87.3kg, Crea-1.16 on 09/12/22-needs labs, Diagnosis-Afib, and last seen by Dr. Micael Adas on 12/18/22. Dose is appropriate based on dosing criteria.    Left message for pt to call back regarding a refill request.

## 2023-10-09 NOTE — Telephone Encounter (Signed)
 Pt returned call and states she will be going in the next month to have her physical.  She states she would like to come to [facility] at the new location [address] and go to the lab. Advised orders will be placed and she was thankful and asked to wait to send the refill since she has a lot of eliquis  on hand.

## 2023-10-10 LAB — CBC

## 2023-10-10 NOTE — Telephone Encounter (Signed)
 Pt called and states she has labs drawn this morning at [facility] office.

## 2023-10-11 ENCOUNTER — Encounter: Payer: Self-pay | Admitting: Internal Medicine

## 2023-10-11 LAB — CBC
Hematocrit: 38.8 % (ref 34.0–46.6)
Hemoglobin: 13.3 g/dL (ref 11.1–15.9)
MCH: 30.7 pg (ref 26.6–33.0)
MCHC: 34.3 g/dL (ref 31.5–35.7)
MCV: 90 fL (ref 79–97)
Platelets: 268 10*3/uL (ref 150–450)
RBC: 4.33 x10E6/uL (ref 3.77–5.28)
RDW: 12.5 % (ref 11.7–15.4)
WBC: 6.3 10*3/uL (ref 3.4–10.8)

## 2023-10-11 LAB — BASIC METABOLIC PANEL WITH GFR
BUN/Creatinine Ratio: 15 (ref 12–28)
BUN: 18 mg/dL (ref 8–27)
CO2: 27 mmol/L (ref 20–29)
Calcium: 9.7 mg/dL (ref 8.7–10.3)
Chloride: 100 mmol/L (ref 96–106)
Creatinine, Ser: 1.24 mg/dL — ABNORMAL HIGH (ref 0.57–1.00)
Glucose: 96 mg/dL (ref 70–99)
Potassium: 3.6 mmol/L (ref 3.5–5.2)
Sodium: 143 mmol/L (ref 134–144)
eGFR: 47 mL/min/{1.73_m2} — ABNORMAL LOW (ref >59)

## 2023-10-11 NOTE — Telephone Encounter (Signed)
 Prescription refill request for Eliquis  received. Indication: Afib  Last office visit: 12/18/22 Micael Adas)  Scr: 1.24 (10/10/23)  Age: 73 Weight: 87.3kg  Appropriate dose. Refill sent.

## 2023-11-19 ENCOUNTER — Other Ambulatory Visit: Payer: Self-pay | Admitting: Internal Medicine

## 2023-11-23 ENCOUNTER — Telehealth: Payer: Self-pay

## 2023-11-23 NOTE — Telephone Encounter (Signed)
   Pre-operative Risk Assessment    Patient Name: Katelyn [name]  DOB: [birth year] MRN: [id number]   Date of last office visit: 09/12/22 Ola Berger, MD Date of next office visit: NONE   Request for Surgical Clearance    Procedure:  COLONOSCOPY  Date of Surgery:  Clearance TBD                                Surgeon:  DR Merlene Stark Surgeon's Group or Practice Name:  [facility] Phone number:  [phone] Fax number:  [phone]   Type of Clearance Requested:   - Medical  - Pharmacy:  Hold Apixaban  (Eliquis ) 2 DAYS PRIOR   Type of Anesthesia:  Not Indicated   Additional requests/questions:    Signed, Collin Deal   11/23/2023, 4:54 PM

## 2023-11-26 NOTE — Telephone Encounter (Signed)
 Dr. Avanell Bob,   You saw this patient on 09/12/2022. Per office protocol, will you please comment on medical clearance for colonoscopy?  Please route your response to P [facility] Preop. I will communicate with requesting office once you have given recommendations.   Thank you!  Morey Ar, NP

## 2023-11-26 NOTE — Telephone Encounter (Signed)
Please advise holding Eliquis prior to colonoscopy.  Thank you!  DW  

## 2023-11-26 NOTE — Telephone Encounter (Signed)
 Patient with diagnosis of afib on Eliquis  for anticoagulation.    Procedure: COLONOSCOPY  Date of procedure: TBD   CHA2DS2-VASc Score = 3   This indicates a 3.2% annual risk of stroke. The patient's score is based upon: CHF History: 0 HTN History: 1 Diabetes History: 0 Stroke History: 0 Vascular Disease History: 0 Age Score: 1 Gender Score: 1   CrCl 48 mL/min  Platelet count 268  Patient has not had an Afib/aflutter ablation within the last 3 months or DCCV within the last 30 days  Per office protocol, patient can hold Eliquis  for 2 days prior to procedure.   Patient will not need bridging with Lovenox (enoxaparin) around procedure.  **This guidance is not considered finalized until pre-operative APP has relayed final recommendations.**

## 2023-11-27 NOTE — Telephone Encounter (Signed)
   Name: Katelyn [name]  DOB: 1951/04/19  MRN: [id number]   Primary Cardiologist: Ola Berger, MD  Chart reviewed as part of pre-operative protocol coverage. Katelyn [name] was last seen on 09/12/2023 by Dr. Avanell Bob.  Her BP was on the low side and losartan  was decreased.  She was otherwise stable from a cardiac standpoint.  Therefore, based on ACC/AHA guidelines, the patient would be an acceptable risk for the planned procedure without further cardiovascular testing.   Per Pharm D, patient has not had an Afib/aflutter ablation within the last 3 months or DCCV within the last 30 days. Patient may hold Eliquis  for 2 days prior to procedure.  Patient will not need bridging with Lovenox around procedure.    I will route this recommendation to the requesting party via Epic fax function and remove from pre-op pool. Please call with questions.  Morey Ar, NP 11/27/2023, 3:51 PM

## 2023-12-11 ENCOUNTER — Telehealth: Payer: Self-pay | Admitting: Internal Medicine

## 2023-12-11 MED ORDER — FLECAINIDE ACETATE 50 MG PO TABS: 50.0000 mg | ORAL_TABLET | Freq: Two times a day (BID) | ORAL | 0 refills | Status: DC

## 2023-12-11 MED ORDER — CHLORTHALIDONE 25 MG PO TABS: 25.0000 mg | ORAL_TABLET | Freq: Every day | ORAL | 0 refills | Status: DC

## 2023-12-11 NOTE — Telephone Encounter (Signed)
 Pt's medications were sent to pt's pharmacy as requested. Confirmation received.

## 2023-12-11 NOTE — Telephone Encounter (Signed)
*  STAT* If patient is at the pharmacy, call can be transferred to refill team.   1. Which medications need to be refilled? (please list name of each medication and dose if known)   flecainide  (TAMBOCOR ) 50 MG tablet  chlorthalidone  (HYGROTON ) 25 MG tablet   2. Would you like to learn more about the convenience, safety, & potential cost savings by using the [facility]?   3. Are you open to using the [facility] (Type [facility]. ).  4. Which pharmacy/location (including street and city if local pharmacy) is medication to be sent to?  [facility] [address] - [place], [place] - [address]   5. Do they need a 30 day or 90 day supply?  90 day  Patient stated she is completely out of this medication. Patient has appointment scheduled with E. Monge on 7/21.

## 2023-12-31 ENCOUNTER — Encounter: Payer: Self-pay | Admitting: Nurse Practitioner

## 2023-12-31 ENCOUNTER — Ambulatory Visit: Attending: Nurse Practitioner | Admitting: Nurse Practitioner

## 2023-12-31 VITALS — BP 108/72 | HR 54 | Ht 67.0 in | Wt 195.4 lb

## 2023-12-31 DIAGNOSIS — I480 Paroxysmal atrial fibrillation: Secondary | ICD-10-CM | POA: Diagnosis not present

## 2023-12-31 DIAGNOSIS — D509 Iron deficiency anemia, unspecified: Secondary | ICD-10-CM | POA: Insufficient documentation

## 2023-12-31 DIAGNOSIS — E785 Hyperlipidemia, unspecified: Secondary | ICD-10-CM | POA: Diagnosis present

## 2023-12-31 DIAGNOSIS — I700 Atherosclerosis of aorta: Secondary | ICD-10-CM | POA: Insufficient documentation

## 2023-12-31 DIAGNOSIS — G4733 Obstructive sleep apnea (adult) (pediatric): Secondary | ICD-10-CM | POA: Insufficient documentation

## 2023-12-31 DIAGNOSIS — I10 Essential (primary) hypertension: Secondary | ICD-10-CM | POA: Diagnosis present

## 2023-12-31 DIAGNOSIS — I340 Nonrheumatic mitral (valve) insufficiency: Secondary | ICD-10-CM | POA: Diagnosis not present

## 2023-12-31 NOTE — Progress Notes (Unsigned)
 Office Visit    Patient Name: Katelyn [name] Date of Encounter: 01/01/2024  Primary Care Provider:  Fairy Lyndee Jackson, FNP Primary Cardiologist:  Vina Gull, MD  Chief Complaint    73 year old female with a history of paroxysmal atrial fibrillation s/p ablation, aortic atherosclerosis, mild mitral valve regurgitation, hypertension, hyperlipidemia, anemia, OSA, and GERD who presents for follow-up related to atrial fibrillation.  Past Medical History    Past Medical History:  Diagnosis Date   Anemia    ANXIETY DEPRESSION 04/14/2009   GERD 09/07/2009   HYPERLIPIDEMIA 09/07/2009   HYPERTENSION 07/02/2009   KNEE PAIN, RIGHT 12/07/2009   MIGRAINE UNSP W/O INTRACT W/O STATUS MIGRAINOSUS 07/02/2009   Papilloma of right breast    Paroxysmal atrial fibrillation (HCC)    PONV (postoperative nausea and vomiting)    Recurrent UTI    Past Surgical History:  Procedure Laterality Date   ABDOMINAL HYSTERECTOMY  1997   APPENDECTOMY  1964   ATRIAL FIBRILLATION ABLATION N/A 04/21/2021   Procedure: ATRIAL FIBRILLATION ABLATION;  Surgeon: Kelsie Agent, MD;  Location: [facility];  Service: Cardiovascular;  Laterality: N/A;   BREAST LUMPECTOMY WITH RADIOACTIVE SEED LOCALIZATION Right 04/09/2015   Procedure: RIGHT BREAST LUMPECTOMY WITH RADIOACTIVE SEED LOCALIZATION;  Surgeon: Alm Angle, MD;  Location: [facility];  Service: General;  Laterality: Right;   COLONOSCOPY N/A 03/26/2014   Procedure: COLONOSCOPY;  Surgeon: Toribio SHAUNNA Cedar, MD;  Location: [facility];  Service: Endoscopy;  Laterality: N/A;   ESOPHAGOGASTRODUODENOSCOPY N/A 03/26/2014   Procedure: ESOPHAGOGASTRODUODENOSCOPY (EGD);  Surgeon: Toribio SHAUNNA Cedar, MD;  Location: [facility];  Service: Endoscopy;  Laterality: N/A;   Septorhinoplasty  1979    Allergies  Allergies  Allergen Reactions   Codeine Nausea And Vomiting   Lisinopril  Cough and Other (See Comments)    Other reaction(s): Cough  (ALLERGY/intolerance) cough    Morphine Hives, Itching and Nausea And Vomiting     Labs/Other Studies Reviewed    The following studies were reviewed today:  Cardiac Studies & Procedures   ______________________________________________________________________________________________     ECHOCARDIOGRAM  ECHOCARDIOGRAM COMPLETE 12/18/2014  Narrative *[facility]* [address] [place], [place] [address] [phone]  ------------------------------------------------------------------- Transthoracic Echocardiography  Patient:    Meghan, Warshawsky MR #:       [id number] Study Date: 12/18/2014 Gender:     F Age:        27 Height:     172.7 cm Weight:     93.9 kg BSA:        2.15 m^2 Pt. Status: Room:  ATTENDING    Aleene Passe, M.D. ORDERING     Vina Gull, M.D. REFERRING    Vina Gull, M.D. SONOGRAPHER  Celestia, Will PERFORMING   [facility]  cc:  ------------------------------------------------------------------- LV EF: 60% -   65%  ------------------------------------------------------------------- Indications:      (R06.02).  ------------------------------------------------------------------- History:   PMH:  Fatigue. Tachycardia. Acquired from the patient and from the patient&'s chart.  Dyspnea.  Atrial fibrillation.  Risk factors:  Hypertension. Dyslipidemia.  ------------------------------------------------------------------- Study Conclusions  - Left ventricle: The cavity size was normal. Wall thickness was increased in a pattern of mild LVH. Systolic function was normal. The estimated ejection fraction was in the range of 60% to 65%. Wall motion was normal; there were no regional wall motion abnormalities. - Mitral valve: There was mild regurgitation. - Pulmonary arteries: PA peak pressure: 31 mm Hg (S).  Transthoracic echocardiography.  M-mode, complete 2D, spectral Doppler, and  color Doppler.  Birthdate:  Patient  birthdate: 04/19/1951.  Age:  Patient is 73 yr old.  Sex:  Gender: female. BMI: 31.5 kg/m^2.  Blood pressure:     128/70  Patient status: Outpatient.  Study date:  Study date: 12/18/2014. Study time: 07:32 AM.  Location:  [facility]  -------------------------------------------------------------------  ------------------------------------------------------------------- Left ventricle:  The cavity size was normal. Wall thickness was increased in a pattern of mild LVH. Systolic function was normal. The estimated ejection fraction was in the range of 60% to 65%. Wall motion was normal; there were no regional wall motion abnormalities.  ------------------------------------------------------------------- Aortic valve:   Structurally normal valve.   Cusp separation was normal.  Doppler:  Transvalvular velocity was within the normal range. There was no stenosis. There was no regurgitation.  ------------------------------------------------------------------- Aorta:  Aortic root: The aortic root was normal in size. Ascending aorta: The ascending aorta was normal in size.  ------------------------------------------------------------------- Mitral valve:   Structurally normal valve.   Leaflet separation was normal.  Doppler:  Transvalvular velocity was within the normal range. There was no evidence for stenosis. There was mild regurgitation.    Peak gradient (D): 3 mm Hg.  ------------------------------------------------------------------- Left atrium:  The atrium was normal in size.  ------------------------------------------------------------------- Right ventricle:  The cavity size was normal. Systolic function was normal.  ------------------------------------------------------------------- Pulmonic valve:    The valve appears to be grossly normal. Doppler:  There was trivial regurgitation.  ------------------------------------------------------------------- Tricuspid valve:    Structurally normal valve.   Leaflet separation was normal.  Doppler:  Transvalvular velocity was within the normal range. There was mild regurgitation.  ------------------------------------------------------------------- Pulmonary artery:   Systolic pressure was within the normal range.  ------------------------------------------------------------------- Right atrium:  The atrium was normal in size.  ------------------------------------------------------------------- Pericardium:  There was no pericardial effusion.  ------------------------------------------------------------------- Measurements  Left ventricle                         Value        Reference LV ID, ED, PLAX chordal                47.5  mm     43 - 52 LV ID, ES, PLAX chordal                30.7  mm     23 - 38 LV fx shortening, PLAX chordal         35    %      >=29 LV PW thickness, ED                    11.4  mm     --------- IVS/LV PW ratio, ED                    1.14         <=1.3 Stroke volume, 2D                      95    ml     --------- Stroke volume/bsa, 2D                  44    ml/m^2 --------- LV e&', lateral                         7.9   cm/s   --------- LV E/e&', lateral  11.3         --------- LV e&', medial                          8.77  cm/s   --------- LV E/e&', medial                        10.18        --------- LV e&', average                         8.34  cm/s   --------- LV E/e&', average                       10.71        ---------  Ventricular septum                     Value        Reference IVS thickness, ED                      13    mm     ---------  LVOT                                   Value        Reference LVOT ID, S                             21    mm     --------- LVOT area                              3.46  cm^2   --------- LVOT ID                                21    mm     --------- LVOT peak velocity, S                  126   cm/s   --------- LVOT  mean velocity, S                  72.6  cm/s   --------- LVOT VTI, S                            27.4  cm     --------- LVOT peak gradient, S                  6     mm Hg  --------- Stroke volume (SV), LVOT DP            94.9  ml     --------- Stroke index (SV/bsa), LVOT DP         44.1  ml/m^2 ---------  Aorta                                  Value        Reference Aortic root ID, ED  31    mm     --------- Ascending aorta ID, A-P, S             32    mm     ---------  Left atrium                            Value        Reference LA ID, A-P, ES                         42    mm     --------- LA ID/bsa, A-P                         1.95  cm/m^2 <=2.2 LA volume, S                           102   ml     --------- LA volume/bsa, S                       47.4  ml/m^2 --------- LA volume, ES, 1-p A4C                 109   ml     --------- LA volume/bsa, ES, 1-p A4C             50.7  ml/m^2 --------- LA volume, ES, 1-p A2C                 85    ml     --------- LA volume/bsa, ES, 1-p A2C             39.5  ml/m^2 ---------  Mitral valve                           Value        Reference Mitral E-wave peak velocity            89.3  cm/s   --------- Mitral A-wave peak velocity            58.7  cm/s   --------- Mitral deceleration time       (H)     261   ms     150 - 230 Mitral peak gradient, D                3     mm Hg  --------- Mitral E/A ratio, peak                 1.5          ---------  Pulmonary arteries                     Value        Reference PA pressure, S, DP             (H)     31    mm Hg  <=30  Tricuspid valve                        Value        Reference Tricuspid regurg peak velocity         264   cm/s   --------- Tricuspid peak RV-RA gradient  28    mm Hg  ---------  Systemic veins                         Value        Reference Estimated CVP                          3     mm Hg  ---------  Right ventricle                        Value         Reference RV pressure, S, DP             (H)     31    mm Hg  <=30 RV s&', lateral, S                      13.4  cm/s   ---------  Legend: (L)  and  (H)  mark values outside specified reference range.  ------------------------------------------------------------------- Prepared and Electronically Authenticated by  Aleene Passe, M.D. 2016-07-08T08:48:03    MONITORS  LONG TERM MONITOR (3-14 DAYS) 11/27/2019  Narrative Sinus rhythm Occasional PVCs Afib with some Afib with RVR   CT SCANS  CT CARDIAC SCORING (SELF PAY ONLY) 12/11/2014  Addendum 12/16/2014  8:19 AM ADDENDUM REPORT: 12/16/2014 08:17  CLINICAL DATA:  Risk stratification  EXAM: Coronary Calcium  Score  TECHNIQUE: The patient was scanned on a Siemens Sensation 16 slice scanner. Axial non-contrast 3mm slices were carried out through the heart. The data set was analyzed on a dedicated work station and scored using the Agatson method.  FINDINGS: Non-cardiac: RML lung nodule and large hiatal hernia on limited lung and soft tissue windows. See separate report from [facility].  Ascending Aorta:  3.5 cm  Pericardium: Normal  Coronary arteries:  No coronary calcium  seen  IMPRESSION: Coronary calcium  score of 0.  Maude Emmer   Electronically Signed By: Maude Emmer M.D. On: 12/16/2014 08:17  Narrative EXAM: OVER-READ INTERPRETATION  CT CHEST  The following report is an over-read performed by radiologist Dr. Camellia Lang [facility] on 12/11/2014. This over-read does not include interpretation of cardiac or coronary anatomy or pathology. The coronary calcium  score/coronary CTA interpretation by the cardiologist is attached.  COMPARISON:  None.  FINDINGS: 3 mm right middle lobe pulmonary nodule is seen on image 17 series for. No evidence for lymphadenopathy within the visualized mediastinum or either hilum. No pericardial effusion. 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 No gastric distention to suggest obstruction at this time.  IMPRESSION: 3 mm right middle lobe pulmonary nodule. If the patient is at high risk for bronchogenic carcinoma, follow-up chest CT at 1 year is recommended. If the patient is at low risk, no follow-up is needed. This recommendation follows the consensus statement: Guidelines for Management of Small Pulmonary Nodules Detected on CT Scans: A Statement from the Fleischner Society as published in Radiology 2005; 237:395-400.  Large hiatal hernia.  Electronically Signed: By: Camellia Candle M.D. On: 12/11/2014 16:14     ______________________________________________________________________________________________     Recent Labs: 10/10/2023: BUN 18; Creatinine, Ser 1.24; Hemoglobin 13.3; Platelets 268; Potassium 3.6; Sodium 143  Recent Lipid Panel    Component Value Date/Time   CHOL 154 01/30/2017 0831   TRIG 107 01/30/2017 0831   HDL 60 01/30/2017 0831  CHOLHDL 2.6 01/30/2017 0831   CHOLHDL 4 11/23/2015 1351   VLDL 26.4 11/23/2015 1351   LDLCALC 73 01/30/2017 0831   LDLDIRECT 118.9 12/07/2009 0953    History of Present Illness    73 year old female with the above past medical history including paroxysmal atrial fibrillation s/p ablation, aortic atherosclerosis, mild mitral valve regurgitation, hypertension, hyperlipidemia, anemia, OSA, and GERD.  Coronary calcium  score in 2016 was 0.  Echocardiogram in 12/2014 showed EF 60 to 65%, normal LV function, no RWMA, normal RV systolic function, mild mitral valve regurgitation.  She has a history of paroxysmal atrial fibrillation s/p atrial fibrillation ablation in 2022.  Cardiac CT prior to ablation showed aortic atherosclerosis, evidence of pulmonary artery enlargement suggestive of possible pulmonary arterial hypertension, hiatal hernia.  She was last seen in the office on 09/12/2022 and was stable from a cardiac standpoint.  She  reported rare episodes of breakthrough atrial fibrillation, mild orthostatic dizziness. Losartan  was decreased. She established with Dr. Shlomo in 12/2022 for management of sleep apnea.  She presents today for follow-up.  Since her last visit she has been stable overall from a cardiac standpoint.  She reports a few episodes of breakthrough atrial fibrillation, each lasting less than 24 hours, most recently was during her colonoscopy prep. She denies any chest pain, dizziness, dyspnea, edema, PND, orthopnea, weight gain.  Overall, she reports feeling well.  Home Medications    Current Outpatient Medications  Medication Sig Dispense Refill   apixaban  (ELIQUIS ) 5 MG TABS tablet TAKE ONE TABLET BY MOUTH TWICE A DAY 180 tablet 1   busPIRone (BUSPAR) 15 MG tablet Take 15 mg by mouth 2 (two) times daily.     chlorthalidone  (HYGROTON ) 25 MG tablet Take 1 tablet (25 mg total) by mouth daily. 30 tablet 0   Cholecalciferol (HM VITAMIN D3) 100 MCG (4000 UT) CAPS Take 1 capsule (4,000 Units total) by mouth daily in the afternoon. 100 capsule 3   Cod Liver Oil 1000 MG CAPS Take 1,000 mg by mouth at bedtime.     Coenzyme Q10 300 MG CAPS Take 300 mg by mouth at bedtime.     diltiazem  (CARDIZEM  CD) 120 MG 24 hr capsule Take 1 capsule (120 mg total) by mouth daily. Need annual visit for further refills 90 capsule 0   diphenhydrAMINE  (BENADRYL ) 50 MG capsule Take 50 mg by mouth as needed for itching, allergies or sleep.     DULoxetine (CYMBALTA) 60 MG capsule Take 60 mg by mouth every evening.     ferrous sulfate 325 (65 FE) MG tablet Take 325 mg by mouth every other day. In the evening     flecainide  (TAMBOCOR ) 50 MG tablet Take 1 tablet (50 mg total) by mouth 2 (two) times daily. 60 tablet 0   fluticasone  (FLONASE ) 50 MCG/ACT nasal spray Place 2 sprays into the nose at bedtime as needed for allergies or rhinitis.     Magnesium Oxide 500 MG CAPS Take 500 mg by mouth at bedtime.     potassium chloride  SA (KLOR-CON   M) 20 MEQ tablet Take 1 tablet (20 mEq total) by mouth daily. 30 tablet 11   propranolol  (INDERAL ) 80 MG tablet Take 40 mg by mouth 3 (three) times daily.     SUMAtriptan  (IMITREX ) 100 MG tablet TAKE 1 TABLET BY MOUTH TWICE A DAY AS NEEDED FOR MIGRAINE 10 tablet 0   Thiamine HCl (VITAMIN B-1) 250 MG tablet Take 250 mg by mouth at bedtime.     losartan  (COZAAR )  50 MG tablet Take 1 tablet (50 mg total) by mouth daily. (Patient not taking: Reported on 12/31/2023) 90 tablet 3   No current facility-administered medications for this visit.     Review of Systems    She denies chest pain, dyspnea, pnd, orthopnea, n, v, dizziness, syncope, edema, weight gain, or early satiety. All other systems reviewed and are otherwise negative except as noted above.     Physical Exam    VS:  BP 108/72 (BP Location: Right Arm, Patient Position: Sitting)   Pulse (!) 54   Ht 5' 7 (1.702 m)   Wt 195 lb 6.4 oz (88.6 kg)   SpO2 97%   BMI 30.60 kg/m  GEN: Well nourished, well developed, in no acute distress. HEENT: normal. Neck: Supple, no JVD, carotid bruits, or masses. Cardiac: RRR, no murmurs, rubs, or gallops. No clubbing, cyanosis, edema.  Radials/DP/PT 2+ and equal bilaterally.  Respiratory:  Respirations regular and unlabored, clear to auscultation bilaterally. GI: Soft, nontender, nondistended, BS + x 4. MS: no deformity or atrophy. Skin: warm and dry, no rash. Neuro:  Strength and sensation are intact. Psych: Normal affect.  Accessory Clinical Findings    ECG personally reviewed by me today - EKG Interpretation Date/Time:  Monday December 31 2023 14:39:04 EDT Ventricular Rate:  54 PR Interval:  218 QRS Duration:  88 QT Interval:  512 QTC Calculation: 485 R Axis:   21  Text Interpretation: Sinus bradycardia with 1st degree A-V block When compared with ECG of 19-May-2021 10:48, No significant change was found Confirmed by Daneen Perkins ([id number]) on 12/31/2023 2:47:24 PM  - no acute changes.   Lab  Results  Component Value Date   WBC 6.3 10/10/2023   HGB 13.3 10/10/2023   HCT 38.8 10/10/2023   MCV 90 10/10/2023   PLT 268 10/10/2023   Lab Results  Component Value Date   CREATININE 1.24 (H) 10/10/2023   BUN 18 10/10/2023   NA 143 10/10/2023   K 3.6 10/10/2023   CL 100 10/10/2023   CO2 27 10/10/2023   Lab Results  Component Value Date   ALT 15 11/23/2015   AST 17 11/23/2015   ALKPHOS 65 11/23/2015   BILITOT 0.4 11/23/2015   Lab Results  Component Value Date   CHOL 154 01/30/2017   HDL 60 01/30/2017   LDLCALC 73 01/30/2017   LDLDIRECT 118.9 12/07/2009   TRIG 107 01/30/2017   CHOLHDL 2.6 01/30/2017    Lab Results  Component Value Date   HGBA1C 6.1 (H) 09/12/2022    Assessment & Plan     1. Paroxysmal atrial fibrillation: S/p ablation in 2022. Echocardiogram in 12/2014 showed EF 60 to 65%, normal LV function, no RWMA, normal RV systolic function, mild mitral valve regurgitation.  Maintaining sinus rhythm.  She reports a few episodes of breakthrough atrial fibrillation, each lasting less than 24 hours, most recently was during her colonoscopy prep.  She does have a history of GI bleed, anemia. Hemoglobin was stable at 13.3 in 09/2023.  She denies bleeding.  Continue propranolol , flecainide , diltiazem , Eliquis .  2. Aortic atherosclerosis: Coronary calcium  score in 2016 was 0.  Stable with no anginal symptoms. No indication for ischemic evaluation.   3. Mitral valve regurgitation: Mild on echo in 2016. Euvolemic and well compensated on exam.  Consider repeat echo as clinically indicated.  4. Hypertension: BP well controlled. Continue current antihypertensive regimen.   5. Hyperlipidemia: LDL was 123 in 10/2023.  Declines statin therapy.  6. OSA: Nonadherent  to CPAP.   7. Disposition: Follow-up in 1 year, sooner if needed.      Damien JAYSON Braver, NP 01/01/2024, 1:03 PM

## 2023-12-31 NOTE — Patient Instructions (Signed)
 Medication Instructions:  Your physician recommends that you continue on your current medications as directed. Please refer to the Current Medication list given to you today.  *If you need a refill on your cardiac medications before your next appointment, please call your pharmacy*  Lab Work: NONE ordered at this time of appointment   Testing/Procedures: NONE ordered at this time of appointment   Follow-Up: At [facility], you and your health needs are our priority.  As part of our continuing mission to provide you with exceptional heart care, our providers are all part of one team.  This team includes your primary Cardiologist (physician) and Advanced Practice Providers or APPs (Physician Assistants and Nurse Practitioners) who all work together to provide you with the care you need, when you need it.  Your next appointment:   1 year(s)  Provider:   Vina Gull, MD    We recommend signing up for the patient portal called MyChart.  Sign up information is provided on this After Visit Summary.  MyChart is used to connect with patients for Virtual Visits (Telemedicine).  Patients are able to view lab/test results, encounter notes, upcoming appointments, etc.  Non-urgent messages can be sent to your provider as well.   To learn more about what you can do with MyChart, go to [email or web address].

## 2024-01-01 ENCOUNTER — Encounter: Payer: Self-pay | Admitting: Nurse Practitioner

## 2024-01-17 ENCOUNTER — Other Ambulatory Visit: Payer: Self-pay

## 2024-01-17 ENCOUNTER — Other Ambulatory Visit (HOSPITAL_COMMUNITY): Payer: Self-pay

## 2024-01-17 MED ORDER — CHLORTHALIDONE 25 MG PO TABS
25.0000 mg | ORAL_TABLET | Freq: Every day | ORAL | 3 refills | Status: DC
  Filled 2024-01-17: qty 90, 90d supply, fill #0

## 2024-01-21 ENCOUNTER — Other Ambulatory Visit (HOSPITAL_COMMUNITY): Payer: Self-pay

## 2024-01-21 MED ORDER — CHLORTHALIDONE 25 MG PO TABS: 25.0000 mg | ORAL_TABLET | Freq: Every day | ORAL | 3 refills | Status: AC

## 2024-01-21 NOTE — Addendum Note (Signed)
 Addended by: MEMORY DELON POUR on: 01/21/2024 12:47 PM   Modules accepted: Orders

## 2024-01-31 ENCOUNTER — Other Ambulatory Visit: Payer: Self-pay

## 2024-01-31 MED ORDER — FLECAINIDE ACETATE 50 MG PO TABS: 50.0000 mg | ORAL_TABLET | Freq: Two times a day (BID) | ORAL | 3 refills | Status: AC

## 2024-04-09 ENCOUNTER — Telehealth: Payer: Self-pay | Admitting: Internal Medicine

## 2024-04-09 NOTE — Telephone Encounter (Signed)
 Pt c/o medication issue:  1. Name of Medication:   diltiazem  (CARDIZEM  CD) 120 MG 24 hr capsule   2. How are you currently taking this medication (dosage and times per day)?   As prescribed  3. Are you having a reaction (difficulty breathing--STAT)?   4. What is your medication issue?   Patient noted she has changed PCPs and wants a call back regarding having Dr. Okey prescribe this medication for her.  Patient noted she has 2 days left of this medication and will need a refill sent to [facility] [address] - [place], [place] - [address]

## 2024-04-09 NOTE — Telephone Encounter (Signed)
 Call to patient to discuss concerns. Patient reports she changed PCP's, her new PCP is an NP and does not feel comfortable refilling this medication. Patient states her BP at home is 127/68 and HR is 72.

## 2024-04-09 NOTE — Telephone Encounter (Signed)
 OK to refill

## 2024-04-10 MED ORDER — DILTIAZEM HCL ER COATED BEADS 120 MG PO CP24
120.0000 mg | ORAL_CAPSULE | Freq: Every day | ORAL | 3 refills | Status: AC
Start: 2024-04-10 — End: ?

## 2024-04-10 NOTE — Telephone Encounter (Signed)
 Refill for diltiazem  sent to [facility] pharmacy as requested by patient. Patient notified and verbalized understanding.

## 2024-04-10 NOTE — Addendum Note (Signed)
 Addended by: VICCI ROXIE CROME on: 04/10/2024 09:48 AM   Modules accepted: Orders

## 2024-05-27 ENCOUNTER — Telehealth: Payer: Self-pay | Admitting: Cardiology

## 2024-05-27 NOTE — Telephone Encounter (Signed)
°*  STAT* If patient is at the pharmacy, call can be transferred to refill team.   1. Which medications need to be refilled? (please list name of each medication and dose if known) apixaban  (ELIQUIS ) 5 MG TABS tablet   2. Which pharmacy/location (including street and city if local pharmacy) is medication to be sent to?  [facility] [address] - [place], [place] - [address]    3. Do they need a 30 day or 90 day supply? 90

## 2024-05-28 ENCOUNTER — Other Ambulatory Visit: Payer: Self-pay

## 2024-05-28 DIAGNOSIS — I480 Paroxysmal atrial fibrillation: Secondary | ICD-10-CM

## 2024-05-28 MED ORDER — APIXABAN 5 MG PO TABS: 5.0000 mg | ORAL_TABLET | Freq: Two times a day (BID) | ORAL | 1 refills | Status: AC

## 2024-05-28 NOTE — Telephone Encounter (Signed)
 Prescription refill request for Eliquis  received. Indication:afib Last office visit:7/25 Scr: 1.01  10/25 Age:73 Weight:88.6  kg  Prescription refilled
